# Patient Record
Sex: Male | Born: 1965 | Race: White | Hispanic: No | Smoking: Never smoker
Health system: Southern US, Community
[De-identification: ages and names within clinical notes are randomized; demographics above are authoritative.]

## PROBLEM LIST (undated history)

## (undated) DIAGNOSIS — M1990 Unspecified osteoarthritis, unspecified site: Secondary | ICD-10-CM

## (undated) DIAGNOSIS — K219 Gastro-esophageal reflux disease without esophagitis: Secondary | ICD-10-CM

## (undated) DIAGNOSIS — T4145XA Adverse effect of unspecified anesthetic, initial encounter: Secondary | ICD-10-CM

## (undated) DIAGNOSIS — G4733 Obstructive sleep apnea (adult) (pediatric): Principal | ICD-10-CM

## (undated) DIAGNOSIS — T7840XA Allergy, unspecified, initial encounter: Secondary | ICD-10-CM

## (undated) DIAGNOSIS — Z9989 Dependence on other enabling machines and devices: Principal | ICD-10-CM

## (undated) DIAGNOSIS — G5600 Carpal tunnel syndrome, unspecified upper limb: Secondary | ICD-10-CM

## (undated) DIAGNOSIS — R0683 Snoring: Secondary | ICD-10-CM

## (undated) DIAGNOSIS — Z1509 Genetic susceptibility to other malignant neoplasm: Secondary | ICD-10-CM

## (undated) HISTORY — PX: GALLBLADDER SURGERY: SHX652

## (undated) HISTORY — PX: ANAL FISSURE REPAIR: SHX2312

## (undated) HISTORY — DX: Dependence on other enabling machines and devices: Z99.89

## (undated) HISTORY — DX: Obstructive sleep apnea (adult) (pediatric): G47.33

## (undated) HISTORY — PX: FRACTURE SURGERY: SHX138

## (undated) HISTORY — DX: Carpal tunnel syndrome, unspecified upper limb: G56.00

## (undated) HISTORY — DX: Allergy, unspecified, initial encounter: T78.40XA

## (undated) HISTORY — DX: Genetic susceptibility to other malignant neoplasm: Z15.09

## (undated) HISTORY — DX: Snoring: R06.83

---

## 1997-08-05 HISTORY — PX: CHOLECYSTECTOMY: SHX55

## 2003-08-06 HISTORY — PX: EYE SURGERY: SHX253

## 2003-10-10 ENCOUNTER — Ambulatory Visit (HOSPITAL_COMMUNITY): Admission: RE | Admit: 2003-10-10 | Discharge: 2003-10-10 | Payer: Self-pay | Admitting: Internal Medicine

## 2005-01-19 ENCOUNTER — Ambulatory Visit (HOSPITAL_COMMUNITY): Admission: RE | Admit: 2005-01-19 | Discharge: 2005-01-19 | Payer: Self-pay | Admitting: Family Medicine

## 2005-03-28 ENCOUNTER — Emergency Department (HOSPITAL_COMMUNITY): Admission: EM | Admit: 2005-03-28 | Discharge: 2005-03-28 | Payer: Self-pay | Admitting: *Deleted

## 2006-07-14 ENCOUNTER — Ambulatory Visit (HOSPITAL_COMMUNITY): Admission: RE | Admit: 2006-07-14 | Discharge: 2006-07-14 | Payer: Self-pay | Admitting: General Surgery

## 2007-03-04 ENCOUNTER — Ambulatory Visit (HOSPITAL_COMMUNITY): Admission: RE | Admit: 2007-03-04 | Discharge: 2007-03-04 | Payer: Self-pay | Admitting: Family Medicine

## 2008-02-29 ENCOUNTER — Emergency Department (HOSPITAL_COMMUNITY): Admission: EM | Admit: 2008-02-29 | Discharge: 2008-02-29 | Payer: Self-pay | Admitting: Emergency Medicine

## 2008-04-29 ENCOUNTER — Emergency Department (HOSPITAL_COMMUNITY): Admission: EM | Admit: 2008-04-29 | Discharge: 2008-04-29 | Payer: Self-pay | Admitting: Emergency Medicine

## 2009-06-11 ENCOUNTER — Emergency Department (HOSPITAL_COMMUNITY): Admission: EM | Admit: 2009-06-11 | Discharge: 2009-06-11 | Payer: Self-pay | Admitting: Emergency Medicine

## 2010-06-04 ENCOUNTER — Ambulatory Visit (HOSPITAL_COMMUNITY): Admission: RE | Admit: 2010-06-04 | Discharge: 2010-06-04 | Payer: Self-pay | Admitting: Internal Medicine

## 2010-08-26 ENCOUNTER — Encounter: Payer: Self-pay | Admitting: Family Medicine

## 2010-12-21 NOTE — Op Note (Signed)
Jacob House, Jacob House                           ACCOUNT NO.:  [id number]   MEDICAL RECORD NO.:  [id number]                   PATIENT TYPE:  AMB   LOCATION:  DAY                                  FACILITY:  [facility]   PHYSICIAN:  Lionel December, M.D.                 DATE OF BIRTH:  November 06, 1965   DATE OF PROCEDURE:  10/10/2003  DATE OF DISCHARGE:                                 OPERATIVE REPORT   PROCEDURE:  Total colonoscopy.   INDICATIONS FOR PROCEDURE:  Jacob House is a 45 year old Caucasian male who had  a small tubular adenoma removed on his previous colonoscopy in November of  1997.  He does not have any GI symptoms other than occasional hematochezia  in the form of blood on the tissue felt to be secondary to hemorrhoids.  His  family history is strongly positive for colon carcinoma.  He had mother and  a paternal uncle with colon cancer in their 72s.  He is therefore at high  risk for CRC.   The procedure and risks were reviewed with the patient, and informed consent  was obtained.   PREOPERATIVE MEDICATIONS:  Demerol 50 mg IV, Versed 13 mg IV.   FINDINGS:  The procedure was performed in the endoscopy suite.  The  patient's vital signs and O2 saturations were monitored during the procedure  and remained stable.  The patient was placed in the left lateral recumbent  position and rectal examination performed.  No abnormality noted on external  or digital exam.  The Olympus videoscope was placed into the rectum and  advanced into the region of the sigmoid colon and beyond.  The preparation  was excellent, except he had some stool coating the cecal mucosa which had  to be washed away.  There was a small polyp to the right of the appendiceal  orifice which was ablated by cold biopsy.  The ileocecal valve was well-seen  and was normal.  As the scope was withdrawn, the colonic mucosa was once  again carefully examined and was normal throughout.  The rectal mucosa  similarly was normal.  The  scope was retroflexed to examine the anorectal  junction, and small hemorrhoids were noted below the dentate line and a  prominent anal papilla.  The endoscope was straightened and withdrawn.  The  patient tolerated the procedure well.   FINAL DIAGNOSES:  1. Small cecal polyp that was ablated by cold biopsy.  2. Small external hemorrhoids with prominent anal papilla.   RECOMMENDATIONS:  1. Standard instructions given.  2. I will be contacting Leonette Most with the biopsy results.  Given his personal     history and family history of colon carcinoma, I would recommend that he     should return for repeat colonoscopy in five years from now.      ___________________________________________  Lionel December, M.D.   NR/MEDQ  D:  10/10/2003  T:  10/10/2003  Job:  [id number]   cc:   Corrie Mckusick, M.D.  [address] [place]urell Josephs. [place]  [place]  [place] [address]  Fax: [phone]

## 2010-12-21 NOTE — Op Note (Signed)
NAMEMACDONALD, RIGOR NO.:  [id number]   MEDICAL RECORD NO.:  [id number]          PATIENT TYPE:  AMB   LOCATION:  SDS                          FACILITY:  [facility]   PHYSICIAN:  Angelia Mould. Derrell Lolling, M.D.DATE OF BIRTH:  04-07-1966   DATE OF PROCEDURE:  07/14/2006  DATE OF DISCHARGE:                               OPERATIVE REPORT   PREOPERATIVE DIAGNOSIS:  Chronic anal fissure.   POSTOPERATIVE DIAGNOSES:  1. Chronic anal fissure.  2. Pruritus ani.   OPERATION PERFORMED:  1. Rigid proctoscopy.  2. Lateral internal sphincterotomy.  3. Anal stretch.   SURGEON:  Angelia Mould. Derrell Lolling, M.D.   OPERATIVE INDICATIONS:  This is a 45 year old white man who has a long  history of intermittent rectal pain and low-volume bright red bleeding.  His pain is typically during and right after a bowel movement with a  little bit of blood.  He had a colonoscopy in 2005 and he thinks he was  told he had polyps and is supposed to get another colonoscopy in about a  year.  On exam, there are no hemorrhoids.  He had a very minor pruritus  ani.  I could visualize a small anal fissure posterior midline and  probably a smaller one anterior and midline, no sentinel tags.  The we  have attempted medical therapy with stool softeners, perianal hygiene,  diltiazem cream  q.i.d., and he continues to be symptomatic.  The  pruritus ani was improved, but the pain and the bleeding did not.  We  talked about surgical options and he wanted to try for repair.  He has  been counseled as an outpatient.   OPERATIVE TECHNIQUE:  Following the induction of general LMA anesthetic,  the patient was placed in the dorsal lithotomy position.  Digital rectal  exam revealed normal to slightly increased sphincter tone.  He still had  some residual pruritus ani.  There was no mass.  The perianal tissues  were a little bit friable and bled and tore easily.  Rigid proctoscopy  was carried out to about 20 cm.  I saw no  abnormalities of the rectal  mucosa.  Specifically no polyps, no tumors, no inflammatory change.  The  colon was acutely angulated at 20 cm and I did not go further.   The perianal area was then prepped and draped in a sterile fashion.  External exam again confirmed mild circumferential pruritus ani and  small anal fissure anterior midline posterior midline.  Anoscope was  inserted and there were no other anal canal lesions.  I positioned the  anoscope to expose the left lateral wall.  I was then able to palpate  the intersphincteric groove between the internal and external sphincter  muscles.  I then took an 11 knife blade and inserted it percutaneously  through the anoderm in between the internal and external sphincter and  then turned the knife and divided the internal sphincter.  I left the  mucosa intact.  I removed the knife.  I then slowly dilated the anal  canal until I could insert four fingers.  I held  pressure on the  sphincterotomy site for about 5-10 minutes and this provided good  hemostasis and no hematoma was apparent.  I used the cautery to  cauterize the anal  fissure in the posterior and anterior midline.  I then observed for  about another 5 minutes and saw no bleeding, placed an external bandage.  The patient was taken to the recovery room in stable condition.  Estimated blood loss was about 10 mL.  Complications:  None.  Sponge,  needle and instrument counts were correct.      Angelia Mould. Derrell Lolling, M.D.  Electronically Signed     HMI/MEDQ  D:  07/14/2006  T:  07/14/2006  Job:  [id number]   cc:   Corrie Mckusick, M.D.  Lionel December, M.D.

## 2011-05-03 LAB — POCT I-STAT, CHEM 8
HCT: 42
Hemoglobin: 14.3
Potassium: 4
Sodium: 136
TCO2: 26

## 2011-08-17 ENCOUNTER — Ambulatory Visit (INDEPENDENT_AMBULATORY_CARE_PROVIDER_SITE_OTHER): Payer: BC Managed Care – PPO

## 2011-08-17 DIAGNOSIS — M7710 Lateral epicondylitis, unspecified elbow: Secondary | ICD-10-CM

## 2012-03-28 ENCOUNTER — Ambulatory Visit (INDEPENDENT_AMBULATORY_CARE_PROVIDER_SITE_OTHER): Payer: BC Managed Care – PPO | Admitting: Family Medicine

## 2012-03-28 VITALS — BP 114/74 | HR 72 | Temp 98.0°F | Resp 18 | Ht 66.5 in | Wt 237.0 lb

## 2012-03-28 DIAGNOSIS — M79605 Pain in left leg: Secondary | ICD-10-CM

## 2012-03-28 DIAGNOSIS — M545 Low back pain, unspecified: Secondary | ICD-10-CM

## 2012-03-28 MED ORDER — OXAPROZIN 600 MG PO TABS: ORAL_TABLET | ORAL | Status: DC

## 2012-03-28 MED ORDER — PREDNISONE 20 MG PO TABS: ORAL_TABLET | ORAL | Status: AC

## 2012-03-28 MED ORDER — METHOCARBAMOL 750 MG PO TABS: ORAL_TABLET | ORAL | Status: DC

## 2012-03-28 NOTE — Progress Notes (Signed)
Subjective: Patient has a long history of episodic low back pain. He was last treated for it about 8 months ago. It calmed down fairly quickly at that time. The past couple days he's had pain in his left low back, at times radiating down his left leg. No specific injury, though he did do a lot of stairs on Thursday.  Objective: Overweight white male in no acute distress. Abdomen soft. Straight leg raise test negative. Fair range of motion of his back.  Assessment Low back pain with left radiculopathy  Plan: Prednisone taper Daypro twice a day Robaxin 1 twice a day and 2 at bedtime Return if worse

## 2012-03-28 NOTE — Patient Instructions (Signed)
Take medications as directed  Return if problems or not doing better or getting worse.

## 2012-06-12 ENCOUNTER — Ambulatory Visit (INDEPENDENT_AMBULATORY_CARE_PROVIDER_SITE_OTHER): Payer: BC Managed Care – PPO | Admitting: Family Medicine

## 2012-06-12 ENCOUNTER — Ambulatory Visit: Payer: BC Managed Care – PPO

## 2012-06-12 VITALS — BP 129/77 | HR 71 | Temp 97.9°F | Resp 16 | Ht 67.0 in | Wt 239.0 lb

## 2012-06-12 DIAGNOSIS — M79602 Pain in left arm: Secondary | ICD-10-CM

## 2012-06-12 DIAGNOSIS — G5600 Carpal tunnel syndrome, unspecified upper limb: Secondary | ICD-10-CM

## 2012-06-12 DIAGNOSIS — M79609 Pain in unspecified limb: Secondary | ICD-10-CM

## 2012-06-12 DIAGNOSIS — T148XXA Other injury of unspecified body region, initial encounter: Secondary | ICD-10-CM

## 2012-06-12 MED ORDER — METHOCARBAMOL 750 MG PO TABS: ORAL_TABLET | ORAL | Status: DC

## 2012-06-12 MED ORDER — OXAPROZIN 600 MG PO TABS: ORAL_TABLET | ORAL | Status: DC

## 2012-06-12 NOTE — Progress Notes (Signed)
[facility]:  Office Visit  Chief Complaint:  Chief Complaint  Patient presents with  . Arm Pain    left arm pain x 3-4 days  . Hand Problem    right hand numbness x a couple weeks    HPI: Jacob House is a 46 y.o. male who complains of  Left arm pain above and below elbow joint. It is described as mostly aching and throbbing, however can be sharp at times for 4 days. He denies any shoulder issues, he sleeps on both sides and has not had any problems before this. The discomfort makes it hard to sleep. He has NKI, has not lifted anything heavy, has not tried any new exercises. He has full ROM, denies any weakness. He does have a history of carpal tunnel bilaterally , obtained through nerve conduction studies,  with numbness mostly  in right index finger. Has tried robaxin with some relief. 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 Patient wants to know if he can get xrays.   Past Medical History  Diagnosis Date  . Carpal tunnel syndrome    Past Surgical History  Procedure Date  . Eye surgery   . Gallbladder surgery   . Anal fissure repair    History   Social History  . Marital Status: Single    Spouse Name: N/A    Number of Children: N/A  . Years of Education: N/A   Occupational History  . machine operator    Social History Main Topics  . Smoking status: Never Smoker   . Smokeless tobacco: None  . Alcohol Use: 0.5 - 1.0 oz/week    1-2 drink(s) per week  . Drug Use: No  . Sexually Active: Yes    Birth Control/ Protection: Condom     Comment: 1 sexual partner in last year   Other Topics Concern  . None   Social History Narrative  . None   Family History  Problem Relation Age of Onset  . Cancer Mother   . Cancer Father   . Cancer Maternal Grandmother   . Cancer Maternal Grandfather    No Known Allergies Prior to Admission medications   Medication Sig Start Date End  Date Taking? Authorizing Provider  methocarbamol (ROBAXIN-750) 750 MG tablet One twice daily morning and afternoon and 2 at bedtime for muscle relaxant 03/28/12   Peyton Najjar, MD  oxaprozin (DAYPRO) 600 MG tablet One twice daily for back 03/28/12 03/28/13  Peyton Najjar, MD     ROS: The patient denies fevers, chills, night sweats, unintentional weight loss, chest pain, palpitations, wheezing, dyspnea on exertion, nausea, vomiting, abdominal pain, dysuria, hematuria, melena, + numbness, or tingling.   All other systems have been reviewed and were otherwise negative with the exception of those mentioned in the HPI and as above.    PHYSICAL EXAM: Filed Vitals:   06/12/12 1552  BP: 129/77  Pulse: 71  Temp: 97.9 F (36.6 C)  Resp: 16   Filed Vitals:   06/12/12 1552  Height: 5\' 7"  (1.702 m)  Weight: 239 lb (108.41 kg)   Body mass index is 37.43 kg/(m^2).  General: Alert, no acute distress, very pleasant HEENT:  Normocephalic, atraumatic, oropharynx patent.  Cardiovascular:  Regular rate and rhythm, no rubs murmurs or gallops.  No Carotid bruits, radial pulse intact. No pedal edema.  Respiratory: Clear to auscultation bilaterally.  No wheezes, rales, or rhonchi.  No cyanosis, no use of accessory musculature GI: No organomegaly, abdomen is soft and non-tender, positive bowel sounds.  No masses. Skin: No rashes. Neurologic: Facial musculature symmetric. Psychiatric: Patient is appropriate throughout our interaction. Lymphatic: No cervical lymphadenopathy Musculoskeletal: Gait intact. Neck ROM -nl, negative Spurling Right hand-nl ROM, 5/5 strength,  + right index finger decrease sensation to light touch, pin prick Left shoulder-nl exam Left humerus and forearm-  Full ROM, 5/5 strength, + radial pulse, no deformities, stable to varus and valgus stress at elbow joint, + Tender with palpation at mid left humerus, proximal left forearm Left elbow-nl exam, + DTR, 2/2 , 5/5 stregnth, ROM  intact, neg Tinel's test   LABS: Results for orders placed during the hospital encounter of 02/29/08  POCT I-STAT, CHEM 8      Component Value Range   Sodium 136     Potassium 4.0     Chloride 102     BUN 9     Creatinine, Ser 1.0     Glucose, Bld 122 (*)    Calcium, Ion 1.10 (*)    TCO2 26     Hemoglobin 14.3     HCT 42.0       EKG/XRAY:   Primary read interpreted by Dr. Conley Rolls at [facility]. No fractures or subluxation of radius, ulna, humerus, elbow   ASSESSMENT/PLAN: Encounter Diagnoses  Name Primary?  . Arm pain, left Yes  . Carpal tunnel syndrome   . Sprain and strain    Muscle sprain/strain ? Associated with carpal tunnel syndrome.  Robaxin, Daypro  rx since they both seem to help Wrist splints prn bilaterally prn, he will get some from work or OTC, advise to try splints at night  F/u prn    Kayci Belleville PHUONG, DO 06/13/2012 8:08 AM

## 2012-06-13 DIAGNOSIS — G5600 Carpal tunnel syndrome, unspecified upper limb: Secondary | ICD-10-CM | POA: Insufficient documentation

## 2012-08-06 ENCOUNTER — Ambulatory Visit (INDEPENDENT_AMBULATORY_CARE_PROVIDER_SITE_OTHER): Payer: BC Managed Care – PPO | Admitting: Physician Assistant

## 2012-08-06 VITALS — BP 122/72 | HR 82 | Temp 97.9°F | Resp 16 | Ht 67.0 in | Wt 240.4 lb

## 2012-08-06 DIAGNOSIS — L02519 Cutaneous abscess of unspecified hand: Secondary | ICD-10-CM

## 2012-08-06 DIAGNOSIS — K635 Polyp of colon: Secondary | ICD-10-CM | POA: Insufficient documentation

## 2012-08-06 DIAGNOSIS — L03019 Cellulitis of unspecified finger: Secondary | ICD-10-CM

## 2012-08-06 DIAGNOSIS — M79609 Pain in unspecified limb: Secondary | ICD-10-CM

## 2012-08-06 MED ORDER — DOXYCYCLINE HYCLATE 100 MG PO CAPS: 100.0000 mg | ORAL_CAPSULE | Freq: Two times a day (BID) | ORAL | Status: DC

## 2012-08-06 NOTE — Patient Instructions (Signed)
Warm compresses for 15-20 minutes, 3-4 times daily.

## 2012-08-06 NOTE — Progress Notes (Signed)
  Subjective:    Patient ID: Jacob House, male    DOB: 14-Jan-1966, 47 y.o.   MRN: [id number]  HPI This 47 y.o. male presents for evaluation of RIGHT middle finger pain and swelling.  First began about 2 weeks ago, seemed to improve, then worsened again about 4 days ago. No drainage from the site.  No specific trauma or injury.  Review of Systems No fever, chills.    Objective:   Physical Exam  Vitals reviewed. Constitutional: He is oriented to person, place, and time. Vital signs are normal. He appears well-developed and well-nourished. He is active and cooperative. No distress.  HENT:  Head: Normocephalic and atraumatic.  Eyes: No scleral icterus.  Cardiovascular: Normal rate.   Pulmonary/Chest: Effort normal.  Musculoskeletal: He exhibits edema and tenderness.       Hands: Neurological: He is alert and oriented to person, place, and time.  Skin: Skin is warm and dry. No rash noted. He is not diaphoretic. There is erythema. No cyanosis. Nails show no clubbing.       RIGHT middle finger noted for distal erythema and edema along the nail fold (radial aspect).  Cuticles are very dry and cracked.  Possible fluctuance, no purulence can be expressed.  Psychiatric: He has a normal mood and affect. His behavior is normal.   PROCEDURE: VCO.  Area cleansed with alcohol pad. 23 gauge needle inserted into the center of the swollen area.  No purulence aspirated or expressed.  Antibiotic ointment and bandaid applied.     Assessment & Plan:   1. Cellulitis, finger  doxycycline (VIBRAMYCIN) 100 MG capsule   Warm compresses.  Anticipatory guidance provided.  RTC if worsens/persists.

## 2012-09-12 ENCOUNTER — Ambulatory Visit (INDEPENDENT_AMBULATORY_CARE_PROVIDER_SITE_OTHER): Payer: BC Managed Care – PPO | Admitting: Emergency Medicine

## 2012-09-12 ENCOUNTER — Encounter: Payer: Self-pay | Admitting: Emergency Medicine

## 2012-09-12 VITALS — BP 117/77 | HR 81 | Temp 98.0°F | Resp 16 | Ht 66.5 in | Wt 234.4 lb

## 2012-09-12 DIAGNOSIS — G5600 Carpal tunnel syndrome, unspecified upper limb: Secondary | ICD-10-CM

## 2012-09-12 DIAGNOSIS — R079 Chest pain, unspecified: Secondary | ICD-10-CM

## 2012-09-12 DIAGNOSIS — G5603 Carpal tunnel syndrome, bilateral upper limbs: Secondary | ICD-10-CM

## 2012-09-12 MED ORDER — NAPROXEN SODIUM 550 MG PO TABS: 550.0000 mg | ORAL_TABLET | Freq: Two times a day (BID) | ORAL | Status: DC

## 2012-09-12 NOTE — Progress Notes (Signed)
[facility] [address] [place] [address] [phone]  Date:  09/12/2012   Name:  Jacob House   DOB:  October 28, 1965   MRN:  [id number]  PCP:  No primary provider on file.    Chief Complaint: Elbow Pain   History of Present Illness:  Jacob House is a 47 y.o. very pleasant male patient who presents with the following:  History of carpal tunnel syndrome and not using any splint or meds for treatment.  Does not wish treatment for it.  Now has pain into forearm while sleeping associated with numbness in arm.  No weakness in arm.  Experiencing mid chest discomfort over past two weeks or so that is characterized as a tightness.  Lasts a minute or two. Never more.  Not associated with activity or emotional upset.  No palpitations or tachycardia, wheezing, shortness of breath, diaphoresis, nausea, radiation or other complaints.  Pain free now.  Non smoker, no hypertension, hypercholesterolemia, diabetes, or family history of CAD.   Patient Active Problem List  Diagnosis  . Carpal tunnel syndrome  . Colon polyps    Past Medical History  Diagnosis Date  . Carpal tunnel syndrome     Past Surgical History  Procedure Laterality Date  . Eye surgery    . Gallbladder surgery    . Anal fissure repair      History  Substance Use Topics  . Smoking status: Never Smoker   . Smokeless tobacco: Never Used  . Alcohol Use: 0 - .5 oz/week    0-1 drink(s) per week    Family History  Problem Relation Age of Onset  . Cancer Mother 57    colon, stomach  . Cancer Father     testicular  . Cancer Maternal Grandmother     ?colon  . Cancer Maternal Grandfather   . Cancer Maternal Uncle     colon  . Cancer Maternal Uncle     colon    No Known Allergies  Medication list has been reviewed and updated.  Current Outpatient Prescriptions on File Prior to Visit  Medication Sig Dispense Refill  . Diclofenac Sodium (PENNSAID) 1.5 % SOLN Place onto the skin.      Marland Kitchen  doxycycline (VIBRAMYCIN) 100 MG capsule Take 1 capsule (100 mg total) by mouth 2 (two) times daily.  20 capsule  0  . methocarbamol (ROBAXIN-750) 750 MG tablet One tab PO BID  30 tablet  1  . oxaprozin (DAYPRO) 600 MG tablet One twice daily for back  30 tablet  1   No current facility-administered medications on file prior to visit.    Review of Systems:  As per HPI, otherwise negative.   Physical Examination: Filed Vitals:   09/12/12 1812  BP: 117/77  Pulse: 81  Temp: 98 F (36.7 C)  Resp: 16   Filed Vitals:   09/12/12 1812  Height: 5' 6.5" (1.689 m)  Weight: 234 lb 6.4 oz (106.323 kg)   Body mass index is 37.27 kg/(m^2). Ideal Body Weight: Weight in (lb) to have BMI = 25: 156.9  GEN: WDWN, NAD, Non-toxic, A & O x 3 HEENT: Atraumatic, Normocephalic. Neck supple. No masses, No LAD. Ears and Nose: No external deformity. CV: RRR, No M/G/R. No JVD. No thrill. No extra heart sounds. PULM: CTA B, no wheezes, crackles, rhonchi. No retractions. No resp. distress. No accessory muscle use. ABD: S, NT, ND, +BS. No rebound. No HSM. EXTR: No c/c/e  Tinnell positive NEURO Normal  gait.  PSYCH: Normally interactive. Conversant. Not depressed or anxious appearing.  Calm demeanor.     Assessment and Plan:  Carpal tunnel syndrome Wear splint Consider surgery Anaprox DS Chest discomfort. Normal EKG Follow up with FMD  Carmelina Dane, MD

## 2012-09-12 NOTE — Patient Instructions (Addendum)

## 2012-09-29 ENCOUNTER — Emergency Department (HOSPITAL_COMMUNITY)
Admission: EM | Admit: 2012-09-29 | Discharge: 2012-09-30 | Disposition: A | Discharge status: Home / Self Care | Payer: BC Managed Care – PPO | Attending: Emergency Medicine | Admitting: Emergency Medicine

## 2012-09-29 ENCOUNTER — Emergency Department (HOSPITAL_COMMUNITY): Payer: BC Managed Care – PPO

## 2012-09-29 ENCOUNTER — Encounter (HOSPITAL_COMMUNITY): Payer: Self-pay | Admitting: *Deleted

## 2012-09-29 DIAGNOSIS — Z8669 Personal history of other diseases of the nervous system and sense organs: Secondary | ICD-10-CM | POA: Insufficient documentation

## 2012-09-29 DIAGNOSIS — Z79899 Other long term (current) drug therapy: Secondary | ICD-10-CM | POA: Insufficient documentation

## 2012-09-29 DIAGNOSIS — R079 Chest pain, unspecified: Secondary | ICD-10-CM | POA: Insufficient documentation

## 2012-09-29 LAB — CBC
HCT: 41.8 % (ref 39.0–52.0)
Hemoglobin: 14.3 g/dL (ref 13.0–17.0)
MCH: 28.4 pg (ref 26.0–34.0)
MCHC: 34.2 g/dL (ref 30.0–36.0)
MCV: 83.1 fL (ref 78.0–100.0)
Platelets: 281 10*3/uL (ref 150–400)
RBC: 5.03 MIL/uL (ref 4.22–5.81)
RDW: 13 % (ref 11.5–15.5)
WBC: 6.9 10*3/uL (ref 4.0–10.5)

## 2012-09-29 LAB — BASIC METABOLIC PANEL
BUN: 11 mg/dL (ref 6–23)
CO2: 28 mEq/L (ref 19–32)
Calcium: 9.6 mg/dL (ref 8.4–10.5)
Chloride: 100 mEq/L (ref 96–112)
Creatinine, Ser: 0.95 mg/dL (ref 0.50–1.35)
GFR calc Af Amer: >90 mL/min (ref >90)
GFR calc non Af Amer: >90 mL/min (ref >90)
Glucose, Bld: 112 mg/dL — ABNORMAL HIGH (ref 70–99)
Potassium: 3.9 mEq/L (ref 3.5–5.1)
Sodium: 138 mEq/L (ref 135–145)

## 2012-09-29 LAB — TROPONIN I: Troponin I: <0.3 ng/mL (ref <0.30)

## 2012-09-29 MED ORDER — NITROGLYCERIN 0.4 MG SL SUBL
0.4000 mg | SUBLINGUAL_TABLET | SUBLINGUAL | Status: DC | PRN
  Administered 2012-09-29: 0.4 mg via SUBLINGUAL
  Filled 2012-09-29: qty 25

## 2012-09-29 MED ORDER — GI COCKTAIL ~~LOC~~
30.0000 mL | Freq: Once | ORAL | Status: AC
  Administered 2012-09-29: 30 mL via ORAL
  Filled 2012-09-29: qty 30

## 2012-09-29 NOTE — ED Notes (Addendum)
Pt reports chest pain that stated around 1830 tonight while at work. Pain also to the lower part of left arm. Was seen in [place] for the same a few weeks ago.

## 2012-09-29 NOTE — ED Provider Notes (Signed)
History     CSN: [id number]  Arrival date & time 09/29/12  2055   First MD Initiated Contact with Patient 09/29/12 2100      Chief Complaint  Patient presents with  . Chest Pain    (Consider location/radiation/quality/duration/timing/severity/associated sxs/prior treatment) Patient is a 47 y.o. male presenting with chest pain. The history is provided by the patient.  Chest Pain Pain location:  Substernal area Pain quality: dull   Pain radiates to:  Does not radiate Pain radiates to the back: no   Pain severity:  Mild Onset quality:  Sudden Duration:  3 hours Timing:  Intermittent Progression:  Improving Chronicity:  Recurrent (had once previously, informed to get an outpatient stress, which he has not gotten yet) Context: not breathing, no drug use, no stress and no trauma   Relieved by:  Nothing Worsened by:  Nothing tried Associated symptoms: no abdominal pain, no back pain, no cough, no fever, no heartburn, no nausea and no weakness   Risk factors: male sex   Risk factors: no diabetes mellitus, no high cholesterol, no hypertension, no immobilization, no Marfan's syndrome, no prior DVT/PE and no smoking     Past Medical History  Diagnosis Date  . Carpal tunnel syndrome     Past Surgical History  Procedure Laterality Date  . Eye surgery    . Gallbladder surgery    . Anal fissure repair      Family History  Problem Relation Age of Onset  . Cancer Mother 67    colon, stomach  . Cancer Father     testicular  . Cancer Maternal Grandmother     ?colon  . Cancer Maternal Grandfather   . Cancer Maternal Uncle     colon  . Cancer Maternal Uncle     colon    History  Substance Use Topics  . Smoking status: Never Smoker   . Smokeless tobacco: Never Used  . Alcohol Use: 0 - .5 oz/week    0-1 drink(s) per week      Review of Systems  Constitutional: Negative for fever.  Respiratory: Negative for cough.   Cardiovascular: Positive for chest pain.   Gastrointestinal: Negative for heartburn, nausea and abdominal pain.  Musculoskeletal: Negative for back pain.  Neurological: Negative for weakness.  All other systems reviewed and are negative.    Allergies  Review of patient's allergies indicates no known allergies.  Home Medications   Current Outpatient Rx  Name  Route  Sig  Dispense  Refill  . Diclofenac Sodium (PENNSAID) 1.5 % SOLN   Transdermal   Place onto the skin.         Marland Kitchen doxycycline (VIBRAMYCIN) 100 MG capsule   Oral   Take 1 capsule (100 mg total) by mouth 2 (two) times daily.   20 capsule   0   . methocarbamol (ROBAXIN-750) 750 MG tablet      One tab PO BID   30 tablet   1   . naproxen sodium (ANAPROX DS) 550 MG tablet   Oral   Take 1 tablet (550 mg total) by mouth 2 (two) times daily with a meal.   60 tablet   2   . oxaprozin (DAYPRO) 600 MG tablet      One twice daily for back   30 tablet   1     BP 136/69  Pulse 84  Temp(Src) 98.1 F (36.7 C) (Oral)  Resp 18  Ht 5\' 7"  (1.702 m)  Wt 235 lb (  106.595 kg)  BMI 36.8 kg/m2  SpO2 98%  Physical Exam  Nursing note and vitals reviewed. Constitutional: He is oriented to person, place, and time. He appears well-developed and well-nourished. No distress.  HENT:  Head: Normocephalic and atraumatic.  Mouth/Throat: No oropharyngeal exudate.  Eyes: EOM are normal. Pupils are equal, round, and reactive to light.  Neck: Normal range of motion. Neck supple.  Cardiovascular: Normal rate and regular rhythm.  Exam reveals no friction rub.   No murmur heard. Pulmonary/Chest: Effort normal and breath sounds normal. No respiratory distress. He has no wheezes. He has no rales.  Abdominal: He exhibits no distension. There is no tenderness. There is no rebound.  Musculoskeletal: Normal range of motion. He exhibits no edema.  Neurological: He is alert and oriented to person, place, and time.  Skin: He is not diaphoretic.    ED Course  Procedures  (including critical care time)  Labs Reviewed  BASIC METABOLIC PANEL - Abnormal; Notable for the following:    Glucose, Bld 112 (*)    All other components within normal limits  CBC  TROPONIN I  TROPONIN I   Dg Chest 2 View  09/29/2012  *RADIOLOGY REPORT*  Clinical Data: Chest pain  CHEST - 2 VIEW  Comparison: 03/04/2007  Findings: The cardiomediastinal silhouette is unremarkable. The lungs are clear. There is no evidence of focal airspace disease, pulmonary edema, suspicious pulmonary nodule/mass, pleural effusion, or pneumothorax. No acute bony abnormalities are identified.  IMPRESSION: No evidence of active cardiopulmonary disease.   Original Report Authenticated By: Harmon Pier, M.D.      1. Chest pain      Date: 09/29/2012  Rate: 84  Rhythm: normal sinus rhythm  QRS Axis: normal  Intervals: normal  ST/T Wave abnormalities: normal  Conduction Disutrbances:none  Narrative Interpretation:   Old EKG Reviewed: unchanged    MDM   Patient is a 47 year old male who presents with chest pain. Began while he was at work, but he is not doing anything stressful or exertional. It's a dull, nonradiating  Ache in the center of his chest. States it's 3/10. No alleviating or exacerbating factors. He has been intermittent. It does not radiate to his back. No associated nausea, shortness of breath, vomiting. He had this previously, and was told he needed a stress test.  Vitals are stable here. Exam is benign. EKG is normal sinus rhythm without any ischemic changes. He took an aspirin at work about 30 minutes ago. I will check a delta troponin and other basic labs. I will give him nitroglycerin and a GI cocktail.  He is PERC negative.  I feel he is low risk based on no risk factors for coronary disease and young age.  Delta troponin and CXR negative. Stable for discharge. Instructed to f/u with his PCP, Dr. Phillips Odor, for a stress test. Patient understands.         Elwin Mocha, MD 09/30/12  [phone]

## 2012-09-30 LAB — TROPONIN I: Troponin I: <0.3 ng/mL (ref <0.30)

## 2012-09-30 NOTE — ED Notes (Signed)
Pt alert & oriented x4, stable gait. Patient given discharge instructions, paperwork & prescription(s). Patient  instructed to stop at the registration desk to finish any additional paperwork. Patient verbalized understanding. Pt left department w/ no further questions. 

## 2012-10-03 NOTE — ED Provider Notes (Signed)
I saw and evaluated the patient, reviewed the resident's note and I agree with the findings and plan except if noted. I personally reviewed the pt's EKG.  46yM with CP. No known CAD. On exam. NAD. CP not reproducible. Heart reg and w/o murmur. Lungs clear and no increased WOB. Skin warm and dry. Lower extremities symmetric as compared to each other. No calf tenderness. Negative Homan's. No palpable cords. EKG w/o ischemic changes trop neg x2. Doubt PE, infectious or dissection. Discussed with pt importance of FU with PCP to discuss stress testing. Emergent return precautions discussed otherwise.     Raeford Razor, MD 10/03/12 1517

## 2012-10-16 ENCOUNTER — Encounter (HOSPITAL_COMMUNITY): Payer: BC Managed Care – PPO

## 2012-10-20 ENCOUNTER — Other Ambulatory Visit (HOSPITAL_COMMUNITY): Payer: Self-pay | Admitting: Internal Medicine

## 2012-10-20 DIAGNOSIS — R0789 Other chest pain: Secondary | ICD-10-CM

## 2012-10-22 ENCOUNTER — Ambulatory Visit (HOSPITAL_COMMUNITY)
Admission: RE | Admit: 2012-10-22 | Discharge: 2012-10-22 | Disposition: A | Discharge status: Home / Self Care | Payer: BC Managed Care – PPO | Source: Ambulatory Visit | Attending: Internal Medicine | Admitting: Internal Medicine

## 2012-10-22 DIAGNOSIS — R0789 Other chest pain: Secondary | ICD-10-CM

## 2012-10-22 DIAGNOSIS — R079 Chest pain, unspecified: Secondary | ICD-10-CM | POA: Insufficient documentation

## 2012-12-31 ENCOUNTER — Ambulatory Visit (INDEPENDENT_AMBULATORY_CARE_PROVIDER_SITE_OTHER): Payer: BC Managed Care – PPO | Admitting: Family Medicine

## 2012-12-31 VITALS — BP 135/77 | HR 67 | Temp 98.1°F | Resp 16 | Ht 66.0 in | Wt 236.0 lb

## 2012-12-31 DIAGNOSIS — D179 Benign lipomatous neoplasm, unspecified: Secondary | ICD-10-CM

## 2012-12-31 DIAGNOSIS — S46819A Strain of other muscles, fascia and tendons at shoulder and upper arm level, unspecified arm, initial encounter: Secondary | ICD-10-CM

## 2012-12-31 DIAGNOSIS — S46812A Strain of other muscles, fascia and tendons at shoulder and upper arm level, left arm, initial encounter: Secondary | ICD-10-CM

## 2012-12-31 DIAGNOSIS — M7712 Lateral epicondylitis, left elbow: Secondary | ICD-10-CM

## 2012-12-31 DIAGNOSIS — S43499A Other sprain of unspecified shoulder joint, initial encounter: Secondary | ICD-10-CM

## 2012-12-31 MED ORDER — IBUPROFEN 800 MG PO TABS: 800.0000 mg | ORAL_TABLET | Freq: Three times a day (TID) | ORAL | Status: DC | PRN

## 2012-12-31 NOTE — Patient Instructions (Signed)
I think you have strained your deltoid muscle.    Use the Ibuprofen for relief.    Use the exercises below.    Rotator Surgery for Rotator Cuff Tear with Rehab Rotator cuff surgery is only recommended for individuals who have experienced persistent disability for greater than 3 months of non-surgical (conservative) treatment. Surgery is not necessary but is recommended for individuals who experience difficulty completing daily activities or athletes who are unable to compete. Rotator cuff tears do not usually heal without surgical intervention. If left alone small rotator cuff tears usually become larger. Younger athletes who have a rotator cuff tear may be recommended for surgery without attempting conservative rehabilitation. The purpose of surgery is to regain function of the shoulder joint and eliminate pain associated with the injury. In addition to repairing the tendon tear, the surgery often removes a portion of the bony roof of the shoulder (acromion) as well as the chronically thickened and inflamed membrane below the acromion (subacromial bursa). REASONS NOT TO OPERATE   Infection of the shoulder.  Inability to complete a rehabilitation program.  Patients who have other conditions (emotional or psychological) conditions that contribute to their shoulder condition. RISKS AND COMPLICATIONS  Infection.  Re-tear of the rotator cuff tendons or muscles.  Shoulder stiffness and/or weakness.  Inability to compete in athletics.  Acromioclavicular (AC) joint paint.  Risks of surgery: infection, bleeding, nerve damage, or damage to surrounding tissues. TECHNIQUE There are different surgical procedures used to treat rotator cuff tears. The type of procedure depends on the extent of injury as well as the surgeon's preference. All of the surgical techniques for rotator cuff tears have the same goal of repairing the torn tendon, removing part of the acromion, and removing the subacromial  bursa. There are two main types of procedures: arthroscopic and open incision. Arthroscopic procedures are usually completed and you go home the same day as surgery (outpatient). These procedures use multiple small incisions in which tools and a video camera are placed to work on the shoulder. An electric shaver removes the bursa, then a power burr shaves down the portion of the acromion that places pressure on the rotator cuff. Finally the rotator cuff is sewed (sutured) back to the humeral head. Open incision procedures require a larger incision. The deltoid muscle is detached from the acromion and a ligament in the shoulder (coracoacromial) is cut in order for the surgeon to access the rotator cuff. The subacromial bursa is removed as well as part of the acromion to give the rotator cuff room to move freely. The torn tendon is then sutured to the humeral head. After the rotator cuff is repaired, then the deltoid is reattached and the incision is closed up.  RECOVERY   Post-operative care depends on the surgical technique and the preferences of your therapist.  Keep the wound clean and dry for the first 10 to 14 days after surgery.  Keep your shoulder and arm in the sling provided to you for as long as you have been instructed to.  You will be given pain medications by your caregiver.  Passive (without using muscles) shoulder movements may be begun immediately after surgery.  It is important to follow through with you rehabilitation program in order to have the best possible recovery. RETURN TO SPORTS   The rehabilitation period will depend on the sport and position you play as well as the success of the operation.  The minimum recovery period is 6 months.  You must have regained  complete shoulder motion and strength before returning to sports. SEEK IMMEDIATE MEDICAL CARE IF:   Any medications produce adverse side effects.  Any complications from surgery occur:  Pain, numbness, or  coldness in the extremity operated upon.  Discoloration of the nail beds (they become blue or gray) of the extremity operated upon.  Signs of infections (fever, pain, inflammation, redness, or persistent bleeding). EXERCISES  RANGE OF MOTION (ROM) AND STRETCHING EXERCISES - Rotator Cuff Tear, Surgery For These exercises may help you restore your elbow mobility once your physician has discontinued your immobilization period. Beginning these before your provider's approval may result in delayed healing. Your symptoms may resolve with or without further involvement from your physician, physical therapist or athletic trainer. While completing these exercises, remember:   Restoring tissue flexibility helps normal motion to return to the joints. This allows healthier, less painful movement and activity.  An effective stretch should be held for at least 30 seconds. A stretch should never be painful. You should only feel a gentle lengthening or release in the stretch. ROM - Pendulum   Bend at the waist so that your right / left arm falls away from your body. Support yourself with your opposite hand on a solid surface, such as a table or a countertop.  Your right / left arm should be perpendicular to the ground. If it is not perpendicular, you need to lean over farther. Relax the muscles in your right / left arm and shoulder as much as possible.  Gently sway your hips and trunk so they move your right / left arm without any use of your right / left shoulder muscles.  Progress your movements so that your right / left arm moves side to side, then forward and backward, and finally, both clockwise and counterclockwise.  Complete __________ repetitions in each direction. Many people use this exercise to relieve discomfort in their shoulder as well as to gain range of motion. Repeat __________ times. Complete this exercise __________ times per day. STRETCH  Flexion, Seated   Sit in a firm chair so that  your right / left forearm can rest on a table or on a table or countertop. Your right / left elbow should rest below the height of your shoulder so that your shoulder feels supported and not tense or uncomfortable.  Keeping your right / left shoulder relaxed, lean forward at your waist, allowing your right / left hand to slide forward. Bend forward until you feel a moderate stretch in your shoulder, but before you feel an increase in your pain.  Hold __________ seconds. Slowly return to your starting position. Repeat __________ times. Complete this exercise __________ times per day.  STRETCH  Flexion, Standing   Stand with good posture. With an underhand grip on your right / left and an overhand grip on the opposite hand, grasp a broomstick or cane so that your hands are a little more than shoulder-width apart.  Keeping your right / left elbow straight and shoulder muscles relaxed, push the stick with your opposite hand to raise your right / left arm in front of your body and then overhead. Raise your arm until you feel a stretch in your right / left shoulder, but before you have increased shoulder pain.  Try to avoid shrugging your right / left shoulder as your arm rises by keeping your shoulder blade tucked down and toward your mid-back spine. Hold __________ seconds.  Slowly return to the starting position. Repeat __________ times. Complete this  exercise __________ times per day.  STRETCH  Abduction, Supine   Stand with good posture. With an underhand grip on your right / left and an overhand grip on the opposite hand, grasp a broomstick or cane so that your hands are a little more than shoulder-width apart.  Keeping your right / left elbow straight and shoulder muscles relaxed, push the stick with your opposite hand to raise your right / left arm out to the side of your body and then overhead. Raise your arm until you feel a stretch in your right / left shoulder, but before you have increased  shoulder pain.  Try to avoid shrugging your right / left shoulder as your arm rises by keeping your shoulder blade tucked down and toward your mid-back spine. Hold __________ seconds.  Slowly return to the starting position. Repeat __________ times. Complete this exercise __________ times per day.  ROM  Flexion, Active-Assisted  Lie on your back. You may bend your knees for comfort.  Grasp a broomstick or cane so your hands are about shoulder-width apart. Your right / left hand should grip the end of the stick/cane so that your hand is positioned "thumbs-up," as if you were about to shake hands.  Using your healthy arm to lead, raise your right / left arm overhead until you feel a gentle stretch in your shoulder. Hold __________ seconds.  Use the stick/cane to assist in returning your right / left arm to its starting position. Repeat __________ times. Complete this exercise __________ times per day.  STRETCH  External Rotation   Tuck a folded towel or small ball under your right / left upper arm. Grasp a broomstick or cane with an underhand grasp a little more than shoulder width apart. Bend your elbows to 90 degrees.  Stand with good posture or sit in a chair without arms.  Use your strong arm to push the stick across your body. Do not allow the towel or ball to fall. This will rotate your right / left arm away from your abdomen. Using the stick turn/rotate your hand and forearm away from your body. Hold __________ seconds. Repeat __________ times. Complete this exercise __________ times per day.  STRENGTHENING EXERCISES - Rotator Cuff Tear, Surgery For These exercises may help you begin to restore your elbow strength in the initial stage of your rehabilitation. Your physician will determine when you begin these exercises depending on the severity of your injury and the integrity of your repaired tissues. Beginning these before your provider's approval may result in delayed healing. While  completing these exercises, remember:   Muscles can gain both the endurance and the strength needed for everyday activities through controlled exercises.  Complete these exercises as instructed by your physician, physical therapist or athletic trainer. Progress the resistance and repetitions only as guided.  You may experience muscle soreness or fatigue, but the pain or discomfort you are trying to eliminate should never worsen during these exercises. If this pain does worsen, stop and make certain you are following the directions exactly. If the pain is still present after adjustments, discontinue the exercise until you can discuss the trouble with your clinician. STRENGTH - Shoulder Flexion, Isometric   With good posture and facing a wall, stand or sit about 4-6 inches away.  Keeping your right / left elbow straight, gently press the top of your fist into the wall. Increase the pressure gradually until you are pressing as hard as you can without shrugging your shoulder or increasing  any shoulder discomfort.  Hold __________ seconds. Release the tension slowly. Relax your shoulder muscles completely before you do the next repetition. Repeat __________ times. Complete this exercise __________ times per day.  STRENGTH - Shoulder Abductors, Isometric   With good posture, stand or sit about 4-6 inches from a wall with your right / left side facing the wall.  Bend your right / left elbow. Gently press your right / left elbow into the wall. Increase the pressure gradually until you are pressing as hard as you can without shrugging your shoulder or increasing any shoulder discomfort.  Hold __________ seconds.  Release the tension slowly. Relax your shoulder muscles completely before you do the next repetition. Repeat __________ times. Complete this exercise __________ times per day.  STRENGTH - Internal Rotators, Isometric   Keep your right / left elbow at your side and bend it 90  degrees.  Step into a door frame so that the inside of your right / left wrist can press against the door frame without your upper arm leaving your side.  Gently press your right / left wrist into the door frame as if you were trying to draw the palm of your hand to your abdomen. Gradually increase the tension until you are pressing as hard as you can without shrugging your shoulder or increasing any shoulder discomfort.  Hold __________ seconds.  Release the tension slowly. Relax your shoulder muscles completely before you do the next repetition. Repeat __________ times. Complete this exercise __________ times per day.  STRENGTH - External Rotators, Isometric   Keep your right / left elbow at your side and bend it 90 degrees.  Step into a door frame so that the outside of your right / left wrist can press against the door frame without your upper arm leaving your side.  Gently press your right / left wrist into the door frame as if you were trying to swing the back of your hand away from your abdomen. Gradually increase the tension until you are pressing as hard as you can without shrugging your shoulder or increasing any shoulder discomfort.  Hold __________ seconds.  Release the tension slowly. Relax your shoulder muscles completely before you do the next repetition. Repeat __________ times. Complete this exercise __________ times per day.  Document Released: 07/22/2005 Document Revised: 10/14/2011 Document Reviewed: 11/03/2008 [facility] Patient Information 2014 [place], [place].

## 2013-01-01 NOTE — Progress Notes (Signed)
Jacob House is a 47 y.o. male who presents to Urgent Care today with complaints of Left shoulder pain  1.  Left shoulder pain:  Present for about 1 month, maybe a little longer.  He is a right handed pleasant gentleman.  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  However he works predominantly with his Right hand.  Noted increasing aching pain alternating with sharp stabbing pain in his Left shoulder, worse at night and when sleeping but present thoguhout the day.  Progressing in intensity.  Worse with lateral or forward arm movement.  Points directly at his deltoid when asked where his pain is located.  Does little to no exercise.  No inciting triggers or trauma to his shoulder.  No redness or swelling.    PMH reviewed.  Past Medical History  Diagnosis Date  . Carpal tunnel syndrome    Past Surgical History  Procedure Laterality Date  . Eye surgery    . Gallbladder surgery    . Anal fissure repair    . Cholecystectomy      Medications reviewed. Current Outpatient Prescriptions  Medication Sig Dispense Refill  . ibuprofen (ADVIL,MOTRIN) 800 MG tablet Take 1 tablet (800 mg total) by mouth every 8 (eight) hours as needed for pain.  30 tablet  0   No current facility-administered medications for this visit.    ROS as above otherwise neg.  No chest pain, palpitations, SOB, Fever, Chills, Abd pain, N/V/D.   Physical Exam:  BP 135/77  Pulse 67  Temp(Src) 98.1 F (36.7 C)  Resp 16  Ht 5\' 6"  (1.676 m)  Wt 236 lb (107.049 kg)  BMI 38.11 kg/m2 Gen:  Alert, cooperative patient who appears stated age in no acute distress.  Vital signs reviewed. HEENT: EOMI,  MMM Back:  Nontender subcutaneous nodule consistent with lipoma on his back, circled with marker.  (His girlfriend had circled this for the doctor to see).  Also TTP along trapezius on Left side.   Ext:   RIght shoulder WNL Left shoulder:  Marked atrophy of deltoid.  No redness, edema, bruising noted.  Pain worse with lateral  movement of arm, cannot raise past about 45 degrees laterally before experiencing pain.  When coaxed, can exhibit basically full AROM/PROM, but pain is definitely reproducible.  Tender directly over deltoid.  Empty can positive.  Neer negative.   Left elbow:  Point tenderness directly over lateral epicondyle.  None over medial Chest:  TTP throughout Left pec major.  No bruisng noted.     Assessment and Plan:  1.  Deltoid strain:   - Unclear direct etiology - Ibuprofen 800 mg as pain reliever, anti-inflammatory - Provided with rehab exercises.   - I have written him out for work for next 3 days.  He will return for 2 days, then has another 5 days off due to his schedule. - FU in 2 weeks with [employer], where he is already an established patient - Believe that he has been using pec/trapezius as accessory muscles , causing soreness here since he has been obviously not using deltoid (marked atrophy)  2.  Lateral epicondylitis: - Found on exam - Ibuprofen for relief.  Otherwise he declines injection today for this.    3.  Lipoma: - No treatment necessary, he opts to have PCP remove if it becomes an issue

## 2013-04-05 HISTORY — PX: SHOULDER SURGERY: SHX246

## 2013-04-23 DIAGNOSIS — T8859XA Other complications of anesthesia, initial encounter: Secondary | ICD-10-CM

## 2013-04-23 HISTORY — DX: Other complications of anesthesia, initial encounter: T88.59XA

## 2013-07-06 ENCOUNTER — Encounter (HOSPITAL_COMMUNITY): Payer: Self-pay | Admitting: *Deleted

## 2013-07-07 ENCOUNTER — Encounter (HOSPITAL_COMMUNITY): Payer: Self-pay | Admitting: Pharmacy Technician

## 2013-07-22 ENCOUNTER — Encounter (HOSPITAL_COMMUNITY): Payer: BC Managed Care – PPO | Admitting: Anesthesiology

## 2013-07-22 ENCOUNTER — Encounter: Payer: Self-pay | Admitting: Gastroenterology

## 2013-07-22 ENCOUNTER — Encounter (HOSPITAL_COMMUNITY): Payer: Self-pay

## 2013-07-22 ENCOUNTER — Ambulatory Visit (HOSPITAL_COMMUNITY): Payer: BC Managed Care – PPO | Admitting: Anesthesiology

## 2013-07-22 ENCOUNTER — Other Ambulatory Visit: Payer: Self-pay | Admitting: Gastroenterology

## 2013-07-22 ENCOUNTER — Encounter (HOSPITAL_COMMUNITY)
Admission: RE | Disposition: A | Discharge status: Home / Self Care | Payer: Self-pay | Source: Ambulatory Visit | Attending: Gastroenterology

## 2013-07-22 ENCOUNTER — Ambulatory Visit (HOSPITAL_COMMUNITY)
Admission: RE | Admit: 2013-07-22 | Discharge: 2013-07-22 | Disposition: A | Discharge status: Home / Self Care | Payer: BC Managed Care – PPO | Source: Ambulatory Visit | Attending: Gastroenterology | Admitting: Gastroenterology

## 2013-07-22 DIAGNOSIS — K219 Gastro-esophageal reflux disease without esophagitis: Secondary | ICD-10-CM | POA: Insufficient documentation

## 2013-07-22 DIAGNOSIS — G4730 Sleep apnea, unspecified: Secondary | ICD-10-CM | POA: Insufficient documentation

## 2013-07-22 DIAGNOSIS — Z800 Family history of malignant neoplasm of digestive organs: Secondary | ICD-10-CM | POA: Insufficient documentation

## 2013-07-22 DIAGNOSIS — Z1509 Genetic susceptibility to other malignant neoplasm: Secondary | ICD-10-CM | POA: Insufficient documentation

## 2013-07-22 HISTORY — PX: COLONOSCOPY WITH PROPOFOL: SHX5780

## 2013-07-22 HISTORY — DX: Gastro-esophageal reflux disease without esophagitis: K21.9

## 2013-07-22 HISTORY — PX: ESOPHAGOGASTRODUODENOSCOPY (EGD) WITH PROPOFOL: SHX5813

## 2013-07-22 HISTORY — DX: Adverse effect of unspecified anesthetic, initial encounter: T41.45XA

## 2013-07-22 SURGERY — ESOPHAGOGASTRODUODENOSCOPY (EGD) WITH PROPOFOL
Anesthesia: Monitor Anesthesia Care

## 2013-07-22 MED ORDER — SODIUM CHLORIDE 0.9 % IV SOLN: INTRAVENOUS | Status: DC

## 2013-07-22 MED ORDER — FENTANYL CITRATE 0.05 MG/ML IJ SOLN
INTRAMUSCULAR | Status: DC | PRN
  Administered 2013-07-22: 100 ug via INTRAVENOUS

## 2013-07-22 MED ORDER — PROPOFOL 10 MG/ML IV BOLUS
INTRAVENOUS | Status: AC
  Filled 2013-07-22: qty 20

## 2013-07-22 MED ORDER — LIDOCAINE HCL (CARDIAC) 20 MG/ML IV SOLN
INTRAVENOUS | Status: AC
  Filled 2013-07-22: qty 5

## 2013-07-22 MED ORDER — FENTANYL CITRATE 0.05 MG/ML IJ SOLN: 25.0000 ug | INTRAMUSCULAR | Status: DC | PRN

## 2013-07-22 MED ORDER — MIDAZOLAM HCL 2 MG/2ML IJ SOLN
INTRAMUSCULAR | Status: AC
  Filled 2013-07-22: qty 2

## 2013-07-22 MED ORDER — PROPOFOL INFUSION 10 MG/ML OPTIME
INTRAVENOUS | Status: DC | PRN
  Administered 2013-07-22: 120 ug/kg/min via INTRAVENOUS

## 2013-07-22 MED ORDER — LACTATED RINGERS IV SOLN
INTRAVENOUS | Status: DC
  Administered 2013-07-22: 13:00:00 via INTRAVENOUS

## 2013-07-22 MED ORDER — LIDOCAINE HCL 1 % IJ SOLN
INTRAMUSCULAR | Status: DC | PRN
  Administered 2013-07-22: 50 mg via INTRADERMAL

## 2013-07-22 MED ORDER — FENTANYL CITRATE 0.05 MG/ML IJ SOLN
INTRAMUSCULAR | Status: AC
  Filled 2013-07-22: qty 2

## 2013-07-22 MED ORDER — KETAMINE HCL 10 MG/ML IJ SOLN
INTRAMUSCULAR | Status: DC | PRN
  Administered 2013-07-22 (×4): 10 mg via INTRAVENOUS

## 2013-07-22 MED ORDER — MIDAZOLAM HCL 5 MG/5ML IJ SOLN
INTRAMUSCULAR | Status: DC | PRN
  Administered 2013-07-22 (×4): 1 mg via INTRAVENOUS

## 2013-07-22 SURGICAL SUPPLY — 24 items

## 2013-07-22 NOTE — Anesthesia Postprocedure Evaluation (Signed)
  Anesthesia Post-op Note  Patient: Stefan Markarian  Procedure(s) Performed: Procedure(s) (LRB): ESOPHAGOGASTRODUODENOSCOPY (EGD) WITH PROPOFOL (N/A) COLONOSCOPY WITH PROPOFOL (N/A)  Patient Location: PACU  Anesthesia Type: MAC  Level of Consciousness: awake and alert   Airway and Oxygen Therapy: Patient Spontanous Breathing  Post-op Pain: mild  Post-op Assessment: Post-op Vital signs reviewed, Patient's Cardiovascular Status Stable, Respiratory Function Stable, Patent Airway and No signs of Nausea or vomiting  Last Vitals:  Filed Vitals:   07/22/13 1505  BP: 106/73  Temp: 36.9 C  Resp: 17    Post-op Vital Signs: stable   Complications: No apparent anesthesia complications

## 2013-07-22 NOTE — Transfer of Care (Signed)
Immediate Anesthesia Transfer of Care Note  Patient: Jacob House  Procedure(s) Performed: Procedure(s): ESOPHAGOGASTRODUODENOSCOPY (EGD) WITH PROPOFOL (N/A) COLONOSCOPY WITH PROPOFOL (N/A)  Patient Location: PACU and Endoscopy Unit  Anesthesia Type:MAC  Level of Consciousness: awake, alert , oriented and patient cooperative  Airway & Oxygen Therapy: Patient Spontanous Breathing and Patient connected to face mask oxygen  Post-op Assessment: Report given to PACU RN, Post -op Vital signs reviewed and stable and Patient moving all extremities  Post vital signs: Reviewed and stable  Complications: No apparent anesthesia complications

## 2013-07-22 NOTE — Op Note (Signed)
[facility] [address] [place], [address]   ENDOSCOPY PROCEDURE REPORT  PATIENT: Jacob House, Jacob House  MR#: [id number] BIRTHDATE: [birth year] , 46  yrs. old GENDER: Male ENDOSCOPIST:Quincey Nored, MD REFERRED BY:  Dr. Assunta Found PROCEDURE DATE:  07/22/2013 PROCEDURE:      upper endoscopy with biopsies ASA CLASS: INDICATIONS:   family history of Lynch syndrome, rule out small bowel or ampullary cancers MEDICATION:    MAC per anesthesia TOPICAL ANESTHETIC:    none  DESCRIPTION OF PROCEDURE:   the patient came as an outpatient to the [facility] long endoscopy unit. He had had recent left shoulder surgery so care was taken to have the patient reasonably comfortably positioned on his left side, slightly oblique, with extra pillows, prior to sedation. The Pentax adult video endoscope was passed under direct vision. The vocal cords were not well seen. The esophagus was entered without significant difficulty.  The esophagus had a tongue of salmon-colored mucosa possibly consistent with short segment Barrett's esophagus, so biopsies were obtained from it. Widely patent, intermittently visible esophageal mucosal ring at the squamocolumnar junction was present. The esophagus was otherwise normal.  The stomach contained no significant residual and had normal mucosa without evidence of gastritis, erosions, ulcers, polyps, or masses. A retroflexed view of the cardia was normal.  The pylorus, duodenal bulb and second duodenum looked normal. The ampullary region was observed and appeared normal.  The patient did have moderately severe desaturation with obstructive upper airway sounds during this procedure, consistent with a previous suspicion of sleep apnea.       COMPLICATIONS: None  ENDOSCOPIC IMPRESSION:  1. 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 No evidence of neoplastic lesions in the upper tract 3. Probable obstructive sleep  apnea  RECOMMENDATIONS:  1. Await pathology results 2. Proceed to colonoscopic evaluation    _______________________________ eSigned:  Bernette Redbird, MD 07/22/2013 2:57 PM    PATIENT NAME:  [name], [name] MR#: [id number]

## 2013-07-22 NOTE — Op Note (Signed)
[facility] [address] [place], [address]   COLONOSCOPY PROCEDURE REPORT  PATIENT: Daymein, Nunnery  MR#: [id number] BIRTHDATE: [birth year] , 46  yrs. old GENDER: Male ENDOSCOPIST: Bernette Redbird, MD REFERRED BY:   Dr. Assunta Found PROCEDURE DATE:  07/22/2013 PROCEDURE:     colonoscopy ASA CLASS: INDICATIONS:  strong family history of colon cancer, consistent with Lynch syndrome.colon cancer in multiple first-degree relatives over 2 generations. The patient's colonoscopy 2 years ago was normal MEDICATIONS:    MAC per anesthesia  DESCRIPTION OF PROCEDURE: the patient came as an outpatient to the [facility] long endoscopy unit and received sedation per anesthesia without overt instability although there were some challenges imposed by sleep apnea and a low pain threshold as well as medication tolerance.  Perianal exam was negative. Digital exam of the prostate area showed no suspicious findings or abnormalities. The Pentax pediatric video colonoscope was advanced easily around the colon easily to the area above the cecum, after which I had to take bowel loops and we needed some external abdominal compression to enter the base of the cecum as identified by visualization of the appendiceal orifice. Pullback was then performed. The quality of the prep was very good in its felt that all areas were adequately seen during this examination.  The terminal ileum was entered for a short distance and appeared normal.  The colon was normal. No polyps, cancer, masses, colitis, vascular ectasia, or diverticular disease were noted.  I attempted retroflexion in the rectum but it could not be accomplished because of a small rectal ampulla, but antegrade inspection disclosed no significant distal rectal abnormalities.  The scope was then removed from the patient. No biopsies were obtained.     COMPLICATIONS: None  ENDOSCOPIC IMPRESSION:  Normal  colonoscopy.  RECOMMENDATIONS:  Repeat colonoscopy in 2 years.    _______________________________ Rosalie DoctorBernette Redbird, MD 07/22/2013 3:05 PM

## 2013-07-22 NOTE — H&P (Signed)
Jacob House is an 47 y.o. male.   Chief Complaint: Lynch syndrome HPI: This patient presents for endoscopic and colonoscopic screening in view of the family history of colon cancer that is so strongly that needs to requirements for Lynch syndrome. Endoscopy and colonoscopy 2 years ago for unremarkable. He has no GI tract symptoms other than a symptomatic anal fissure, recently treated medically at [facility]  Past Medical History  Diagnosis Date  . Carpal tunnel syndrome   . GERD (gastroesophageal reflux disease)   . Complication of anesthesia 04-23-2013    pt told by anesthesia after probably has sleep apnea  . Complication of anesthesia     cannot lie on left side due to recent shoulder surgery per pt    Past Surgical History  Procedure Laterality Date  . Gallbladder surgery    . Anal fissure repair  july 2009 and yrs ago    x 2   . Cholecystectomy  1999  . Eye surgery Bilateral 2005    lasix  . Shoulder surgery Left sept 2014    cartlidge repair  . Fracture surgery Right     foot    Family History  Problem Relation Age of Onset  . Cancer Mother 94    colon, stomach  . Cancer Father     testicular  . Cancer Maternal Grandmother     ?colon  . Cancer Maternal Grandfather   . Cancer Maternal Uncle     colon  . Cancer Maternal Uncle     colon   Social History:  reports that he has never smoked. He has never used smokeless tobacco. He reports that he drinks alcohol. He reports that he does not use illicit drugs.  Allergies: No Known Allergies  Medications Prior to Admission  Medication Sig Dispense Refill  . Aspirin-Salicylamide-Caffeine (BC FAST PAIN RELIEF) 650-195-33.3 MG PACK Take 1 packet by mouth every 6 (six) hours as needed (pain).      Marland Kitchen HYDROcodone-acetaminophen (NORCO/VICODIN) 5-325 MG per tablet Take 1 tablet by mouth at bedtime as needed for moderate pain.      Marland Kitchen ibuprofen (ADVIL,MOTRIN) 200 MG tablet Take 400 mg by mouth every 6 (six) hours as needed for  mild pain.      Marland Kitchen oxyCODONE-acetaminophen (PERCOCET/ROXICET) 5-325 MG per tablet Take 1-2 tablets by mouth every 4 (four) hours as needed for moderate pain or severe pain.      . traMADol (ULTRAM) 50 MG tablet Take 50 mg by mouth at bedtime as needed for moderate pain.        No results found for this or any previous visit (from the past 48 hour(s)). No results found.  ROS: See history of present illness  Blood pressure 130/82, temperature 98.3 F (36.8 C), temperature source Oral, resp. rate 17, SpO2 99.00%. Physical Exam : A healthy-appearing gentleman, no evident distress. Anicteric and without pallor. Chest clear, heart without murmurs or arrhythmias. Abdomen somewhat adipose, no guarding, mass effect, or tenderness  Assessment/Plan Medically stable for endoscopy and colonoscopy to screen for, respectively, small bowel and colonic malignancies in a patient with Lynch syndrome. Proceed to endoscopy and colonoscopy under propofol sedation in view of prior history of significant agitation with high-dose standard sedation.  Keven Soucy V 07/22/2013, 12:56 PM

## 2013-07-22 NOTE — Anesthesia Preprocedure Evaluation (Addendum)
Anesthesia Evaluation  Patient identified by MRN, date of birth, ID band Patient awake    Reviewed: Allergy & Precautions, H&P , NPO status , Patient's Chart, lab work & pertinent test results  Airway Mallampati: III TM Distance: >3 FB Neck ROM: full    Dental no notable dental hx. (+) Teeth Intact and Dental Advisory Given   Pulmonary sleep apnea ,  Told by anesthesia in past he probably has OSA breath sounds clear to auscultation  Pulmonary exam normal       Cardiovascular Exercise Tolerance: Good negative cardio ROS  Rhythm:regular Rate:Normal     Neuro/Psych negative neurological ROS  negative psych ROS   GI/Hepatic negative GI ROS, Neg liver ROS, GERD-  Medicated and Controlled,  Endo/Other  negative endocrine ROSMorbid obesity  Renal/GU negative Renal ROS  negative genitourinary   Musculoskeletal   Abdominal (+) + obese,   Peds  Hematology negative hematology ROS (+)   Anesthesia Other Findings   Reproductive/Obstetrics negative OB ROS                          Anesthesia Physical Anesthesia Plan  ASA: III  Anesthesia Plan: MAC   Post-op Pain Management:    Induction:   Airway Management Planned: Simple Face Mask  Additional Equipment:   Intra-op Plan:   Post-operative Plan:   Informed Consent: I have reviewed the patients History and Physical, chart, labs and discussed the procedure including the risks, benefits and alternatives for the proposed anesthesia with the patient or authorized representative who has indicated his/her understanding and acceptance.   Dental Advisory Given  Plan Discussed with: CRNA and Surgeon  Anesthesia Plan Comments:        Anesthesia Quick Evaluation

## 2013-07-23 ENCOUNTER — Encounter (HOSPITAL_COMMUNITY): Payer: Self-pay | Admitting: Gastroenterology

## 2013-08-09 ENCOUNTER — Ambulatory Visit (INDEPENDENT_AMBULATORY_CARE_PROVIDER_SITE_OTHER): Payer: BC Managed Care – PPO | Admitting: Family Medicine

## 2013-08-09 VITALS — BP 120/70 | HR 111 | Temp 102.0°F | Resp 18 | Ht 66.0 in | Wt 231.0 lb

## 2013-08-09 DIAGNOSIS — R05 Cough: Secondary | ICD-10-CM

## 2013-08-09 DIAGNOSIS — R059 Cough, unspecified: Secondary | ICD-10-CM

## 2013-08-09 DIAGNOSIS — R509 Fever, unspecified: Secondary | ICD-10-CM

## 2013-08-09 LAB — POCT CBC
GRANULOCYTE PERCENT: 81.4 % — AB (ref 37–80)
HEMATOCRIT: 47.5 % (ref 43.5–53.7)
Hemoglobin: 14.8 g/dL (ref 14.1–18.1)
Lymph, poc: 1.1 (ref 0.6–3.4)
MCH: 28 pg (ref 27–31.2)
MCHC: 31.2 g/dL — AB (ref 31.8–35.4)
MCV: 89.9 fL (ref 80–97)
MID (CBC): 0.9 (ref 0–0.9)
MPV: 9.6 fL (ref 0–99.8)
PLATELET COUNT, POC: 259 10*3/uL (ref 142–424)
POC Granulocyte: 8.9 — AB (ref 2–6.9)
POC LYMPH %: 10.2 % (ref 10–50)
POC MID %: 8.4 %M (ref 0–12)
RBC: 5.28 M/uL (ref 4.69–6.13)
RDW, POC: 13.4 %
WBC: 10.9 10*3/uL — AB (ref 4.6–10.2)

## 2013-08-09 LAB — POCT INFLUENZA A/B
INFLUENZA A, POC: NEGATIVE
INFLUENZA B, POC: NEGATIVE

## 2013-08-09 MED ORDER — BENZONATATE 100 MG PO CAPS: 100.0000 mg | ORAL_CAPSULE | Freq: Three times a day (TID) | ORAL | Status: DC | PRN

## 2013-08-09 MED ORDER — OSELTAMIVIR PHOSPHATE 75 MG PO CAPS
75.0000 mg | ORAL_CAPSULE | Freq: Two times a day (BID) | ORAL | Status: DC
Start: 2013-08-09 — End: 2013-08-26

## 2013-08-09 MED ORDER — HYDROCODONE-HOMATROPINE 5-1.5 MG/5ML PO SYRP: 5.0000 mL | ORAL_SOLUTION | Freq: Three times a day (TID) | ORAL | Status: DC | PRN

## 2013-08-09 MED ORDER — IBUPROFEN 200 MG PO TABS: 400.0000 mg | ORAL_TABLET | Freq: Once | ORAL | Status: DC

## 2013-08-09 MED ORDER — CEFDINIR 300 MG PO CAPS: 300.0000 mg | ORAL_CAPSULE | Freq: Two times a day (BID) | ORAL | Status: DC

## 2013-08-09 NOTE — Progress Notes (Signed)
[facility] [address], [place] [address] [place]  Date:  08/09/2013   Name:  Jacob House   DOB:  1966-04-29   MRN:  [id number]  PCP:  Purvis Kilts, MD    Chief Complaint: Fever, Nasal Congestion and Cough   History of Present Illness:  Jacob House is a 48 y.o. very pleasant male patient who presents with the following:  He is here today with ilness- he had a temp up to 101.7 earlier this am.   He has cough, chills, fatigue, aches.   He has noted a mild ST.   No GI symptoms.     His GF has been ill as well- however she doe snot have a fever  He is generally healthy except for history of colon polyps.    He took some advil last night and hall's cough drops.      Patient Active Problem List   Diagnosis Date Noted  . Colon polyps 08/06/2012  . Carpal tunnel syndrome 06/13/2012    Past Medical History  Diagnosis Date  . Carpal tunnel syndrome   . GERD (gastroesophageal reflux disease)   . Complication of anesthesia 04-23-2013    pt told by anesthesia after probably has sleep apnea  . Complication of anesthesia     cannot lie on left side due to recent shoulder surgery per pt    Past Surgical History  Procedure Laterality Date  . Gallbladder surgery    . Anal fissure repair  july 2009 and yrs ago    x 2   . Cholecystectomy  1999  . Eye surgery Bilateral 2005    lasix  . Shoulder surgery Left sept 2014    cartlidge repair  . Fracture surgery Right     foot  . Esophagogastroduodenoscopy (egd) with propofol N/A 07/22/2013    Procedure: ESOPHAGOGASTRODUODENOSCOPY (EGD) WITH PROPOFOL;  Surgeon: Cleotis Nipper, MD;  Location: [facility];  Service: Endoscopy;  Laterality: N/A;  . Colonoscopy with propofol N/A 07/22/2013    Procedure: COLONOSCOPY WITH PROPOFOL;  Surgeon: Cleotis Nipper, MD;  Location: [facility];  Service: Endoscopy;  Laterality: N/A;    History  Substance Use Topics  . Smoking status: Never Smoker    . Smokeless tobacco: Never Used  . Alcohol Use: 0 - .5 oz/week    0-1 drink(s) per week     Comment: rare    Family History  Problem Relation Age of Onset  . Cancer Mother 37    colon, stomach  . Cancer Father     testicular  . Cancer Maternal Grandmother     ?colon  . Cancer Maternal Grandfather   . Cancer Maternal Uncle     colon  . Cancer Maternal Uncle     colon    No Known Allergies  Medication list has been reviewed and updated.  Current Outpatient Prescriptions on File Prior to Visit  Medication Sig Dispense Refill  . HYDROcodone-acetaminophen (NORCO/VICODIN) 5-325 MG per tablet Take 1 tablet by mouth at bedtime as needed for moderate pain.      Marland Kitchen ibuprofen (ADVIL,MOTRIN) 200 MG tablet Take 400 mg by mouth every 6 (six) hours as needed for mild pain.      Marland Kitchen oxyCODONE-acetaminophen (PERCOCET/ROXICET) 5-325 MG per tablet Take 1-2 tablets by mouth every 4 (four) hours as needed for moderate pain or severe pain.      . traMADol (ULTRAM) 50 MG tablet Take 50 mg by mouth at bedtime  as needed for moderate pain.      . Aspirin-Salicylamide-Caffeine (BC FAST PAIN RELIEF) 650-195-33.3 MG PACK Take 1 packet by mouth every 6 (six) hours as needed (pain).       No current facility-administered medications on file prior to visit.    Review of Systems:  As per HPI- otherwise negative.   Physical Examination: Filed Vitals:   08/09/13 1339  BP: 120/70  Pulse: 111  Temp: 100.3 F (37.9 C)  Resp: 18   Filed Vitals:   08/09/13 1339  Height: 5\' 6"  (1.676 m)  Weight: 231 lb (104.781 kg)   Body mass index is 37.3 kg/(m^2). Ideal Body Weight: Weight in (lb) to have BMI = 25: 154.6  GEN: WDWN, NAD, Non-toxic, A & O x 3, obese, appears well HEENT: Atraumatic, Normocephalic. Neck supple. No masses, No LAD.  Bilateral TM wnl, oropharynx normal.  PEERL,EOMI.   Ears and Nose: No external deformity. CV: RRR, No M/G/R. No JVD. No thrill. No extra heart sounds. PULM: CTA B, no  wheezes, crackles, rhonchi. No retractions. No resp. distress. No accessory muscle use. ABD: S, NT, ND, EXTR: No c/c/e NEURO Normal gait.  PSYCH: Normally interactive. Conversant. Not depressed or anxious appearing.  Calm demeanor.   Results for orders placed in visit on 08/09/13  POCT INFLUENZA A/B      Result Value Range   Influenza A, POC Negative     Influenza B, POC Negative    POCT CBC      Result Value Range   WBC 10.9 (*) 4.6 - 10.2 K/uL   Lymph, poc 1.1  0.6 - 3.4   POC LYMPH PERCENT 10.2  10 - 50 %L   MID (cbc) 0.9  0 - 0.9   POC MID % 8.4  0 - 12 %M   POC Granulocyte 8.9 (*) 2 - 6.9   Granulocyte percent 81.4 (*) 37 - 80 %G   RBC 5.28  4.69 - 6.13 M/uL   Hemoglobin 14.8  14.1 - 18.1 g/dL   HCT, POC 47.5  43.5 - 53.7 %   MCV 89.9  80 - 97 fL   MCH, POC 28.0  27 - 31.2 pg   MCHC 31.2 (*) 31.8 - 35.4 g/dL   RDW, POC 13.4     Platelet Count, POC 259  142 - 424 K/uL   MPV 9.6  0 - 99.8 fL    Treated with 400 mg of ibuprofen while in clinic  Assessment and Plan: Fever, unspecified - Plan: POCT Influenza A/B, ibuprofen (ADVIL,MOTRIN) tablet 400 mg, POCT CBC, oseltamivir (TAMIFLU) 75 MG capsule  Cough - Plan: cefdinir (OMNICEF) 300 MG capsule, benzonatate (TESSALON) 100 MG capsule, HYDROcodone-homatropine (HYCODAN) 5-1.5 MG/5ML syrup  Suspect that Lelon does have the flu.  However his test is negative and his WBC count is up with a mild left shift.   He declines a CXR as his deductible is high.   He is only occasionally using his pain medications now from a shoulder injury- cautioned him to NOT use this along with his cough syrup.    Signed Lamar Blinks, MD

## 2013-08-09 NOTE — Patient Instructions (Signed)
I suspect that you may have the flu, although your test is negative.  Because your white blood cell count is up a bit we are also going to treat you with an antibiotic.    Let me know if you are not better in the next few days . Keep your fever down with ibuprofen as needed.  If you are getting worse please call or come back in

## 2013-08-12 ENCOUNTER — Institutional Professional Consult (permissible substitution): Payer: BC Managed Care – PPO | Admitting: Neurology

## 2013-08-13 ENCOUNTER — Telehealth: Payer: Self-pay

## 2013-08-13 DIAGNOSIS — J3489 Other specified disorders of nose and nasal sinuses: Secondary | ICD-10-CM

## 2013-08-13 MED ORDER — FLUTICASONE PROPIONATE 50 MCG/ACT NA SUSP
2.0000 | Freq: Every day | NASAL | Status: DC
Start: 2013-08-13 — End: 2015-11-28

## 2013-08-13 NOTE — Telephone Encounter (Signed)
Called him back.  He is still coughing up some material.  His fever is gone.  Overall he does feel better, no longer has aches and chills.  Explained that if he does have a bacterial illness omnicef is a great agent to use. At this point would not recommend changing from one broad spectrum abx to another (levaquin) due to risk of GI problems.  He understands. Explained that he may cough for several weeks and this is normal.   He would like some flonase- called it in.  He will let me know if getting worse or if fevers return

## 2013-08-13 NOTE — Telephone Encounter (Signed)
Dr.Copland, Pt states that he would like levaquin for his cough. Best# [phone]

## 2013-08-16 ENCOUNTER — Telehealth: Payer: Self-pay

## 2013-08-16 NOTE — Telephone Encounter (Signed)
Pt requesting meds for persistent cough,keeping him up at night.   Best phone for pt is [phone]   [facility] [address]

## 2013-08-16 NOTE — Telephone Encounter (Signed)
Pt seen on the 5th. He is continuing to cough. Can we send in a cough syrup for him?

## 2013-08-16 NOTE — Telephone Encounter (Signed)
Called him back- he continues to cough but otherwise is doing ok. The cough especially keeps him up at night.  Explained that hycodan is now schedule 2 so he would have to come by to pick up an rx.  However he still has some percocet at home from December.  Suggested that he take 1/2 a pill before bed as a cough suppressant.  If he is feeling worse or is worried we are glad to see him for a recheck at his convenience.

## 2013-08-26 ENCOUNTER — Ambulatory Visit: Payer: BC Managed Care – PPO

## 2013-08-26 ENCOUNTER — Ambulatory Visit (INDEPENDENT_AMBULATORY_CARE_PROVIDER_SITE_OTHER): Payer: BC Managed Care – PPO | Admitting: Family Medicine

## 2013-08-26 VITALS — BP 130/70 | HR 98 | Temp 98.3°F | Resp 18 | Ht 66.4 in | Wt 239.6 lb

## 2013-08-26 DIAGNOSIS — R05 Cough: Secondary | ICD-10-CM

## 2013-08-26 DIAGNOSIS — R6883 Chills (without fever): Secondary | ICD-10-CM

## 2013-08-26 DIAGNOSIS — R059 Cough, unspecified: Secondary | ICD-10-CM

## 2013-08-26 LAB — POCT CBC
GRANULOCYTE PERCENT: 57.4 % (ref 37–80)
HCT, POC: 44.5 % (ref 43.5–53.7)
HEMOGLOBIN: 13.7 g/dL — AB (ref 14.1–18.1)
LYMPH, POC: 1.9 (ref 0.6–3.4)
MCH, POC: 27.2 pg (ref 27–31.2)
MCHC: 30.8 g/dL — AB (ref 31.8–35.4)
MCV: 88.5 fL (ref 80–97)
MID (cbc): 0.6 (ref 0–0.9)
MPV: 9.3 fL (ref 0–99.8)
POC GRANULOCYTE: 3.4 (ref 2–6.9)
POC LYMPH PERCENT: 31.8 %L (ref 10–50)
POC MID %: 10.8 % (ref 0–12)
Platelet Count, POC: 298 10*3/uL (ref 142–424)
RBC: 5.03 M/uL (ref 4.69–6.13)
RDW, POC: 13.6 %
WBC: 6 10*3/uL (ref 4.6–10.2)

## 2013-08-26 MED ORDER — AZITHROMYCIN 250 MG PO TABS: ORAL_TABLET | ORAL | Status: DC

## 2013-08-26 NOTE — Progress Notes (Addendum)
Subjective:   This chart was scribed for Merri Ray, MD, by Neta Ehlers. The patient's care was started at 4:35 PM.   Patient ID: Jacob House, male    DOB: 29-Aug-1965, 48 y.o.   MRN: [id number]  Cough Associated symptoms include postnasal drip and shortness of breath (One episode yesterday). Pertinent negatives include no chest pain or fever.    HPI Comments:  Jacob House is a 48 y.o. male who presents to [facility] complaining of a persistent cough. He reports the cough has been productive of clear phlegm with occasional episodes of brown phlegm. Jacob House reports the cough is worse during the day, though the cough occasionally interrupts his sleep at night. The pt reports he had a brief episode of SOB yesterday; he denies any previous episodes of SOB.  He also reports diaphoresis in the evening affecting his scalp and neck; he has not assessed his temperature during the diaphoretic episodes.  In the office, the pt's temperature is 98.3 F.    The pt has addressed his cough with multiple medications. He has used Flonase twice a day for 10 days. He uses benzonatate during the day and hycodan at night; his last dose of hycodan was yesterday evening. Jacob House has also used Delsym; his last dose was several days ago. He uses percocets occasionally, though he has not taken any in several days. The pt has finished the course of prescribed Omnicef.  Jacob House had an esophagogastroduodenoscopy in December 2014 and surgery was not recommended. The pt had polyps at the base of his esophagus approximately twenty years ago. He denies current sensations of heart-burn; he denies taking medication for heart burn. He denies a h/o asthma.  He also denies CHF, other cardiac issues, or lower extremity swelling.   Jacob House was seen on August 09, 2013 with a fever and cough. Suspected influenza, but borderline WBC of 10.9. The pt was treated with omnicef, tessalon, and hycodan cough syrup. Chest x-ray  declined at that visit. Phone call on 1-9 was noted; Flonase was prescribed. The pt was advised to take percocet at bedtime as cough suppressant on a August 16, 2013 phone call.   Dr. Sharilyn Sites is the pt's PCP.   Past Medical History  Diagnosis Date  . Carpal tunnel syndrome   . GERD (gastroesophageal reflux disease)   . Complication of anesthesia 04-23-2013    pt told by anesthesia after probably has sleep apnea  . Complication of anesthesia     cannot lie on left side due to recent shoulder surgery per pt    Past Surgical History  Procedure Laterality Date  . Gallbladder surgery    . Anal fissure repair  july 2009 and yrs ago    x 2   . Cholecystectomy  1999  . Eye surgery Bilateral 2005    lasix  . Shoulder surgery Left sept 2014    cartlidge repair  . Fracture surgery Right     foot  . Esophagogastroduodenoscopy (egd) with propofol N/A 07/22/2013    Procedure: ESOPHAGOGASTRODUODENOSCOPY (EGD) WITH PROPOFOL;  Surgeon: Cleotis Nipper, MD;  Location: [facility];  Service: Endoscopy;  Laterality: N/A;  . Colonoscopy with propofol N/A 07/22/2013    Procedure: COLONOSCOPY WITH PROPOFOL;  Surgeon: Cleotis Nipper, MD;  Location: [facility];  Service: Endoscopy;  Laterality: N/A;    Current Outpatient Prescriptions on File Prior to Visit  Medication Sig Dispense Refill  . Aspirin-Salicylamide-Caffeine (BC FAST PAIN RELIEF) 650-195-33.3 MG PACK Take  1 packet by mouth every 6 (six) hours as needed (pain).      . benzonatate (TESSALON) 100 MG capsule Take 1 capsule (100 mg total) by mouth 3 (three) times daily as needed for cough.  40 capsule  0  . HYDROcodone-homatropine (HYCODAN) 5-1.5 MG/5ML syrup Take 5 mLs by mouth every 8 (eight) hours as needed for cough.  90 mL  0  . oxyCODONE-acetaminophen (PERCOCET/ROXICET) 5-325 MG per tablet Take 1-2 tablets by mouth every 4 (four) hours as needed for moderate pain or severe pain.      . traMADol (ULTRAM) 50 MG tablet Take 50 mg  by mouth at bedtime as needed for moderate pain.      . fluticasone (FLONASE) 50 MCG/ACT nasal spray Place 2 sprays into both nostrils daily.  16 g  6  . HYDROcodone-acetaminophen (NORCO/VICODIN) 5-325 MG per tablet Take 1 tablet by mouth at bedtime as needed for moderate pain.       Current Facility-Administered Medications on File Prior to Visit  Medication Dose Route Frequency Provider Last Rate Last Dose  . ibuprofen (ADVIL,MOTRIN) tablet 400 mg  400 mg Oral Once Pearline Cables, MD        No Known Allergies  History   Social History  . Marital Status: Significant Other    Spouse Name: Olegario Messier    Number of Children: 0  . Years of Education: 16   Occupational History  . machine operator    Social History Main Topics  . Smoking status: Never Smoker   . Smokeless tobacco: Never Used  . Alcohol Use: 0 - .5 oz/week    0-1 drink(s) per week     Comment: rare  . Drug Use: No  . Sexual Activity: Yes    Partners: Female    Birth Control/ Protection: Condom     Comment: 1 sexual partner in last year   Other Topics Concern  . Not on file   Social History Narrative   Lives with his significant other, Olegario Messier, and their cats.    Review of Systems  Constitutional: Positive for diaphoresis. Negative for fever.  HENT: Positive for postnasal drip.   Respiratory: Positive for cough and shortness of breath (One episode yesterday).   Cardiovascular: Negative for chest pain and leg swelling.    Vitals: 130/70  Pulse 98  Temp(Src) 98.3 F (36.8 C) (Oral)  Resp 18  Ht 5' 6.4" (1.687 m)  Wt 239 lb 9.6 oz (108.682 kg)  BMI 38.19 kg/m2  SpO2 97%     Objective:   Physical Exam  Nursing note and vitals reviewed. Constitutional: He is oriented to person, place, and time. He appears well-developed and well-nourished. No distress.  HENT:  Head: Normocephalic and atraumatic.  Eyes: EOM are normal. Pupils are equal, round, and reactive to light.  Neck: Neck supple. No JVD present.  Carotid bruit is not present. No tracheal deviation present.  No stridor.  No lymphadenopathy.   Cardiovascular: Normal rate, regular rhythm and normal heart sounds.  Exam reveals no gallop.   No murmur heard. Heart rate about 80.   Pulmonary/Chest: Effort normal and breath sounds normal. No respiratory distress. He has no wheezes. He has no rales.  Air moving well.   Musculoskeletal: Normal range of motion. He exhibits no edema.  Neurological: He is alert and oriented to person, place, and time.  Skin: Skin is warm and dry.  Psychiatric: He has a normal mood and affect. His behavior is normal.  Filed Vitals:   08/26/13 1622  BP: 130/70  Pulse: 98  Temp: 98.3 F (36.8 C)  TempSrc: Oral  Resp: 18  Height: 5' 6.4" (1.687 m)  Weight: 239 lb 9.6 oz (108.682 kg)  SpO2: 97%   [facility] reading (PRIMARY) by  Dr. Carlota Raspberry: CXR: incomplete inspiration, but no acute infiltrate.  Results for orders placed in visit on 08/26/13  POCT CBC      Result Value Range   WBC 6.0  4.6 - 10.2 K/uL   Lymph, poc 1.9  0.6 - 3.4   POC LYMPH PERCENT 31.8  10 - 50 %L   MID (cbc) 0.6  0 - 0.9   POC MID % 10.8  0 - 12 %M   POC Granulocyte 3.4  2 - 6.9   Granulocyte percent 57.4  37 - 80 %G   RBC 5.03  4.69 - 6.13 M/uL   Hemoglobin 13.7 (*) 14.1 - 18.1 g/dL   HCT, POC 44.5  43.5 - 53.7 %   MCV 88.5  80 - 97 fL   MCH, POC 27.2  27 - 31.2 pg   MCHC 30.8 (*) 31.8 - 35.4 g/dL   RDW, POC 13.6     Platelet Count, POC 298  142 - 424 K/uL   MPV 9.3  0 - 99.8 fL        Assessment & Plan:  Jacob House is a 48 y.o. male Cough - Plan: POCT CBC, DG Chest 2 View  Chills  Persistent cough, with coughing fits at times. Decreased inspiration on CXR, but reassuring CBC.  Postinfectious cough vs LPR vs atypicals with bronchitis. Start zantac 75mg  BID, cont tessalon perles, flonase, and if not improving into next week - can fill Zpak. If still not improving in next 2 weeks, consider repeat imaging or other possible  cough triggers.   Meds ordered this encounter  Medications  . azithromycin (ZITHROMAX) 250 MG tablet    Sig: Take 2 pills by mouth on day 1, then 1 pill by mouth per day on days 2 through 5.    Dispense:  6 each    Refill:  0   Patient Instructions  Try to take zantac over the counter - 75mg  - twice per day for possible heartburn contributing to your cough. Ok to continue tessalon perles for cough 3 times per day as needed, continue flonase, and if not improved into next week - can start Zpak.. If still not improving in 2 weeks, or worse sooner - return for recheck.   Return to the clinic or go to the nearest emergency room if any of your symptoms worsen or new symptoms occur.  Cough, Adult  A cough is a reflex that helps clear your throat and airways. It can help heal the body or may be a reaction to an irritated airway. A cough may only last 2 or 3 weeks (acute) or may last more than 8 weeks (chronic).  CAUSES Acute cough:  Viral or bacterial infections. Chronic cough:  Infections.  Allergies.  Asthma.  Post-nasal drip.  Smoking.  Heartburn or acid reflux.  Some medicines.  Chronic lung problems (COPD).  Cancer. SYMPTOMS   Cough.  Fever.  Chest pain.  Increased breathing rate.  High-pitched whistling sound when breathing (wheezing).  Colored mucus that you cough up (sputum). TREATMENT   A bacterial cough may be treated with antibiotic medicine.  A viral cough must run its course and will not respond to antibiotics.  Your caregiver  may recommend other treatments if you have a chronic cough. HOME CARE INSTRUCTIONS   Only take over-the-counter or prescription medicines for pain, discomfort, or fever as directed by your caregiver. Use cough suppressants only as directed by your caregiver.  Use a cold steam vaporizer or humidifier in your bedroom or home to help loosen secretions.  Sleep in a semi-upright position if your cough is worse at night.  Rest  as needed.  Stop smoking if you smoke. SEEK IMMEDIATE MEDICAL CARE IF:   You have pus in your sputum.  Your cough starts to worsen.  You cannot control your cough with suppressants and are losing sleep.  You begin coughing up blood.  You have difficulty breathing.  You develop pain which is getting worse or is uncontrolled with medicine.  You have a fever. MAKE SURE YOU:   Understand these instructions.  Will watch your condition.  Will get help right away if you are not doing well or get worse. Document Released: 01/18/2011 Document Revised: 10/14/2011 Document Reviewed: 01/18/2011 [facility] Patient Information 2014 [place].       I personally performed the services described in this documentation, which was scribed in my presence. The recorded information has been reviewed and considered, and addended by me as needed.

## 2013-08-26 NOTE — Patient Instructions (Signed)
Try to take zantac over the counter - 75mg  - twice per day for possible heartburn contributing to your cough. Ok to continue tessalon perles for cough 3 times per day as needed, continue flonase, and if not improved into next week - can start Zpak.. If still not improving in 2 weeks, or worse sooner - return for recheck.   Return to the clinic or go to the nearest emergency room if any of your symptoms worsen or new symptoms occur.  Cough, Adult  A cough is a reflex that helps clear your throat and airways. It can help heal the body or may be a reaction to an irritated airway. A cough may only last 2 or 3 weeks (acute) or may last more than 8 weeks (chronic).  CAUSES Acute cough:  Viral or bacterial infections. Chronic cough:  Infections.  Allergies.  Asthma.  Post-nasal drip.  Smoking.  Heartburn or acid reflux.  Some medicines.  Chronic lung problems (COPD).  Cancer. SYMPTOMS   Cough.  Fever.  Chest pain.  Increased breathing rate.  High-pitched whistling sound when breathing (wheezing).  Colored mucus that you cough up (sputum). TREATMENT   A bacterial cough may be treated with antibiotic medicine.  A viral cough must run its course and will not respond to antibiotics.  Your caregiver may recommend other treatments if you have a chronic cough. HOME CARE INSTRUCTIONS   Only take over-the-counter or prescription medicines for pain, discomfort, or fever as directed by your caregiver. Use cough suppressants only as directed by your caregiver.  Use a cold steam vaporizer or humidifier in your bedroom or home to help loosen secretions.  Sleep in a semi-upright position if your cough is worse at night.  Rest as needed.  Stop smoking if you smoke. SEEK IMMEDIATE MEDICAL CARE IF:   You have pus in your sputum.  Your cough starts to worsen.  You cannot control your cough with suppressants and are losing sleep.  You begin coughing up blood.  You have  difficulty breathing.  You develop pain which is getting worse or is uncontrolled with medicine.  You have a fever. MAKE SURE YOU:   Understand these instructions.  Will watch your condition.  Will get help right away if you are not doing well or get worse. Document Released: 01/18/2011 Document Revised: 10/14/2011 Document Reviewed: 01/18/2011 [facility] Patient Information 2014 [place].

## 2013-08-27 ENCOUNTER — Telehealth: Payer: Self-pay

## 2013-08-27 ENCOUNTER — Other Ambulatory Visit: Payer: Self-pay | Admitting: Family Medicine

## 2013-08-27 NOTE — Telephone Encounter (Signed)
Patient states he needs his medication changed. Says z-pack doesn't work for him and requests different antibiotic. Can he get Levoquin? Cb# [phone] and uses [facility] on [address].

## 2013-08-28 MED ORDER — HYDROCOD POLST-CHLORPHEN POLST 10-8 MG/5ML PO LQCR: 5.0000 mL | Freq: Two times a day (BID) | ORAL | Status: DC | PRN

## 2013-08-28 NOTE — Telephone Encounter (Signed)
Spoke with patient - he continues to cough but admits it does not seem to be productive.  Denies fever or chills.  Cough feels like it is in his throat - describes as "tickle cough." States Hycodan does help some.  Is requesting Levaquin because "Zpack doesn't work for him."  Discussed that cough is likely post-viral inflammatory and antibiotics will not help. He is willing to try Tussionex q12hours over the weekend and discuss possible treatment with antibiotics if he doesn't seem to be improving.

## 2013-08-31 ENCOUNTER — Encounter: Payer: Self-pay | Admitting: Neurology

## 2013-08-31 ENCOUNTER — Institutional Professional Consult (permissible substitution): Payer: BC Managed Care – PPO | Admitting: Neurology

## 2013-08-31 ENCOUNTER — Encounter (INDEPENDENT_AMBULATORY_CARE_PROVIDER_SITE_OTHER): Payer: Self-pay

## 2013-08-31 ENCOUNTER — Ambulatory Visit (INDEPENDENT_AMBULATORY_CARE_PROVIDER_SITE_OTHER): Payer: BC Managed Care – PPO | Admitting: Neurology

## 2013-08-31 VITALS — BP 127/75 | HR 85 | Temp 98.5°F | Ht 67.0 in | Wt 237.0 lb

## 2013-08-31 DIAGNOSIS — G4733 Obstructive sleep apnea (adult) (pediatric): Secondary | ICD-10-CM

## 2013-08-31 NOTE — Patient Instructions (Signed)

## 2013-08-31 NOTE — Progress Notes (Signed)
Subjective:    Patient ID: Jacob House is a 48 y.o. male.  HPI    Star Age, MD, PhD [facility] [address], [address] [place], [place] [address]  Dear Dr. Hilma Favors,  I saw your patient, Jacob House, upon your kind request in my neurologic clinic today for initial consultation of his sleep disorder, in particular, concern for obstructive sleep apnea. The patient is unaccompanied today. As you know, Jacob House is a 48 year old right-handed gentleman with an underlying medical history of carpal tunnel syndrome, shoulder pain, and recent flu, who reports snoring and excessive daytime somnolence. He had shoulder surgery on the L in 9/14 and was told by anesthesia, that he had apneas. He has not worked since 8/14. He has PT 3 days/week.   His typical bedtime is reported to be around 12:30 to 1 AM and usual wake time is around 9:30 AM. Sleep onset typically occurs within 15-20 minutes. He reports feeling tired rested upon awakening. He wakes up on an average 2 times in the middle of the night and has to go to the bathroom 2 times on a typical night. He denies morning headaches.  He reports excessive daytime somnolence (EDS) and His Epworth Sleepiness Score (ESS) is 11/24 today. He has not fallen asleep while driving. The patient has not been taking a planned nap.  He has been known to snore for the past many years. Snoring is reportedly marked, and associated with choking sounds and witnessed apneas. The patient denies a sense of choking or strangling feeling. He has been using an over-the-counter oral appliance which has been helping his snoring some. There is report of nighttime reflux, with no nighttime cough experienced. The patient has not noted any RLS symptoms and is not known to kick while asleep or before falling asleep. There is no family history of RLS or OSA.  He is not a very restless sleeper.   He denies cataplexy, sleep paralysis, hypnagogic or  hypnopompic hallucinations, or sleep attacks. He does not report any vivid dreams, nightmares, dream enactments, or parasomnias, such as sleep talking or sleep walking. The patient has not had a sleep study or a home sleep test.   He consumes 4 to 5 caffeinated beverages per day, usually in the form of coffee, sodas, and tea.  His bedroom is usually dark and cool. There is no TV in the bedroom and sometimes a cat.   His Past Medical History Is Significant For: Past Medical History  Diagnosis Date  . Carpal tunnel syndrome   . GERD (gastroesophageal reflux disease)   . Complication of anesthesia 04-23-2013    pt told by anesthesia after probably has sleep apnea  . Complication of anesthesia     cannot lie on left side due to recent shoulder surgery per pt  . Snoring     His Past Surgical History Is Significant For: Past Surgical History  Procedure Laterality Date  . Gallbladder surgery    . Anal fissure repair  july 2009 and yrs ago    x 2   . Cholecystectomy  1999  . Eye surgery Bilateral 2005    lasix  . Shoulder surgery Left sept 2014    cartlidge repair  . Fracture surgery Right     foot  . Esophagogastroduodenoscopy (egd) with propofol N/A 07/22/2013    Procedure: ESOPHAGOGASTRODUODENOSCOPY (EGD) WITH PROPOFOL;  Surgeon: Cleotis Nipper, MD;  Location: [facility];  Service: Endoscopy;  Laterality: N/A;  . Colonoscopy  with propofol N/A 07/22/2013    Procedure: COLONOSCOPY WITH PROPOFOL;  Surgeon: Cleotis Nipper, MD;  Location: [facility];  Service: Endoscopy;  Laterality: N/A;    His Family History Is Significant For: Family History  Problem Relation Age of Onset  . Cancer Mother 87    colon, stomach  . Cancer Father     testicular  . Cancer Maternal Grandmother     ?colon  . Cancer Maternal Grandfather   . Cancer Maternal Uncle     colon  . Cancer Maternal Uncle     colon    His Social History Is Significant For: History   Social History  . Marital  Status: Significant Other    Spouse Name: Juliann Pulse    Number of Children: 0  . Years of Education: 16   Occupational History  . machine operator    Social History Main Topics  . Smoking status: Never Smoker   . Smokeless tobacco: Never Used  . Alcohol Use: 0 - .5 oz/week    0-1 drink(s) per week     Comment: rare  . Drug Use: No  . Sexual Activity: Yes    Partners: Female    Birth Control/ Protection: Condom     Comment: 1 sexual partner in last year   Other Topics Concern  . None   Social History Narrative   Lives with his significant other, Juliann Pulse, and their cats.    His Allergies Are:  No Known Allergies:   His Current Medications Are:  Outpatient Encounter Prescriptions as of 08/31/2013  Medication Sig  . Aspirin-Salicylamide-Caffeine (BC FAST PAIN RELIEF) 650-195-33.3 MG PACK Take 1 packet by mouth every 6 (six) hours as needed (pain).  . benzonatate (TESSALON) 100 MG capsule TAKE 1 CAPSULE THREE TIMES DAILY AS NEEDED FOR COUGH.  . chlorpheniramine-HYDROcodone (TUSSIONEX PENNKINETIC ER) 10-8 MG/5ML LQCR Take 5 mLs by mouth every 12 (twelve) hours as needed for cough (cough).  . fluticasone (FLONASE) 50 MCG/ACT nasal spray Place 2 sprays into both nostrils daily.  Marland Kitchen HYDROcodone-acetaminophen (NORCO/VICODIN) 5-325 MG per tablet Take 1 tablet by mouth at bedtime as needed for moderate pain.  Marland Kitchen HYDROcodone-homatropine (HYCODAN) 5-1.5 MG/5ML syrup Take 5 mLs by mouth every 8 (eight) hours as needed for cough.  Marland Kitchen oxyCODONE-acetaminophen (PERCOCET/ROXICET) 5-325 MG per tablet Take 1-2 tablets by mouth every 4 (four) hours as needed for moderate pain or severe pain.  . traMADol (ULTRAM) 50 MG tablet Take 50 mg by mouth at bedtime as needed for moderate pain.  . [DISCONTINUED] azithromycin (ZITHROMAX) 250 MG tablet Take 2 pills by mouth on day 1, then 1 pill by mouth per day on days 2 through 5.  :  Review of Systems:  Out of a complete 14 point review of systems, all are reviewed  and negative with the exception of these symptoms as listed below:   Review of Systems  Constitutional: Negative.   HENT: Negative.   Eyes: Negative.   Respiratory: Positive for cough.        Snoring  Cardiovascular: Negative.   Gastrointestinal: Negative.   Endocrine: Negative.   Genitourinary: Negative.   Musculoskeletal: Negative.   Skin: Negative.   Allergic/Immunologic: Negative.   Neurological: Negative.   Hematological: Negative.   Psychiatric/Behavioral: Positive for sleep disturbance (snoring).    Objective:  Neurologic Exam  Physical Exam Physical Examination:   Filed Vitals:   08/31/13 0941  BP: 127/75  Pulse: 85  Temp: 98.5 F (36.9 C)    General  Examination: The patient is a very pleasant 48 y.o. male in no acute distress. He appears well-developed and well-nourished and well groomed.   HEENT: Normocephalic, atraumatic, pupils are equal, round and reactive to light and accommodation. Funduscopic exam is normal with sharp disc margins noted. Extraocular tracking is good without limitation to gaze excursion or nystagmus noted. Normal smooth pursuit is noted. Hearing is grossly intact. Tympanic membranes are clear bilaterally. Face is symmetric with normal facial animation and normal facial sensation. Speech is clear with no dysarthria noted. There is no hypophonia. There is no lip, neck/head, jaw or voice tremor. Neck is supple with full range of passive and active motion. There are no carotid bruits on auscultation. Oropharynx exam reveals: severe mouth dryness, adequate dental hygiene and moderate airway crowding, due to narrow airway entry and tonsillar size. Mallampati is class II. Tongue protrudes centrally and palate elevates symmetrically. Tonsils are 2+ in size. Neck size is 17 inches.   Chest: Clear to auscultation without wheezing, rhonchi or crackles noted.  Heart: S1+S2+0, regular and normal without murmurs, rubs or gallops noted.   Abdomen: Soft,  non-tender and non-distended with normal bowel sounds appreciated on auscultation.  Extremities: There is no pitting edema in the distal lower extremities bilaterally. Pedal pulses are intact.  Skin: Warm and dry without trophic changes noted. There are no varicose veins.  Musculoskeletal: exam reveals no obvious joint deformities, tenderness or joint swelling or erythema, except decrease in ROM in his L shoulder with pain reported on arm abduction.  Neurologically:  Mental status: The patient is awake, alert and oriented in all 4 spheres. His memory, attention, language and knowledge are appropriate. There is no aphasia, agnosia, apraxia or anomia. Speech is clear with normal prosody and enunciation. Thought process is linear. Mood is congruent and affect is normal.  Cranial nerves are as described above under HEENT exam. In addition, shoulder shrug is normal with equal shoulder height noted. Motor exam: Normal bulk, strength and tone is noted. There is no drift, tremor or rebound. Romberg is negative. Reflexes are 2+ throughout. Toes are downgoing bilaterally. Fine motor skills are intact with normal finger taps, normal hand movements, normal rapid alternating patting, normal foot taps and normal foot agility.  Cerebellar testing shows no dysmetria or intention tremor on finger to nose testing. Heel to shin is unremarkable bilaterally. There is no truncal or gait ataxia.  Sensory exam is intact to light touch, pinprick, vibration, temperature sense and proprioception in the upper and lower extremities.  Gait, station and balance are unremarkable. No veering to one side is noted. No leaning to one side is noted. Posture is age-appropriate and stance is narrow based. No problems turning are noted. He turns en bloc. Tandem walk is unremarkable. Intact toe and heel stance is noted.               Assessment and Plan:   In summary, Jacob House is a very pleasant 48 y.o.-year old male with a history  and physical exam concerning for obstructive sleep apnea (OSA). I had a long chat with the patient about my findings and the diagnosis of OSA, its prognosis and treatment options. We talked about medical treatments and non-pharmacological approaches. I explained in particular the risks and ramifications of untreated moderate to severe OSA, especially with respect to developing cardiovascular disease down the Road, including congestive heart failure, difficult to treat hypertension, cardiac arrhythmias, or stroke. Even type 2 diabetes has in part been linked to untreated OSA.  We talked about trying to maintain a healthy lifestyle in general, as well as the importance of weight control. I encouraged the patient to eat healthy, exercise daily and keep well hydrated, to keep a scheduled bedtime and wake time routine, to not skip any meals and eat healthy snacks in between meals.  I recommended the following at this time: sleep study with potential positive airway pressure titration.  I explained the sleep test procedure to the patient and also outlined possible surgical and non-surgical treatment options of OSA, including the use of a custom-made dental device, upper airway surgical options, such as pillar implants, radiofrequency surgery, tongue base surgery, and UPPP. I also explained the CPAP treatment option to the patient, who indicated that he would be willing to try CPAP if the need arises. I explained the importance of being compliant with PAP treatment, not only for insurance purposes but primarily to improve His symptoms, and for the patient's long term health benefit, including to reduce His cardiovascular risks. I answered all his questions today and the patient was in agreement. I would like to see him back after the sleep study is completed and encouraged him to call with any interim questions, concerns, problems or updates.   Thank you very much for allowing me to participate in the care of this  nice patient. If I can be of any further assistance to you please do not hesitate to call me at [phone].  Sincerely,   Star Age, MD, PhD

## 2013-10-06 ENCOUNTER — Ambulatory Visit (INDEPENDENT_AMBULATORY_CARE_PROVIDER_SITE_OTHER): Payer: BC Managed Care – PPO

## 2013-10-06 DIAGNOSIS — G4730 Sleep apnea, unspecified: Secondary | ICD-10-CM

## 2013-10-06 DIAGNOSIS — G4733 Obstructive sleep apnea (adult) (pediatric): Secondary | ICD-10-CM

## 2013-10-06 DIAGNOSIS — G479 Sleep disorder, unspecified: Secondary | ICD-10-CM

## 2013-10-06 DIAGNOSIS — G4710 Hypersomnia, unspecified: Secondary | ICD-10-CM

## 2013-10-15 ENCOUNTER — Telehealth: Payer: Self-pay | Admitting: Neurology

## 2013-10-15 DIAGNOSIS — G4733 Obstructive sleep apnea (adult) (pediatric): Secondary | ICD-10-CM

## 2013-10-15 NOTE — Telephone Encounter (Signed)
Please call and notify patient that the recent sleep study confirmed the diagnosis of OSA. He did very well with CPAP during the study with significant improvement of the respiratory events. Therefore, I would like start the patient on CPAP at home. I placed the order in the chart.   Arrange for CPAP set up at home through a DME company of patient's choice and fax/route report to PCP and referring MD (if other than PCP).   The patient will also need a follow up appointment with me in 6-8 weeks post set up that has to be scheduled; help the patient schedule this (in a follow-up slot).   Please re-enforce the importance of compliance with treatment and the need for us to monitor compliance data.   Once you have spoken to the patient and scheduled the return appointment, you may close this encounter, thanks,   Teruko Joswick, MD, PhD [facility] ([facility]    

## 2013-10-18 ENCOUNTER — Encounter: Payer: Self-pay | Admitting: *Deleted

## 2013-10-18 NOTE — Telephone Encounter (Signed)
Patient called for his recent sleep study results. I informed the patient that the study confirmed the diagnosis of obstructive sleep apnea. He did well on CPAP during the night of his study with significant improvement of respiratory events. Dr. [name] recommend CPAP therapy at home and I will send his order to [place] who will contact his insurance carrier. I will fax a copy of the report to Dr. Loraine Leriche office and mail the report to the patient along with a follow up instruction letter.

## 2013-11-17 ENCOUNTER — Encounter: Payer: Self-pay | Admitting: Neurology

## 2013-11-26 NOTE — Progress Notes (Signed)
Quick Note:  I reviewed the patient's CPAP compliance data from 10/26/2013 to 11/07/2013, which is a total of 13 days, during which time the patient used CPAP every day. The average usage for all days was 7 hours and 17 minutes. The percent used days greater than 4 hours was 100 %, indicating superb compliance. The residual AHI was 8 per hour, indicating a suboptimal treatment pressure of 15 cwp. A longer treatment period will help [place] determine if this treatment pressure needs to be changed or his mask refitted. I will review this data with the patient at the next office visit, which is currently routinely scheduled for 12/08/2013 at 10:30 AM, provide feedback and additional troubleshooting if need be.  Star Age, MD, PhD [facility]   ______

## 2013-12-01 ENCOUNTER — Encounter: Payer: Self-pay | Admitting: Neurology

## 2013-12-08 ENCOUNTER — Ambulatory Visit (INDEPENDENT_AMBULATORY_CARE_PROVIDER_SITE_OTHER): Payer: BC Managed Care – PPO | Admitting: Neurology

## 2013-12-08 ENCOUNTER — Encounter: Payer: Self-pay | Admitting: Neurology

## 2013-12-08 ENCOUNTER — Encounter (INDEPENDENT_AMBULATORY_CARE_PROVIDER_SITE_OTHER): Payer: Self-pay

## 2013-12-08 VITALS — BP 122/69 | HR 72 | Temp 98.4°F | Ht 67.0 in | Wt 245.0 lb

## 2013-12-08 DIAGNOSIS — E669 Obesity, unspecified: Secondary | ICD-10-CM

## 2013-12-08 DIAGNOSIS — Z9989 Dependence on other enabling machines and devices: Principal | ICD-10-CM

## 2013-12-08 DIAGNOSIS — G4733 Obstructive sleep apnea (adult) (pediatric): Secondary | ICD-10-CM

## 2013-12-08 DIAGNOSIS — G8929 Other chronic pain: Secondary | ICD-10-CM

## 2013-12-08 DIAGNOSIS — M25519 Pain in unspecified shoulder: Secondary | ICD-10-CM

## 2013-12-08 DIAGNOSIS — M25512 Pain in left shoulder: Secondary | ICD-10-CM

## 2013-12-08 HISTORY — DX: Obstructive sleep apnea (adult) (pediatric): G47.33

## 2013-12-08 NOTE — Patient Instructions (Signed)
Please continue using your CPAP regularly. While your insurance requires that you use CPAP at least 4 hours each night on 70% of the nights, I recommend, that you not skip any nights and use it throughout the night if you can. Getting used to CPAP does take time and patience and discipline. Untreated obstructive sleep apnea when it is moderate to severe can have an adverse impact on cardiovascular health and raise her risk for heart disease, arrhythmias, hypertension, congestive heart failure, stroke and diabetes. Untreated obstructive sleep apnea causes sleep disruption, nonrestorative sleep, and sleep deprivation. This can have an impact on your day to day functioning and cause daytime sleepiness and impairment of cognitive function, memory loss, mood disturbance, and problems focussing. Using CPAP regularly can improve these symptoms.  I will look at your next 30 day download and may ask you to have an overnight pulse oximetry test done, to make sure your oxygen level stays above 90%.   Follow up in 3 month.

## 2013-12-08 NOTE — Progress Notes (Signed)
Subjective:    Patient ID: Jacob House is a 48 y.o. male.  HPI    Interim history:   Jacob House is a very nice 48 year old right-handed gentleman with an underlying medical history of carpal tunnel syndrome, shoulder pain, obesity and recent flu, who presents for followup consultation of his severe obstructive sleep apnea. He is unaccompanied today. I first met him on 08/31/2013, at which time he reported snoring and excessive daytime somnolence as well as witnessed apneas. I requested he return for sleep study. He had a split-night sleep study on 10/06/2013 and I went over his test results with him in detail today. Baseline sleep efficiency was significantly reduced at 45.5% with a long sleep latency of 116.5 minutes and wake after sleep onset of 34 minutes with mild sleep fragmentation noted. He had an elevated arousal index. He had markedly increased percentage of stage II sleep, near absence of slow-wave sleep, and absence of REM sleep prior to CPAP. He had no clinically significant periodic leg movements or EKG changes. He had moderate to loud snoring. His total AHI was 57.8 per hour. Baseline oxygen saturation was 91%, nadir was 67%. He was then titrated on CPAP with sleep efficiency improved at 73.3% with a latency to sleep of 50.5 minutes and wake after sleep onset of 3.5 minutes with mild sleep fragmentation noted. His arousal index was normal. He had a increased percentage of stage II sleep, near absence of slow-wave sleep and an increased percentage of REM sleep at 34.4%. Average oxygen saturation was improved at 93%, nadir was 70% which was during the initial titration period. On the final pressure of 14 cm his O2 nadir was 91% but he did not achieve REM sleep on the final pressure. He was titrated from 4-14 cm with reduction of the AHI of 6.2 events per hour at the final pressure. Based on the test results I did start the patient on CPAP at a pressure of 15 cm as he had a residual AHI of over  5/h on the final pressure of 14 cm during the study. I reviewed his compliance data from 10/26/2013 through 11/07/2013 which is a total of 13 days during which time he was CPAP every night. Percent used days greater than 4 hours was 100%, average usage was 7 hours and 17 minutes. Residual AHI was 8 per hour on a pressure of 15 cm with no significant leak.  Today, there was no data on his SD card and we are awaiting his current 30 day download. His [employer] CPAP machine indicates that he has used it every day for the last 30 days. Today, he reports sleeping better with his CPAP and having adjusted fairly well to light. He does have mild residual sleepiness but overall feels improved. His nasal mask fits well for the most part but sometimes leaks. He had a mild cold recently it is still a little congested. He uses CPAP every night.  Her Past Medical History Is Significant For: Past Medical History  Diagnosis Date  . Carpal tunnel syndrome   . GERD (gastroesophageal reflux disease)   . Complication of anesthesia 04-23-2013    pt told by anesthesia after probably has sleep apnea  . Complication of anesthesia     cannot lie on left side due to recent shoulder surgery per pt  . Snoring     Her Past Surgical History Is Significant For: Past Surgical History  Procedure Laterality Date  . Gallbladder surgery    .  Anal fissure repair  july 2009 and yrs ago    x 2   . Cholecystectomy  1999  . Eye surgery Bilateral 2005    lasix  . Shoulder surgery Left sept 2014    cartlidge repair  . Fracture surgery Right     foot  . Esophagogastroduodenoscopy (egd) with propofol N/A 07/22/2013    Procedure: ESOPHAGOGASTRODUODENOSCOPY (EGD) WITH PROPOFOL;  Surgeon: Cleotis Nipper, MD;  Location: [facility];  Service: Endoscopy;  Laterality: N/A;  . Colonoscopy with propofol N/A 07/22/2013    Procedure: COLONOSCOPY WITH PROPOFOL;  Surgeon: Cleotis Nipper, MD;  Location: [facility];  Service:  Endoscopy;  Laterality: N/A;    Her Family History Is Significant For: Family History  Problem Relation Age of Onset  . Cancer Mother 61    colon, stomach  . Cancer Father     testicular  . Cancer Maternal Grandmother     ?colon  . Cancer Maternal Grandfather   . Cancer Maternal Uncle     colon  . Cancer Maternal Uncle     colon    Her Social History Is Significant For: History   Social History  . Marital Status: Significant Other    Spouse Name: Juliann Pulse    Number of Children: 0  . Years of Education: 16   Occupational History  . machine operator    Social History Main Topics  . Smoking status: Never Smoker   . Smokeless tobacco: Never Used  . Alcohol Use: 0.0 - 0.5 oz/week    0-1 drink(s) per week     Comment: rare  . Drug Use: No  . Sexual Activity: Yes    Partners: Female    Birth Control/ Protection: Condom     Comment: 1 sexual partner in last year   Other Topics Concern  . None   Social History Narrative   Lives with his significant other, Juliann Pulse, and their cats.    Her Allergies Are:  No Known Allergies:   Her Current Medications Are:  Outpatient Encounter Prescriptions as of 12/08/2013  Medication Sig  . DENTA 5000 PLUS 1.1 % CREA dental cream Take 1 application by mouth daily.  . Aspirin-Salicylamide-Caffeine (BC FAST PAIN RELIEF) 650-195-33.3 MG PACK Take 1 packet by mouth every 6 (six) hours as needed (pain).  . fluticasone (FLONASE) 50 MCG/ACT nasal spray Place 2 sprays into both nostrils daily.  Marland Kitchen HYDROcodone-acetaminophen (NORCO/VICODIN) 5-325 MG per tablet Take 1 tablet by mouth at bedtime as needed for moderate pain.  . naproxen (NAPROSYN) 500 MG tablet Take 1 tablet by mouth as needed.  Marland Kitchen oxyCODONE-acetaminophen (PERCOCET/ROXICET) 5-325 MG per tablet Take 1-2 tablets by mouth every 4 (four) hours as needed for moderate pain or severe pain.  . traMADol (ULTRAM) 50 MG tablet Take 50 mg by mouth at bedtime as needed for moderate pain.  .  [DISCONTINUED] benzonatate (TESSALON) 100 MG capsule TAKE 1 CAPSULE THREE TIMES DAILY AS NEEDED FOR COUGH.  . [DISCONTINUED] chlorpheniramine-HYDROcodone (TUSSIONEX PENNKINETIC ER) 10-8 MG/5ML LQCR Take 5 mLs by mouth every 12 (twelve) hours as needed for cough (cough).  . [DISCONTINUED] HYDROcodone-homatropine (HYCODAN) 5-1.5 MG/5ML syrup Take 5 mLs by mouth every 8 (eight) hours as needed for cough.  :  Review of Systems:  Out of a complete 14 point review of systems, all are reviewed and negative with the exception of these symptoms as listed below:   Review of Systems  Constitutional: Negative.   HENT: Negative.   Eyes: Negative.  Respiratory: Negative.   Cardiovascular: Negative.   Gastrointestinal: Positive for rectal pain.  Endocrine: Negative.   Genitourinary: Negative.   Musculoskeletal: Negative.   Skin: Negative.   Allergic/Immunologic: Negative.   Neurological: Negative.   Hematological: Negative.   Psychiatric/Behavioral: Negative.     Objective:  Neurologic Exam  Physical Exam Physical Examination:   Filed Vitals:   12/08/13 1031  BP: 122/69  Pulse: 72  Temp: 98.4 F (36.9 C)    General Examination: The patient is a very pleasant 48 y.o. male in no acute distress. He appears well-developed and well-nourished and well groomed.   HEENT: Normocephalic, atraumatic, pupils are equal, round and reactive to light and accommodation. Funduscopic exam is normal with sharp disc margins noted. Extraocular tracking is good without limitation to gaze excursion or nystagmus noted. Normal smooth pursuit is noted. Hearing is grossly intact. Face is symmetric with normal facial animation and normal facial sensation. Speech is clear with no dysarthria noted. There is no hypophonia. There is no lip, neck/head, jaw or voice tremor. Neck is supple with full range of passive and active motion. There are no carotid bruits on auscultation. Oropharynx exam reveals: severe mouth dryness,  adequate dental hygiene and moderate airway crowding, due to narrow airway entry and tonsillar size. Mallampati is class II. Tongue protrudes centrally and palate elevates symmetrically. Tonsils are 2+ in size. Neck size is 17 inches.   Chest: Clear to auscultation without wheezing, rhonchi or crackles noted.  Heart: S1+S2+0, regular and normal without murmurs, rubs or gallops noted.   Abdomen: Soft, non-tender and non-distended with normal bowel sounds appreciated on auscultation.  Extremities: There is no pitting edema in the distal lower extremities bilaterally. Pedal pulses are intact.  Skin: Warm and dry without trophic changes noted. There are no varicose veins.  Musculoskeletal: exam reveals no obvious joint deformities, tenderness or joint swelling or erythema, except decrease in ROM in his L shoulder.  Neurologically:  Mental status: The patient is awake, alert and oriented in all 4 spheres. His memory, attention, language and knowledge are appropriate. There is no aphasia, agnosia, apraxia or anomia. Speech is clear with normal prosody and enunciation. Thought process is linear. Mood is congruent and affect is normal.  Cranial nerves are as described above under HEENT exam. In addition, shoulder shrug is normal with equal shoulder height noted. Motor exam: Normal bulk, strength and tone is noted. There is no drift, tremor or rebound. Romberg is negative. Reflexes are 2+ throughout. Toes are downgoing bilaterally. Fine motor skills are intact.  Cerebellar testing shows no dysmetria or intention tremor on finger to nose testing. There is no truncal or gait ataxia.  Sensory exam is intact to light touch, pinprick, vibration, temperature sense in the upper and lower extremities.  Gait, station and balance are unremarkable. No veering to one side is noted. No leaning to one side is noted. Posture is age-appropriate and stance is narrow based. No problems turning are noted. He turns en bloc.  Tandem walk is unremarkable. Intact toe and heel stance is noted.               Assessment and Plan:   In summary, Jacob House is a very pleasant 48 y.o.-year old male with an underlying medical history of carpal tunnel syndrome, shoulder pain, obesity and recent flu, who presents for followup consultation of his severe obstructive sleep apnea which was recently diagnosed via a split-night sleep study. Today we talked about his sleep study results in detail  and also showed him the hepatogram for better understanding. He has been established on CPAP treatment and indicates full compliance and the first compliance data did confirm full compliance. I congratulated him in that regard but he does seem to have residual OSA with a residual AHI of 8 per hour on the initial down load. I am awaiting today's download. His O2 nadir on treatment with 70% and initially but on the final pressure of 14 cm his O2 nadir was 91%. As he did not achieve REM sleep in the final pressure we may want to proceed with a overnight pulse oximetry test for confirmation that his oxygen level is well maintained on CPAP treatment. However, I would like to see his 30 day down load first. His exam is stable and nonfocal. I encouraged him to continue to use CPAP regularly to help reduce cardiovascular risk.   We also talked about trying to maintaining a healthy lifestyle in general. I encouraged the patient to eat healthy, exercise daily and keep well hydrated, to keep a scheduled bedtime and wake time routine, to not skip any meals and eat healthy snacks in between meals and to have protein with every meal. I stressed the importance of regular exercise.   I answered all his questions today and the patient was in agreement with the above outlined plan. I would like to see the patient back in 3 months, sooner if the need arises and encouraged him to call with any interim questions, concerns, problems or updates.

## 2013-12-09 ENCOUNTER — Encounter: Payer: Self-pay | Admitting: Neurology

## 2013-12-15 NOTE — Progress Notes (Signed)
Quick Note:  I reviewed the patient's CPAP compliance data from 11/09/2013 to 12/08/2013, which is a total of 30 days, during which time the patient used CPAP every day. The average usage for all days was 6 hours and 39 minutes. The percent used days greater than 4 hours was 100 %, indicating superb compliance. The residual AHI was 5.6 per hour, indicating an adequate treatment pressure of 15 cwp. Air leak from the mask was low. I will review this data with the patient at the next office visit, which is currently routinely scheduled for 05/09/2014 at 2:30 PM, provide feedback and additional troubleshooting if need be.  Star Age, MD, PhD [facility] ([facility]   ______

## 2013-12-17 ENCOUNTER — Encounter: Payer: Self-pay | Admitting: Neurology

## 2014-03-24 ENCOUNTER — Encounter: Payer: Self-pay | Admitting: Neurology

## 2014-05-09 ENCOUNTER — Encounter: Payer: Self-pay | Admitting: Neurology

## 2014-05-09 ENCOUNTER — Ambulatory Visit (INDEPENDENT_AMBULATORY_CARE_PROVIDER_SITE_OTHER): Payer: BC Managed Care – PPO | Admitting: Neurology

## 2014-05-09 ENCOUNTER — Encounter (INDEPENDENT_AMBULATORY_CARE_PROVIDER_SITE_OTHER): Payer: Self-pay

## 2014-05-09 VITALS — BP 114/67 | HR 60 | Temp 93.5°F | Ht 67.0 in | Wt 237.0 lb

## 2014-05-09 DIAGNOSIS — G4710 Hypersomnia, unspecified: Secondary | ICD-10-CM

## 2014-05-09 DIAGNOSIS — Z9989 Dependence on other enabling machines and devices: Principal | ICD-10-CM

## 2014-05-09 DIAGNOSIS — E669 Obesity, unspecified: Secondary | ICD-10-CM

## 2014-05-09 DIAGNOSIS — R400 Somnolence: Secondary | ICD-10-CM

## 2014-05-09 DIAGNOSIS — G4733 Obstructive sleep apnea (adult) (pediatric): Secondary | ICD-10-CM

## 2014-05-09 NOTE — Patient Instructions (Addendum)
Please continue using your CPAP regularly. While your insurance requires that you use CPAP at least 4 hours each night on 70% of the nights, I recommend, that you not skip any nights and use it throughout the night if you can. Getting used to CPAP and staying with the treatment long term does take time and patience and discipline. Untreated obstructive sleep apnea when it is moderate to severe can have an adverse impact on cardiovascular health and raise her risk for heart disease, arrhythmias, hypertension, congestive heart failure, stroke and diabetes. Untreated obstructive sleep apnea causes sleep disruption, nonrestorative sleep, and sleep deprivation. This can have an impact on your day to day functioning and cause daytime sleepiness and impairment of cognitive function, memory loss, mood disturbance, and problems focussing. Using CPAP regularly can improve these symptoms.  We will have to refitted with your nasal mask.    We will also do a overnight oxygen test while you are using your CPAP to make sure your oxygen level is okay.

## 2014-05-09 NOTE — Progress Notes (Signed)
Subjective:    Patient ID: Jacob House is a 48 y.o. male.  HPI   Interim history:   Jacob House is a very nice 48 year old right-handed gentleman with an underlying medical history of carpal tunnel syndrome, shoulder pain, and obesity, who presents for followup consultation of his severe obstructive sleep apnea, treated with CPAP. He is unaccompanied today. I last saw him on 12/08/2013, at which time I advised him regarding his sleep study results and his compliance. He indicated using his CPAP regularly. There was no data on his SD card. His [employer] CPAP machine indicated that he had been using it every day for the previous 30 days. He reported sleeping well with CPAP and having adjusted fairly well to it. His sleepiness was improved. I asked him to continue using CPAP regularly. I reviewed the patient's CPAP compliance data from 11/09/2013 to 12/08/2013, which is a total of 30 days, during which time the patient used CPAP every day. The average usage for all days was 6 hours and 39 minutes. The percent used days greater than 4 hours was 100 %, indicating superb compliance. The residual AHI was 5.6 per hour, indicating an adequate treatment pressure of 15 cwp. Air leak from the mask was low.  Today, he reports being fully compliant with CPAP. He has residual daytime somnolence. He has some issues with mask fitting. He just got a new nose piece, [employer] nasal mask. He still does not always feel rested.  I reviewed his compliance data from 04/10/14 to 05/08/14, which is 30 days, during which time he was 100%, percent used days greater than 4 hours was 90%, indicating excellent compliance. Residual AHI was 3.1 per hour. Leak was at times quite high. He was given a prescription for Voltaren by his orthopedic doctor for his arthritis. He has not started it yet. He is trying to lose weight and has in fact been able to lose some weight since his last visit with me.  I first met him on 08/31/2013,  at which time he reported snoring and excessive daytime somnolence as well as witnessed apneas. I requested he return for sleep study. He had a split-night sleep study on 10/06/2013: Baseline sleep efficiency was significantly reduced at 45.5% with a long sleep latency of 116.5 minutes and wake after sleep onset of 34 minutes with mild sleep fragmentation noted. He had an elevated arousal index. He had markedly increased percentage of stage II sleep, near absence of slow-wave sleep, and absence of REM sleep prior to CPAP. He had no clinically significant periodic leg movements or EKG changes. He had moderate to loud snoring. His total AHI was 57.8 per hour. Baseline oxygen saturation was 91%, nadir was 67%. He was then titrated on CPAP with sleep efficiency improved at 73.3% with a latency to sleep of 50.5 minutes and wake after sleep onset of 3.5 minutes with mild sleep fragmentation noted. His arousal index was normal. He had a increased percentage of stage II sleep, near absence of slow-wave sleep and an increased percentage of REM sleep at 34.4%. Average oxygen saturation was improved at 93%, nadir was 70% which was during the initial titration period. On the final pressure of 14 cm his O2 nadir was 91% but he did not achieve REM sleep on the final pressure. He was titrated from 4-14 cm with reduction of the AHI of 6.2 events per hour at the final pressure. Based on the test results I did start the patient on CPAP at  a pressure of 15 cm as he had a residual AHI of over 5/h on the final pressure of 14 cm during the study.  I reviewed his compliance data from 10/26/2013 through 11/07/2013 which is a total of 13 days during which time he was CPAP every night. Percent used days greater than 4 hours was 100%, average usage was 7 hours and 17 minutes. Residual AHI was 8 per hour on a pressure of 15 cm with no significant leak.     His Past Medical History Is Significant For: Past Medical History  Diagnosis Date   . Carpal tunnel syndrome   . GERD (gastroesophageal reflux disease)   . Complication of anesthesia 04-23-2013    pt told by anesthesia after probably has sleep apnea  . Complication of anesthesia     cannot lie on left side due to recent shoulder surgery per pt  . Snoring   . OSA on CPAP 12/08/2013    His Past Surgical History Is Significant For: Past Surgical History  Procedure Laterality Date  . Gallbladder surgery    . Anal fissure repair  july 2009 and yrs ago    x 2   . Cholecystectomy  1999  . Eye surgery Bilateral 2005    lasix  . Shoulder surgery Left sept 2014    cartlidge repair  . Fracture surgery Right     foot  . Esophagogastroduodenoscopy (egd) with propofol N/A 07/22/2013    Procedure: ESOPHAGOGASTRODUODENOSCOPY (EGD) WITH PROPOFOL;  Surgeon: Cleotis Nipper, MD;  Location: [facility];  Service: Endoscopy;  Laterality: N/A;  . Colonoscopy with propofol N/A 07/22/2013    Procedure: COLONOSCOPY WITH PROPOFOL;  Surgeon: Cleotis Nipper, MD;  Location: [facility];  Service: Endoscopy;  Laterality: N/A;    His Family History Is Significant For: Family History  Problem Relation Age of Onset  . Cancer Mother 58    colon, stomach  . Cancer Father     testicular  . Cancer Maternal Grandmother     ?colon  . Cancer Maternal Grandfather   . Cancer Maternal Uncle     colon  . Cancer Maternal Uncle     colon    His Social History Is Significant For: History   Social History  . Marital Status: Significant Other    Spouse Name: Juliann Pulse    Number of Children: 0  . Years of Education: 16   Occupational History  . machine operator    Social History Main Topics  . Smoking status: Never Smoker   . Smokeless tobacco: Never Used  . Alcohol Use: 0.0 - 0.5 oz/week    0-1 drink(s) per week     Comment: rare  . Drug Use: No  . Sexual Activity: Yes    Partners: Female    Birth Control/ Protection: Condom     Comment: 1 sexual partner in last year   Other  Topics Concern  . Not on file   Social History Narrative   Lives with his significant other, Juliann Pulse, and their cats.    His Allergies Are:  No Known Allergies:   His Current Medications Are:  Outpatient Encounter Prescriptions as of 05/09/2014  Medication Sig  . Aspirin-Salicylamide-Caffeine (BC FAST PAIN RELIEF) 650-195-33.3 MG PACK Take 1 packet by mouth every 6 (six) hours as needed (pain).  . fluticasone (FLONASE) 50 MCG/ACT nasal spray Place 2 sprays into both nostrils daily.  . [DISCONTINUED] DENTA 5000 PLUS 1.1 % CREA dental cream Take 1 application by mouth  daily.  . [DISCONTINUED] HYDROcodone-acetaminophen (NORCO/VICODIN) 5-325 MG per tablet Take 1 tablet by mouth at bedtime as needed for moderate pain.  . [DISCONTINUED] naproxen (NAPROSYN) 500 MG tablet Take 1 tablet by mouth as needed.  . [DISCONTINUED] oxyCODONE-acetaminophen (PERCOCET/ROXICET) 5-325 MG per tablet Take 1-2 tablets by mouth every 4 (four) hours as needed for moderate pain or severe pain.  . [DISCONTINUED] traMADol (ULTRAM) 50 MG tablet Take 50 mg by mouth at bedtime as needed for moderate pain.  :  Review of Systems:  Out of a complete 14 point review of systems, all are reviewed and negative with the exception of these symptoms as listed below:   Review of Systems  Gastrointestinal: Positive for rectal pain.  Musculoskeletal:       Joint pain    Objective:  Neurologic Exam  Physical Exam Physical Examination:   Filed Vitals:   05/09/14 1508  BP: 114/67  Pulse: 60  Temp: 93.5 F (34.2 C)    General Examination: The patient is a very pleasant 48 y.o. male in no acute distress. He appears well-developed and well-nourished and well groomed.   HEENT: Normocephalic, atraumatic, pupils are equal, round and reactive to light and accommodation. Funduscopic exam is normal with sharp disc margins noted. Extraocular tracking is good without limitation to gaze excursion or nystagmus noted. Normal smooth  pursuit is noted. Hearing is grossly intact. Face is symmetric with normal facial animation and normal facial sensation. Speech is clear with no dysarthria noted. There is no hypophonia. There is no lip, neck/head, jaw or voice tremor. Neck is supple with full range of passive and active motion. There are no carotid bruits on auscultation. Oropharynx exam reveals: severe mouth dryness, adequate dental hygiene and moderate airway crowding, due to narrow airway entry and tonsillar size. Mallampati is class II. Tongue protrudes centrally and palate elevates symmetrically. Tonsils are 2+ in size.   Chest: Clear to auscultation without wheezing, rhonchi or crackles noted.  Heart: S1+S2+0, regular and normal without murmurs, rubs or gallops noted.   Abdomen: Soft, non-tender and non-distended with normal bowel sounds appreciated on auscultation.  Extremities: There is no pitting edema in the distal lower extremities bilaterally. Pedal pulses are intact.  Skin: Warm and dry without trophic changes noted. There are no varicose veins.  Musculoskeletal: exam reveals no obvious joint deformities, tenderness or joint swelling or erythema, except decrease in ROM in his L shoulder.  Neurologically:  Mental status: The patient is awake, alert and oriented in all 4 spheres. His memory, attention, language and knowledge are appropriate. There is no aphasia, agnosia, apraxia or anomia. Speech is clear with normal prosody and enunciation. Thought process is linear. Mood is congruent and affect is normal.  Cranial nerves are as described above under HEENT exam. In addition, shoulder shrug is normal with equal shoulder height noted. Motor exam: Normal bulk, strength and tone is noted. There is no drift, tremor or rebound. Romberg is negative. Reflexes are 2+ throughout. Toes are downgoing bilaterally. Fine motor skills are intact.  Cerebellar testing shows no dysmetria or intention tremor on finger to nose testing.  There is no truncal or gait ataxia.  Sensory exam is intact to light touch, pinprick, vibration, temperature sense in the upper and lower extremities.  Gait, station and balance are unremarkable. No veering to one side is noted. No leaning to one side is noted. Posture is age-appropriate and stance is narrow based. No problems turning are noted. He turns en bloc. Tandem walk is  unremarkable. Intact toe and heel stance is noted.               Assessment and Plan:   In summary, Jacob House is a very pleasant 48 year old male with an underlying medical history of carpal tunnel syndrome, shoulder pain, arthritis and obesity, who presents for followup consultation of his severe obstructive sleep apnea treated with CPAP of 15 cm. today, we again talked about his sleep test results and his compliance data. He is fully compliant with treatment. Nevertheless, he has residual daytime tiredness and difficulty with mask fitting. I advised him to make an appointment with his DME company for better mask fitting. In addition, I would like to proceed with an overnight pulse oximetry test to make sure his oxygen level remains in the 90s while on treatment with CPAP at a level of 15 cm. I congratulated him on his great compliance. I encouraged him to use CPAP regularly without skipping any nights. His exam is stable and nonfocal. He has been able to lose some weight. I encouraged him to continue to use CPAP regularly to help reduce cardiovascular risk.   We also talked about trying to maintaining a healthy lifestyle in general. I encouraged the patient to eat healthy, exercise daily and keep well hydrated, to keep a scheduled bedtime and wake time routine, to not skip any meals and eat healthy snacks in between meals and to have protein with every meal. I stressed the importance of regular exercise.   I answered all his questions today and the patient was in agreement with the above outlined plan. I would like to see the  patient back in 4 months, sooner if the need arises and encouraged him to call with any interim questions, concerns, problems or updates.

## 2014-08-24 ENCOUNTER — Telehealth: Payer: Self-pay | Admitting: Neurology

## 2014-08-24 DIAGNOSIS — Z9989 Dependence on other enabling machines and devices: Principal | ICD-10-CM

## 2014-08-24 DIAGNOSIS — G4733 Obstructive sleep apnea (adult) (pediatric): Secondary | ICD-10-CM

## 2014-08-24 NOTE — Telephone Encounter (Signed)
CPAP supply order placed. pls fax to his DME company, thx

## 2014-08-24 NOTE — Telephone Encounter (Signed)
Pt called to request a new mask and refitting for a different mask type.  He is also in need of new CPAP supplies.

## 2014-09-13 ENCOUNTER — Ambulatory Visit: Payer: BC Managed Care – PPO | Admitting: Neurology

## 2014-09-23 ENCOUNTER — Encounter: Payer: Self-pay | Admitting: Neurology

## 2014-10-05 NOTE — Progress Notes (Signed)
Quick Note:  I reviewed the patient's overnight pulse oximetry report from 09/23/2014 well on CPAP therapy at night. His average oxygen saturation was 95.2%, total test time was 6 hours and 11 minutes. Lowest oxygen saturation was 86%. Time below 88% saturation was 28 seconds. Based on these test results, it appears that the patient is appropriately treated on room air with CPAP only and does not require additional supplemental oxygen at this time. Average pulse rate was 60.2 bpm, maximum was 99 bpm, minimum was 46 bpm. Please advise patient that he can continue follow-up as scheduled. He should continue using his CPAP regularly as previously discussed.  Star Age, MD, PhD [facility] ([facility]  ______

## 2014-11-14 ENCOUNTER — Encounter: Payer: Self-pay | Admitting: Neurology

## 2014-11-17 ENCOUNTER — Encounter: Payer: Self-pay | Admitting: Neurology

## 2014-11-17 ENCOUNTER — Ambulatory Visit: Payer: Self-pay | Admitting: Physician Assistant

## 2014-11-17 ENCOUNTER — Ambulatory Visit (INDEPENDENT_AMBULATORY_CARE_PROVIDER_SITE_OTHER): Payer: BLUE CROSS/BLUE SHIELD | Admitting: Neurology

## 2014-11-17 VITALS — BP 112/68 | HR 78 | Resp 18

## 2014-11-17 DIAGNOSIS — E669 Obesity, unspecified: Secondary | ICD-10-CM | POA: Diagnosis not present

## 2014-11-17 DIAGNOSIS — M545 Low back pain, unspecified: Secondary | ICD-10-CM

## 2014-11-17 DIAGNOSIS — G4733 Obstructive sleep apnea (adult) (pediatric): Secondary | ICD-10-CM

## 2014-11-17 DIAGNOSIS — Z9989 Dependence on other enabling machines and devices: Principal | ICD-10-CM

## 2014-11-17 NOTE — Patient Instructions (Signed)
Use a head pad to your lower back and try an antiinflammatory before bedtime, such as naproxen, Advil or Ibuprofen, try to have the area massaged.   Please continue using your CPAP regularly. While your insurance requires that you use CPAP at least 4 hours each night on 70% of the nights, I recommend, that you not skip any nights and use it throughout the night if you can. Getting used to CPAP and staying with the treatment long term does take time and patience and discipline. Untreated obstructive sleep apnea when it is moderate to severe can have an adverse impact on cardiovascular health and raise her risk for heart disease, arrhythmias, hypertension, congestive heart failure, stroke and diabetes. Untreated obstructive sleep apnea causes sleep disruption, nonrestorative sleep, and sleep deprivation. This can have an impact on your day to day functioning and cause daytime sleepiness and impairment of cognitive function, memory loss, mood disturbance, and problems focussing. Using CPAP regularly can improve these symptoms.  Keep up the good work! I will see you back in 12 months for sleep apnea check up.

## 2014-11-17 NOTE — Progress Notes (Addendum)
Subjective:    Patient ID: Jacob House is a 49 y.o. male.  HPI     Interim history:  Mr. Jacob House is a very nice 47-year-old right-handed gentleman with an underlying medical history of carpal tunnel syndrome, shoulder pain, and obesity, who presents for followup consultation of his severe obstructive sleep apnea, treated with CPAP. He is unaccompanied today. I last saw him on 05/09/2014, at which time he reported compliance with treatment. He had some residual daytime somnolence and some issues with mask fitting. He was using a East on nasal mask. He was still not always fully rested in the mornings. He was given a prescription for Voltaren by his orthopedic doctor for his arthritis. He was trying to lose weight and had in fact lost some weight. I asked him to meet with his DME provider for mask refit. I also suggested we proceed with an overnight pulse oximetry test to ensure that his oxygen saturations were fine on treatment.  I reviewed the patient's overnight pulse oximetry report from 09/23/2014 while on CPAP therapy at night. His average oxygen saturation was 95.2%, total test time was 6 hours and 11 minutes. Lowest oxygen saturation was 86%. Time below 88% saturation was 28 seconds. Based on these test results, it appears that the patient is appropriately treated on room air with CPAP only and does not require additional supplemental oxygen at this time. Average pulse rate was 60.2 bpm, maximum was 99 bpm, minimum was 46 bpm during the testing time.   Today, 11/17/2014: I will review his CPAP compliance data from the most recent download. He states that he dropped off his card at his DME office a month and a half ago or so for an updated download. We are awaiting a faxed report from his DME, [employer]. He reports to be fully compliant with treatment. He was not aware of his pulse oximetry test results and I reviewed that with him today. I reassured him that he did not need additional supplemental  oxygen thankfully. He changed from a nasal mask to nasal pillows mask in mid February. He likes this mask. He did not need any new supplies. In the past few weeks he's had low back pain. This is a midline back pain, described as a aching in the paraspinal area in the lower back. It is more in the left and he had noticed a little bump on the left side. He has an appointment with his primary care physician next week for this. He has a remote history of back injury and back pain. He has not taken anything for this. He has not tried heat. It is worse when he lays down to sleep at night. It is better when he is walking and upright. He does not have any sciatica type symptoms. He has no new numbness or tingling.  Addendum 11/17/2014: Later in the afternoon I received his CPAP compliance data from his DME company via fax. This was from 08/24/2014 through 09/22/2014 which is a total of 30 days during which time he used his machine every night with percent used days greater than 4 hours at 100%, indicating superb compliance with an average usage of 6 hours and 35 minutes. Residual AHI acceptable at 3.9 per hour on a pressure of 15 cm and leak low. This indicates superb compliance and of course supports his verbal report of full compliance.   Previously:  I reviewed his compliance data from 04/10/14 to 05/08/14, which is 30 days, during which time he   was 100%, percent used days greater than 4 hours was 90%, indicating excellent compliance. Residual AHI was 3.1 per hour. Leak was at times quite high.   I saw him on 12/08/2013, at which time I advised him regarding his sleep study results and his compliance. He indicated using his CPAP regularly. There was no data on his SD card. His [employer] CPAP machine indicated that he had been using it every day for the previous 30 days. He reported sleeping well with CPAP and having adjusted fairly well to it. His sleepiness was improved. I asked him to continue using CPAP  regularly.  I reviewed the patient's CPAP compliance data from 11/09/2013 to 12/08/2013, which is a total of 30 days, during which time the patient used CPAP every day. The average usage for all days was 6 hours and 39 minutes. The percent used days greater than 4 hours was 100 %, indicating superb compliance. The residual AHI was 5.6 per hour, indicating an adequate treatment pressure of 15 cwp. Air leak from the mask was low.  I first met him on 08/31/2013, at which time he reported snoring and excessive daytime somnolence as well as witnessed apneas. I requested he return for sleep study. He had a split-night sleep study on 10/06/2013: Baseline sleep efficiency was significantly reduced at 45.5% with a long sleep latency of 116.5 minutes and wake after sleep onset of 34 minutes with mild sleep fragmentation noted. He had an elevated arousal index. He had markedly increased percentage of stage II sleep, near absence of slow-wave sleep, and absence of REM sleep prior to CPAP. He had no clinically significant periodic leg movements or EKG changes. He had moderate to loud snoring. His total AHI was 57.8 per hour. Baseline oxygen saturation was 91%, nadir was 67%. He was then titrated on CPAP with sleep efficiency improved at 73.3% with a latency to sleep of 50.5 minutes and wake after sleep onset of 3.5 minutes with mild sleep fragmentation noted. His arousal index was normal. He had a increased percentage of stage II sleep, near absence of slow-wave sleep and an increased percentage of REM sleep at 34.4%. Average oxygen saturation was improved at 93%, nadir was 70% which was during the initial titration period. On the final pressure of 14 cm his O2 nadir was 91% but he did not achieve REM sleep on the final pressure. He was titrated from 4-14 cm with reduction of the AHI of 6.2 events per hour at the final pressure. Based on the test results I did start the patient on CPAP at a pressure of 15 cm as he had a  residual AHI of over 5/h on the final pressure of 14 cm during the study.    I reviewed his compliance data from 10/26/2013 through 11/07/2013 which is a total of 13 days during which time he was CPAP every night. Percent used days greater than 4 hours was 100%, average usage was 7 hours and 17 minutes. Residual AHI was 8 per hour on a pressure of 15 cm with no significant leak.   His Past Medical History Is Significant For: Past Medical History  Diagnosis Date  . Carpal tunnel syndrome   . GERD (gastroesophageal reflux disease)   . Complication of anesthesia 04-23-2013    pt told by anesthesia after probably has sleep apnea  . Complication of anesthesia     cannot lie on left side due to recent shoulder surgery per pt  . Snoring   .  OSA on CPAP 12/08/2013    His Past Surgical History Is Significant For: Past Surgical History  Procedure Laterality Date  . Gallbladder surgery    . Anal fissure repair  july 2009 and yrs ago    x 2   . Cholecystectomy  1999  . Eye surgery Bilateral 2005    lasix  . Shoulder surgery Left sept 2014    cartlidge repair  . Fracture surgery Right     foot  . Esophagogastroduodenoscopy (egd) with propofol N/A 07/22/2013    Procedure: ESOPHAGOGASTRODUODENOSCOPY (EGD) WITH PROPOFOL;  Surgeon: Robert V Buccini, MD;  Location: [facility];  Service: Endoscopy;  Laterality: N/A;  . Colonoscopy with propofol N/A 07/22/2013    Procedure: COLONOSCOPY WITH PROPOFOL;  Surgeon: Robert V Buccini, MD;  Location: [facility];  Service: Endoscopy;  Laterality: N/A;    His Family History Is Significant For: Family History  Problem Relation Age of Onset  . Cancer Mother 40    colon, stomach  . Cancer Father     testicular  . Cancer Maternal Grandmother     ?colon  . Cancer Maternal Grandfather   . Cancer Maternal Uncle     colon  . Cancer Maternal Uncle     colon    His Social History Is Significant For: History   Social History  . Marital Status:  Significant Other    Spouse Name: Kathy  . Number of Children: 0  . Years of Education: 16   Occupational History  . machine operator    Social History Main Topics  . Smoking status: Never Smoker   . Smokeless tobacco: Never Used  . Alcohol Use: 0.0 - 0.6 oz/week    0-1 Standard drinks or equivalent per week     Comment: rare  . Drug Use: No  . Sexual Activity:    Partners: Female    Birth Control/ Protection: Condom     Comment: 1 sexual partner in last year   Other Topics Concern  . None   Social History Narrative   Lives with his significant other, Kathy, and their cats.    His Allergies Are:  No Known Allergies:   His Current Medications Are:  Outpatient Encounter Prescriptions as of 11/17/2014  Medication Sig  . Aspirin-Salicylamide-Caffeine (BC FAST PAIN RELIEF) [phone] MG PACK Take 1 packet by mouth every 6 (six) hours as needed (pain).  . fluticasone (FLONASE) 50 MCG/ACT nasal spray Place 2 sprays into both nostrils daily.  :  Review of Systems:  Out of a complete 14 point review of systems, all are reviewed and negative with the exception of these symptoms as listed below:   Review of Systems  Musculoskeletal:       Back pain during sleep/ Reports "bump" on lower part of back which he is getting checked next week.    Objective:  Neurologic Exam  Physical Exam Physical Examination:   Filed Vitals:   11/17/14 1108  BP: 112/68  Pulse: 78  Resp: 18   General Examination: The patient is a very pleasant 48 y.o. male in no acute distress. He appears well-developed and well-nourished and well groomed. He is in good spirits today.  HEENT: Normocephalic, atraumatic, pupils are equal, round and reactive to light and accommodation. Funduscopic exam is normal with sharp disc margins noted. Extraocular tracking is good without limitation to gaze excursion or nystagmus noted. Normal smooth pursuit is noted. Hearing is grossly intact. Face is symmetric with  normal facial animation and normal   facial sensation. Speech is clear with no dysarthria noted. There is no hypophonia. There is no lip, neck/head, jaw or voice tremor. Neck is supple with full range of passive and active motion. There are no carotid bruits on auscultation. Oropharynx exam reveals: Mild mouth dryness, adequate dental hygiene and moderate airway crowding, due to narrow airway entry and tonsillar size. Mallampati is class II. Tongue protrudes centrally and palate elevates symmetrically. Tonsils are 2+ in size.   Chest: Clear to auscultation without wheezing, rhonchi or crackles noted.  Heart: S1+S2+0, regular and normal without murmurs, rubs or gallops noted.   Abdomen: Soft, non-tender and non-distended with normal bowel sounds appreciated on auscultation.  Extremities: There is no pitting edema in the distal lower extremities bilaterally. Pedal pulses are intact.  Skin: Warm and dry without trophic changes noted. There are no varicose veins.  Musculoskeletal: exam reveals no obvious joint deformities, tenderness or joint swelling or erythema, except slight decrease in ROM in his L shoulder. upon palpation of his lower back, he has muscular tenderness in the left more than right lower back. He is not tender upon palpation of his spine. He has no radiating pain. He has what appears to be muscular tightness in the left lower back.   Neurologically:  Mental status: The patient is awake, alert and oriented in all 4 spheres. His memory, attention, language and knowledge are appropriate. There is no aphasia, agnosia, apraxia or anomia. Speech is clear with normal prosody and enunciation. Thought process is linear. Mood is congruent and affect is normal.  Cranial nerves are as described above under HEENT exam. In addition, shoulder shrug is normal with equal shoulder height noted. Motor exam: Normal bulk, strength and tone is noted. There is no drift, tremor or rebound. Romberg is negative.  Reflexes are 2+ throughout. Fine motor skills are intact.  Cerebellar testing shows no dysmetria or intention tremor on finger to nose testing. There is no truncal or gait ataxia.  Sensory exam is intact to light touch in the upper and lower extremities.  Gait, station and balance are unremarkable. No veering to one side is noted. No leaning to one side is noted. Posture is age-appropriate and stance is narrow based. No problems turning are noted. He turns en bloc.   Assessment and Plan:   In summary, Iris Kochanski is a very pleasant 48-year old male with an underlying medical history of carpal tunnel syndrome, shoulder pain, arthritis and obesity, who presents for followup consultation of his severe obstructive sleep apnea treated with CPAP of 15 cm.  we talked about his original sleep testing. We talked about his recent pulse oximetry test which indicated that he did not require supplemental oxygen and had resolution of his severe oxygen desaturations with CPAP treatment at the current level. He has been compliant with treatment. He had some issues with leak in the past but changed from a nasal mask to nasal pillows interface. I am awaiting compliance data results which are being faxed today. He has developed midline low back pain, left more than right. He has what appears to be muscle tenderness and muscle spasm on the left side in the lower back area. I've advised him to use a heat pad and try an anti-inflammatory such as Advil, ibuprofen or naproxen. He says he has some naproxen at home. A gentle massage may help as well. He has an appointment with his primary care provider for this next week. From my end of things he is doing well.   He has benefited from CPAP therapy and has been compliant. His physical exam and neurological exam otherwise nonfocal. He is encouraged to lose weight.  We also talked about trying to maintaining a healthy lifestyle in general. I encouraged the patient to eat healthy,  exercise daily and keep well hydrated, to keep a scheduled bedtime and wake time routine, to not skip any meals and eat healthy snacks in between meals and to have protein with every meal. I stressed the importance of regular exercise.  I would like to see him back routinely in about a year for sleep apnea checkup. If he has any interim questions or concerns she is encouraged to call or email. I answered all his questions today and he was in agreement.  I spent 25 minutes in total face-to-face time with the patient, more than 50% of which was spent in counseling and coordination of care, reviewing test results, reviewing medication and discussing or reviewing the diagnosis of OSA and LBP, the prognosis and treatment options.  

## 2014-11-18 ENCOUNTER — Telehealth: Payer: Self-pay

## 2014-11-18 NOTE — Telephone Encounter (Signed)
Per Dr. Guadelupe Sabin request, I spoke to patient about his CPAP down load. Patient is aware that it appears that he is doing very well on CPAP. He is using it 100% of the time and more than 4 hours. <5 apneas a hour. Patient was advised to keep up the good work and call us if any questions.

## 2014-11-21 ENCOUNTER — Ambulatory Visit (INDEPENDENT_AMBULATORY_CARE_PROVIDER_SITE_OTHER): Payer: BLUE CROSS/BLUE SHIELD

## 2014-11-21 ENCOUNTER — Ambulatory Visit (INDEPENDENT_AMBULATORY_CARE_PROVIDER_SITE_OTHER): Payer: BLUE CROSS/BLUE SHIELD | Admitting: Urgent Care

## 2014-11-21 VITALS — BP 120/68 | HR 63 | Temp 97.8°F | Resp 18 | Ht 67.0 in | Wt 243.0 lb

## 2014-11-21 DIAGNOSIS — M545 Low back pain, unspecified: Secondary | ICD-10-CM

## 2014-11-21 DIAGNOSIS — R109 Unspecified abdominal pain: Secondary | ICD-10-CM

## 2014-11-21 LAB — COMPREHENSIVE METABOLIC PANEL
ALK PHOS: 58 U/L (ref 39–117)
ALT: 32 U/L (ref 0–53)
AST: 21 U/L (ref 0–37)
Albumin: 4.3 g/dL (ref 3.5–5.2)
BILIRUBIN TOTAL: 0.6 mg/dL (ref 0.2–1.2)
BUN: 10 mg/dL (ref 6–23)
CHLORIDE: 103 meq/L (ref 96–112)
CO2: 29 mEq/L (ref 19–32)
CREATININE: 0.89 mg/dL (ref 0.50–1.35)
Calcium: 9.5 mg/dL (ref 8.4–10.5)
Glucose, Bld: 96 mg/dL (ref 70–99)
Potassium: 4.8 mEq/L (ref 3.5–5.3)
Sodium: 139 mEq/L (ref 135–145)
Total Protein: 7.3 g/dL (ref 6.0–8.3)

## 2014-11-21 LAB — POCT UA - MICROSCOPIC ONLY
Bacteria, U Microscopic: NEGATIVE
CASTS, UR, LPF, POC: NEGATIVE
CRYSTALS, UR, HPF, POC: NEGATIVE
Mucus, UA: NEGATIVE
WBC, Ur, HPF, POC: NEGATIVE
Yeast, UA: NEGATIVE

## 2014-11-21 LAB — POCT URINALYSIS DIPSTICK
Bilirubin, UA: NEGATIVE
Glucose, UA: NEGATIVE
Ketones, UA: NEGATIVE
Leukocytes, UA: NEGATIVE
Nitrite, UA: NEGATIVE
PROTEIN UA: NEGATIVE
SPEC GRAV UA: 1.025
UROBILINOGEN UA: 0.2
pH, UA: 5.5

## 2014-11-21 NOTE — Patient Instructions (Addendum)

## 2014-11-21 NOTE — Progress Notes (Signed)
MRN: [id number] DOB: 01-13-1966  Subjective:   Jacob House is a 50 y.o. male presenting for chief complaint of Flank Pain and Cyst  Flank pain - reports 2 month history of left-sided flank pain, pain is sharp in nature, intermittent, aggravated with some movements such as standing after sitting or bending, non-radiating. Has not tried any medications for pain relief. Denies fevers, n/v, abdominal pain, cloudy urine, hematuria, dysuria. Denies any eliciting factors. Denies history of renal stones, UTI.   Knot over back - reports 1 year history along the left side of his mid-back. Girlfriend noticed the knot ~1 year ago, does not bother patient unless he lays down, feels pain from knot across his left back, pain is achy in nature, non-radiating. His sleep has been interrupted some by this pain, gets better if he sleeps on his right/left side. Has tried Advil with some relief. Admits history of low back pain (since 1991) related work injury but this problem is different from his history of low back pain. Also admits longstanding history of anal fissures, has undergone 2 surgical procedures with minimal relief of his fissures, takes Miralax daily as instructed by his PCP, has ~2 soft BM daily. Denies history of back surgeries. ROS as above, also denies decreased ROM, decreased sensation, numbness and tingling in legs, feet.   Denies smoking, rare alcohol drink. Denies any other aggravating or relieving factors, no other questions or concerns.  Jacob House has a current medication list which includes the following prescription(s): aspirin-salicylamide-caffeine, fexofenadine, fluticasone, and polyethylene glycol 3350, and the following Facility-Administered Medications: ibuprofen. He has No Known Allergies.  Jacob House  has a past medical history of Carpal tunnel syndrome; GERD (gastroesophageal reflux disease); Complication of anesthesia (04-23-2013); Complication of anesthesia; Snoring; and OSA on CPAP  (12/08/2013). Also  has past surgical history that includes Gallbladder surgery; Anal fissure repair (july 2009 and yrs ago); Cholecystectomy (1999); Eye surgery (Bilateral, 2005); Shoulder surgery (Left, sept 2014); Fracture surgery (Right); Esophagogastroduodenoscopy (egd) with propofol (N/A, 07/22/2013); and Colonoscopy with propofol (N/A, 07/22/2013).  ROS As in subjective.  Objective:   Vitals: BP 120/68 mmHg  Pulse 63  Temp(Src) 97.8 F (36.6 C) (Oral)  Resp 18  Ht 5\' 7"  (1.702 m)  Wt 243 lb (110.224 kg)  BMI 38.05 kg/m2  SpO2 98%  Physical Exam  Constitutional: He is well-developed, well-nourished, and in no distress.  Cardiovascular: Normal rate.   Pulmonary/Chest: Effort normal.  Abdominal: Soft. Bowel sounds are normal. He exhibits distension. He exhibits no mass. There is tenderness (generalized).  No peri-umbilical or flank ecchymosis. No CVA tenderness.  Musculoskeletal: Normal range of motion. He exhibits no edema or tenderness.       Lumbar back: He exhibits normal range of motion, no tenderness, no bony tenderness, no swelling, no edema, no deformity and no laceration.       Back:  No CVA tenderness.  Skin: Skin is warm and dry. No rash noted. No erythema. No pallor.   [facility] reading (PRIMARY) by  Dr. Laney Pastor and PA-Kenwood Rosiak. Lumbar: normal lumbar x-ray.  Dg Lumbar Spine Complete  11/21/2014   CLINICAL DATA:  Low back pain without mention of injury.  EXAM: LUMBAR SPINE - COMPLETE 4+ VIEW  COMPARISON:  Lumbar spine series of June 04, 2008  FINDINGS: The lumbar vertebral bodies are preserved in height. There is no significant disc space narrowing. There is no spondylolisthesis. The facet joints are unremarkable. The pedicles are intact.  IMPRESSION: There is no acute or significant chronic  bony abnormality of the lumbar spine.   Electronically Signed   By: David  Martinique   On: 11/21/2014 14:12   Results for orders placed or performed in visit on 11/21/14 (from the past  24 hour(s))  POCT urinalysis dipstick     Status: None   Collection Time: 11/21/14  1:30 PM  Result Value Ref Range   Color, UA yellow    Clarity, UA clear    Glucose, UA neg    Bilirubin, UA neg    Ketones, UA neg    Spec Grav, UA 1.025    Blood, UA small    pH, UA 5.5    Protein, UA neg    Urobilinogen, UA 0.2    Nitrite, UA neg    Leukocytes, UA Negative   POCT UA - Microscopic Only     Status: None   Collection Time: 11/21/14  1:30 PM  Result Value Ref Range   WBC, Ur, HPF, POC neg    RBC, urine, microscopic 7-10    Bacteria, U Microscopic neg    Mucus, UA neg    Epithelial cells, urine per micros 0-1    Crystals, Ur, HPF, POC neg    Casts, Ur, LPF, POC neg    Yeast, UA neg    Assessment and Plan :   1. Left flank pain 2. Left-sided low back pain without sciatica - Physical exam and x-ray findings reassuring. Will manage conservatively, discussed likelihood of his flank pain being due to renal stone. Advised aggressive hydration, 64 ounces daily, counseled on signs of developing infection and recommended he return to clinic if these develop. Also provided anticipatory guidance and stated that if his pain increased we may need to use opioid type medication but would need to return to clinic to rule out other acute process including compromised renal function, infection. Patient agreed. - Patient may have small superficial cyst over his left lateral back as depicted, unlikely to be causing his symptoms, will monitor. - Follow up in 4 weeks.  Jaynee Eagles, PA-C [facility] [phone] 11/21/2014 12:54 PM

## 2014-11-25 ENCOUNTER — Ambulatory Visit: Payer: Self-pay | Admitting: Family Medicine

## 2014-12-20 ENCOUNTER — Ambulatory Visit (INDEPENDENT_AMBULATORY_CARE_PROVIDER_SITE_OTHER): Payer: BLUE CROSS/BLUE SHIELD | Admitting: Physician Assistant

## 2014-12-20 VITALS — BP 105/75 | HR 92 | Temp 98.6°F | Resp 18 | Wt 242.0 lb

## 2014-12-20 DIAGNOSIS — L259 Unspecified contact dermatitis, unspecified cause: Secondary | ICD-10-CM | POA: Diagnosis not present

## 2014-12-20 DIAGNOSIS — J069 Acute upper respiratory infection, unspecified: Secondary | ICD-10-CM | POA: Diagnosis not present

## 2014-12-20 MED ORDER — BETAMETHASONE DIPROPIONATE 0.05 % EX CREA: TOPICAL_CREAM | Freq: Two times a day (BID) | CUTANEOUS | Status: DC

## 2014-12-20 MED ORDER — IPRATROPIUM BROMIDE 0.03 % NA SOLN: 2.0000 | Freq: Two times a day (BID) | NASAL | Status: DC

## 2014-12-20 MED ORDER — BENZONATATE 100 MG PO CAPS: 100.0000 mg | ORAL_CAPSULE | Freq: Three times a day (TID) | ORAL | Status: DC | PRN

## 2014-12-20 NOTE — Progress Notes (Signed)
  Medical screening examination/treatment/procedure(s) were performed by non-physician practitioner and as supervising physician I was immediately available for consultation/collaboration.     

## 2014-12-20 NOTE — Progress Notes (Signed)
   Subjective:    Patient ID: Jacob House, male    DOB: 12/04/1965, 49 y.o.   MRN: [id number]  HPI Patient presents for URI sx and rash. Has had nasal congestion, sore throat, dry cough, and rhinorrhea for past 4 days and denies sinus/ear pressure, fever, sneezing, N/V, and SOB/CP. Denies sick contacts. Does not smoke. Has tried allegra without relief and cepacol helps. NKDA.  Rash has been on right arm for past 3 days and has not changed in color or grown. Itches a little, but is not painful. 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 Denies working in the yard.   Review of Systems As noted above.     Objective:   Physical Exam  Constitutional: He is oriented to person, place, and time. He appears well-developed and well-nourished. No distress.  Blood pressure 105/75, pulse 92, temperature 98.6 F (37 C), temperature source Oral, resp. rate 18, weight 242 lb (109.77 kg), SpO2 98 %.  HENT:  Head: Normocephalic and atraumatic.  Right Ear: Tympanic membrane, external ear and ear canal normal.  Left Ear: Tympanic membrane and ear canal normal.  Nose: Rhinorrhea present. No mucosal edema. Right sinus exhibits no maxillary sinus tenderness and no frontal sinus tenderness. Left sinus exhibits no maxillary sinus tenderness and no frontal sinus tenderness.  Mouth/Throat: Uvula is midline and mucous membranes are normal. Posterior oropharyngeal erythema (marginal) present. No oropharyngeal exudate or posterior oropharyngeal edema.  Eyes: Conjunctivae are normal. Pupils are equal, round, and reactive to light. Right eye exhibits no discharge. Left eye exhibits no discharge. No scleral icterus.  Neck: Normal range of motion. Neck supple. No thyromegaly present.  Cardiovascular: Normal rate, regular rhythm and normal heart sounds.  Exam reveals no gallop and no friction rub.   No murmur heard. Pulmonary/Chest: Effort normal and breath sounds  normal. No respiratory distress. He has no wheezes. He has no rales. He exhibits no tenderness.  Abdominal: Soft. Bowel sounds are normal. He exhibits no distension and no mass. There is no tenderness. There is no rebound and no guarding.  Lymphadenopathy:    He has no cervical adenopathy.  Neurological: He is alert and oriented to person, place, and time.  Skin: Skin is warm and dry. Rash noted. He is not diaphoretic. No erythema. No pallor.       Assessment & Plan:  1. Acute upper respiratory infection Plenty of water and rest. Can continue to use cepacol for sore throat. - benzonatate (TESSALON) 100 MG capsule; Take 1-2 capsules (100-200 mg total) by mouth 3 (three) times daily as needed for cough.  Dispense: 40 capsule; Refill: 0 - ipratropium (ATROVENT) 0.03 % nasal spray; Place 2 sprays into both nostrils 2 (two) times daily.  Dispense: 30 mL; Refill: 0  2. Contact dermatitis If not better in 1 week return and will perform biopsy. - betamethasone dipropionate (DIPROLENE) 0.05 % cream; Apply topically 2 (two) times daily.  Dispense: 30 g; Refill: 0   Shaana Acocella PA-C  [facility] 12/20/2014 8:46 AM

## 2014-12-20 NOTE — Addendum Note (Signed)
Addended by: Roselee Culver on: 12/20/2014 05:27 PM   Modules accepted: Miquel Dunn

## 2014-12-20 NOTE — Patient Instructions (Signed)
Upper Respiratory Infection, Adult An upper respiratory infection (URI) is also sometimes known as the common cold. The upper respiratory tract includes the nose, sinuses, throat, trachea, and bronchi. Bronchi are the airways leading to the lungs. Most people improve within 1 week, but symptoms can last up to 2 weeks. A residual cough may last even longer.  CAUSES Many different viruses can infect the tissues lining the upper respiratory tract. The tissues become irritated and inflamed and often become very moist. Mucus production is also common. A cold is contagious. You can easily spread the virus to others by oral contact. This includes kissing, sharing a glass, coughing, or sneezing. Touching your mouth or nose and then touching a surface, which is then touched by another person, can also spread the virus. SYMPTOMS  Symptoms typically develop 1 to 3 days after you come in contact with a cold virus. Symptoms vary from person to person. They may include:  Runny nose.  Sneezing.  Nasal congestion.  Sinus irritation.  Sore throat.  Loss of voice (laryngitis).  Cough.  Fatigue.  Muscle aches.  Loss of appetite.  Headache.  Low-grade fever. DIAGNOSIS  You might diagnose your own cold based on familiar symptoms, since most people get a cold 2 to 3 times a year. Your caregiver can confirm this based on your exam. Most importantly, your caregiver can check that your symptoms are not due to another disease such as strep throat, sinusitis, pneumonia, asthma, or epiglottitis. Blood tests, throat tests, and X-rays are not necessary to diagnose a common cold, but they may sometimes be helpful in excluding other more serious diseases. Your caregiver will decide if any further tests are required. RISKS AND COMPLICATIONS  You may be at risk for a more severe case of the common cold if you smoke cigarettes, have chronic heart disease (such as heart failure) or lung disease (such as asthma), or if  you have a weakened immune system. The very young and very old are also at risk for more serious infections. Bacterial sinusitis, middle ear infections, and bacterial pneumonia can complicate the common cold. The common cold can worsen asthma and chronic obstructive pulmonary disease (COPD). Sometimes, these complications can require emergency medical care and may be life-threatening. PREVENTION  The best way to protect against getting a cold is to practice good hygiene. Avoid oral or hand contact with people with cold symptoms. Wash your hands often if contact occurs. There is no clear evidence that vitamin C, vitamin E, echinacea, or exercise reduces the chance of developing a cold. However, it is always recommended to get plenty of rest and practice good nutrition. TREATMENT  Treatment is directed at relieving symptoms. There is no cure. Antibiotics are not effective, because the infection is caused by a virus, not by bacteria. Treatment may include:  Increased fluid intake. Sports drinks offer valuable electrolytes, sugars, and fluids.  Breathing heated mist or steam (vaporizer or shower).  Eating chicken soup or other clear broths, and maintaining good nutrition.  Getting plenty of rest.  Using gargles or lozenges for comfort.  Controlling fevers with ibuprofen or acetaminophen as directed by your caregiver.  Increasing usage of your inhaler if you have asthma. Zinc gel and zinc lozenges, taken in the first 24 hours of the common cold, can shorten the duration and lessen the severity of symptoms. Pain medicines may help with fever, muscle aches, and throat pain. A variety of non-prescription medicines are available to treat congestion and runny nose. Your caregiver   can make recommendations and may suggest nasal or lung inhalers for other symptoms.  HOME CARE INSTRUCTIONS   Only take over-the-counter or prescription medicines for pain, discomfort, or fever as directed by your  caregiver.  Use a warm mist humidifier or inhale steam from a shower to increase air moisture. This may keep secretions moist and make it easier to breathe.  Drink enough water and fluids to keep your urine clear or pale yellow.  Rest as needed.  Return to work when your temperature has returned to normal or as your caregiver advises. You may need to stay home longer to avoid infecting others. You can also use a face mask and careful hand washing to prevent spread of the virus. SEEK MEDICAL CARE IF:   After the first few days, you feel you are getting worse rather than better.  You need your caregiver's advice about medicines to control symptoms.  You develop chills, worsening shortness of breath, or brown or red sputum. These may be signs of pneumonia.  You develop yellow or brown nasal discharge or pain in the face, especially when you bend forward. These may be signs of sinusitis.  You develop a fever, swollen neck glands, pain with swallowing, or white areas in the back of your throat. These may be signs of strep throat. SEEK IMMEDIATE MEDICAL CARE IF:   You have a fever.  You develop severe or persistent headache, ear pain, sinus pain, or chest pain.  You develop wheezing, a prolonged cough, cough up blood, or have a change in your usual mucus (if you have chronic lung disease).  You develop sore muscles or a stiff neck. Document Released: 01/15/2001 Document Revised: 10/14/2011 Document Reviewed: 10/27/2013 ExitCare Patient Information 2015 [employer]. This information is not intended to replace advice given to you by your health care provider. Make sure you discuss any questions you have with your health care provider.  

## 2015-01-11 ENCOUNTER — Ambulatory Visit (INDEPENDENT_AMBULATORY_CARE_PROVIDER_SITE_OTHER): Payer: BLUE CROSS/BLUE SHIELD | Admitting: Urgent Care

## 2015-01-11 VITALS — BP 120/72 | HR 54 | Temp 97.8°F | Resp 18 | Ht 67.5 in | Wt 243.0 lb

## 2015-01-11 DIAGNOSIS — R0981 Nasal congestion: Secondary | ICD-10-CM | POA: Diagnosis not present

## 2015-01-11 DIAGNOSIS — R42 Dizziness and giddiness: Secondary | ICD-10-CM

## 2015-01-11 MED ORDER — PSEUDOEPHEDRINE HCL ER 120 MG PO TB12: 120.0000 mg | ORAL_TABLET | Freq: Two times a day (BID) | ORAL | Status: DC

## 2015-01-11 MED ORDER — MECLIZINE HCL 25 MG PO TABS: 25.0000 mg | ORAL_TABLET | Freq: Three times a day (TID) | ORAL | Status: DC | PRN

## 2015-01-11 NOTE — Progress Notes (Signed)
    MRN: [id number] DOB: 04-07-1966  Subjective:   Jacob House is a 49 y.o. male presenting for chief complaint of Dizziness and Nasal Congestion  Reports 1 day history of intermittent dizziness, vertigo. Patient has tried to hydrate better because he works in [employer], works as a Dietitian. Patient was seen here on 12/20/2014, treated conservatively for URI with Atrovent and Tessalon. He has had improvement in these symptoms except for nasal congestion and sinus pressure. Denies fevers, ear pain, ear drainage, tinnitus, itchy or watery red eyes, tooth pain, sore throat, cough, chest pain, wheezing, shortness of breath, nausea, vomiting, abdominal pain. Denies any other aggravating or relieving factors, no other questions or concerns.  Jacob House takes Human resources officer, Flonase PRN, Miralax PRN. He has No Known Allergies.  Jacob House  has a past medical history of Carpal tunnel syndrome; GERD (gastroesophageal reflux disease); Complication of anesthesia (04-23-2013); Complication of anesthesia; Snoring; OSA on CPAP (12/08/2013); and Allergy. Also  has past surgical history that includes Gallbladder surgery; Anal fissure repair (july 2009 and yrs ago); Cholecystectomy (1999); Eye surgery (Bilateral, 2005); Shoulder surgery (Left, sept 2014); Fracture surgery (Right); Esophagogastroduodenoscopy (egd) with propofol (N/A, 07/22/2013); and Colonoscopy with propofol (N/A, 07/22/2013).  ROS As in subjective.  Objective:   Vitals: BP 120/72 mmHg  Pulse 54  Temp(Src) 97.8 F (36.6 C) (Oral)  Resp 18  Ht 5' 7.5" (1.715 m)  Wt 243 lb (110.224 kg)  BMI 37.48 kg/m2  SpO2 98%  Physical Exam  Constitutional: He appears well-developed and well-nourished.  Body habitus is obese.  HENT:  TM's flat bilaterally L>R, no effusions or erythema. Nasal turbinates pink and moist. No sinus tenderness. Slight postnasal drip present, without oropharyngeal exudates, erythema or abscesses.  Eyes: Conjunctivae are  normal. Right eye exhibits no discharge. Left eye exhibits no discharge. No scleral icterus.  Neck: Normal range of motion. Neck supple.  Cardiovascular: Normal rate, regular rhythm and intact distal pulses.  Exam reveals no gallop and no friction rub.   No murmur heard. Pulmonary/Chest: No stridor. No respiratory distress. He has no wheezes. He has no rales.  Lymphadenopathy:    He has no cervical adenopathy.   Performed Dix-Hallpike maneuver without producing nystagmus, patient also denied dizziness.  Assessment and Plan :   1. Nasal congestion 2. Dizziness 3. Vertigo - Unclear etiology, will manage conservatively for now, consider this as a residual symptom from URI. Given 1 day history, reassuring history and physical exam will start meclizine and Sudafed. - Return to clinic if no improvement in 1-2 weeks.  Jaynee Eagles, PA-C [facility] [phone] 01/11/2015 2:52 PM

## 2015-01-11 NOTE — Patient Instructions (Signed)

## 2015-04-03 ENCOUNTER — Encounter: Payer: Self-pay | Admitting: Cardiovascular Disease

## 2015-11-15 ENCOUNTER — Ambulatory Visit: Payer: Self-pay | Admitting: Neurology

## 2015-11-27 ENCOUNTER — Telehealth: Payer: Self-pay

## 2015-11-27 NOTE — Telephone Encounter (Signed)
LM for patient to bring in SD card from CPAP to appt tomorrow.

## 2015-11-28 ENCOUNTER — Ambulatory Visit (INDEPENDENT_AMBULATORY_CARE_PROVIDER_SITE_OTHER): Payer: BLUE CROSS/BLUE SHIELD | Admitting: Neurology

## 2015-11-28 ENCOUNTER — Encounter: Payer: Self-pay | Admitting: Neurology

## 2015-11-28 VITALS — BP 128/76 | HR 70 | Resp 18 | Ht 67.5 in | Wt 240.0 lb

## 2015-11-28 DIAGNOSIS — G4733 Obstructive sleep apnea (adult) (pediatric): Secondary | ICD-10-CM | POA: Diagnosis not present

## 2015-11-28 DIAGNOSIS — E669 Obesity, unspecified: Secondary | ICD-10-CM

## 2015-11-28 DIAGNOSIS — Z9989 Dependence on other enabling machines and devices: Principal | ICD-10-CM

## 2015-11-28 NOTE — Patient Instructions (Addendum)
Please continue using your CPAP regularly. While your insurance requires that you use CPAP at least 4 hours each night on 70% of the nights, I recommend, that you not skip any nights and use it throughout the night if you can. Getting used to CPAP and staying with the treatment long term does take time and patience and discipline. Untreated obstructive sleep apnea when it is moderate to severe can have an adverse impact on cardiovascular health and raise her risk for heart disease, arrhythmias, hypertension, congestive heart failure, stroke and diabetes. Untreated obstructive sleep apnea causes sleep disruption, nonrestorative sleep, and sleep deprivation. This can have an impact on your day to day functioning and cause daytime sleepiness and impairment of cognitive function, memory loss, mood disturbance, and problems focussing. Using CPAP regularly can improve these symptoms.  Keep up the good work! I will see you back in 12 months for sleep apnea check up.   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    Work on weight loss, drink more water!

## 2015-11-28 NOTE — Progress Notes (Signed)
Subjective:    Patient ID: Jacob House is a 50 y.o. male.  HPI     Interim history:   Jacob House is a very nice 50 year old right-handed gentleman with an underlying medical history of carpal tunnel syndrome, shoulder pain, and obesity, who presents for followup consultation of his severe obstructive sleep apnea, treated with CPAP. He is unaccompanied today. I last saw him on 11/17/2014 at which time he reported full compliance with CPAP. I reviewed his pulse ox report which indicated that he was appropriately treated with CPAP only and did not need supplemental oxygen thankfully. He changed from a nasal mask to nasal pillows interface in February 2016 and like to it. He had some issues with low back pain.   Today, 11/28/2015: I reviewed his CPAP compliance data from 10/29/15 to 11/27/15, which is a total of 30 days, during which time He used his machine 29 days with percent used days greater than 4 hours at 96.7%, indicating excellent compliance with an average usage of 6 hours and 16 minutes, residual AHI 3.4 per hour, pressure at 15 cm, leak low.   Today, 11/28/2015: He reports doing well. He is trying to lose weight but has been fairly stable. Unfortunately, he is going to lose his job in June. 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 He is looking for different job opportunities. 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  Previously:  Addendum 11/17/2014: Later in the afternoon I received his CPAP compliance data from his DME company via fax. This was from 08/24/2014 through 09/22/2014 which is a total of 30 days during which time he used his machine every night with percent used days greater than 4 hours at 100%, indicating superb compliance with an average usage of 6 hours and 35 minutes. Residual AHI acceptable at 3.9 per hour on a pressure of 15 cm and leak low. This indicates superb compliance and of course supports his verbal  report of full compliance.   I saw him on 05/09/2014, at which time he reported compliance with treatment. He had some residual daytime somnolence and some issues with mask fitting. He was using a [place] on nasal mask. He was still not always fully rested in the mornings. He was given a prescription for Voltaren by his orthopedic doctor for his arthritis. He was trying to lose weight and had in fact lost some weight. I asked him to meet with his DME provider for mask refit. I also suggested we proceed with an overnight pulse oximetry test to ensure that his oxygen saturations were fine on treatment.  I reviewed the patient's overnight pulse oximetry report from 09/23/2014 while on CPAP therapy at night. His average oxygen saturation was 95.2%, total test time was 6 hours and 11 minutes. Lowest oxygen saturation was 86%. Time below 88% saturation was 28 seconds. Based on these test results, it appears that the patient is appropriately treated on room air with CPAP only and does not require additional supplemental oxygen at this time. Average pulse rate was 60.2 bpm, maximum was 99 bpm, minimum was 46 bpm during the testing time.   I reviewed his compliance data from 04/10/14 to 05/08/14, which is 30 days, during which time he was 100%, percent used days greater than 4 hours was 90%, indicating excellent compliance. Residual AHI was 3.1 per hour. Leak was at times quite high.   I saw him on 12/08/2013, at which  time I advised him regarding his sleep study results and his compliance. He indicated using his CPAP regularly. There was no data on his SD card. His [employer] CPAP machine indicated that he had been using it every day for the previous 30 days. He reported sleeping well with CPAP and having adjusted fairly well to it. His sleepiness was improved. I asked him to continue using CPAP regularly.  I reviewed the patient's CPAP compliance data from 11/09/2013 to 12/08/2013, which is a total of 30  days, during which time the patient used CPAP every day. The average usage for all days was 6 hours and 39 minutes. The percent used days greater than 4 hours was 100 %, indicating superb compliance. The residual AHI was 5.6 per hour, indicating an adequate treatment pressure of 15 cwp. Air leak from the mask was low.  I first met him on 08/31/2013, at which time he reported snoring and excessive daytime somnolence as well as witnessed apneas. I requested he return for sleep study. He had a split-night sleep study on 10/06/2013: Baseline sleep efficiency was significantly reduced at 45.5% with a long sleep latency of 116.5 minutes and wake after sleep onset of 34 minutes with mild sleep fragmentation noted. He had an elevated arousal index. He had markedly increased percentage of stage II sleep, near absence of slow-wave sleep, and absence of REM sleep prior to CPAP. He had no clinically significant periodic leg movements or EKG changes. He had moderate to loud snoring. His total AHI was 57.8 per hour. Baseline oxygen saturation was 91%, nadir was 67%. He was then titrated on CPAP with sleep efficiency improved at 73.3% with a latency to sleep of 50.5 minutes and wake after sleep onset of 3.5 minutes with mild sleep fragmentation noted. His arousal index was normal. He had a increased percentage of stage II sleep, near absence of slow-wave sleep and an increased percentage of REM sleep at 34.4%. Average oxygen saturation was improved at 93%, nadir was 70% which was during the initial titration period. On the final pressure of 14 cm his O2 nadir was 91% but he did not achieve REM sleep on the final pressure. He was titrated from 4-14 cm with reduction of the AHI of 6.2 events per hour at the final pressure. Based on the test results I did start the patient on CPAP at a pressure of 15 cm as he had a residual AHI of over 5/h on the final pressure of 14 cm during the study.    I reviewed his compliance data from  10/26/2013 through 11/07/2013 which is a total of 13 days during which time he was CPAP every night. Percent used days greater than 4 hours was 100%, average usage was 7 hours and 17 minutes. Residual AHI was 8 per hour on a pressure of 15 cm with no significant leak.   His Past Medical History Is Significant For: Past Medical History  Diagnosis Date  . Carpal tunnel syndrome   . GERD (gastroesophageal reflux disease)   . Complication of anesthesia 04-23-2013    pt told by anesthesia after probably has sleep apnea  . Complication of anesthesia     cannot lie on left side due to recent shoulder surgery per pt  . Snoring   . OSA on CPAP 12/08/2013  . Allergy     His Past Surgical History Is Significant For: Past Surgical History  Procedure Laterality Date  . Gallbladder surgery    . Anal fissure  repair  july 2009 and yrs ago    x 2   . Cholecystectomy  1999  . Eye surgery Bilateral 2005    lasix  . Shoulder surgery Left sept 2014    cartlidge repair  . Fracture surgery Right     foot  . Esophagogastroduodenoscopy (egd) with propofol N/A 07/22/2013    Procedure: ESOPHAGOGASTRODUODENOSCOPY (EGD) WITH PROPOFOL;  Surgeon: Cleotis Nipper, MD;  Location: [facility];  Service: Endoscopy;  Laterality: N/A;  . Colonoscopy with propofol N/A 07/22/2013    Procedure: COLONOSCOPY WITH PROPOFOL;  Surgeon: Cleotis Nipper, MD;  Location: [facility];  Service: Endoscopy;  Laterality: N/A;    His Family History Is Significant For: Family History  Problem Relation Age of Onset  . Cancer Mother 74    colon, stomach  . Cancer Father     testicular  . Cancer Maternal Grandmother     Colon and Breast cancer  . Cancer Maternal Grandfather     Colon cancer  . Cancer Maternal Uncle     colon  . Cancer Maternal Uncle     colon    His Social History Is Significant For: Social History   Social History  . Marital Status: Significant Other    Spouse Name: Juliann Pulse  . Number of Children: 0   . Years of Education: 16   Occupational History  . machine operator    Social History Main Topics  . Smoking status: Never Smoker   . Smokeless tobacco: Never Used  . Alcohol Use: 0.0 - 0.6 oz/week    0-1 Standard drinks or equivalent per week     Comment: rare  . Drug Use: No  . Sexual Activity:    Partners: Female    Patent examiner Protection: Condom     Comment: 1 sexual partner in last year   Other Topics Concern  . None   Social History Narrative   Lives with his significant other, Juliann Pulse, and their cats.    His Allergies Are:  No Known Allergies:   His Current Medications Are:  Outpatient Encounter Prescriptions as of 11/28/2015  Medication Sig  . Polyethylene Glycol 3350 (MIRALAX PO) Take by mouth daily.  . [DISCONTINUED] fexofenadine (ALLEGRA) 180 MG tablet Take 180 mg by mouth daily.  . [DISCONTINUED] fluticasone (FLONASE) 50 MCG/ACT nasal spray Place 2 sprays into both nostrils daily.  . [DISCONTINUED] meclizine (ANTIVERT) 25 MG tablet Take 1 tablet (25 mg total) by mouth 3 (three) times daily as needed for dizziness.  . [DISCONTINUED] pseudoephedrine (SUDAFED 12 HOUR) 120 MG 12 hr tablet Take 1 tablet (120 mg total) by mouth 2 (two) times daily.   No facility-administered encounter medications on file as of 11/28/2015.  :  Review of Systems:  Out of a complete 14 point review of systems, all are reviewed and negative with the exception of these symptoms as listed below:  Review of Systems  Musculoskeletal: Positive for back pain.  Neurological:       Patient states that he is doing well with CPAP. Changed mask to nasal pillows. Only complaint is back pain when sleeping.     Objective:  Neurologic Exam  Physical Exam Physical Examination:   Filed Vitals:   11/28/15 1257  BP: 128/76  Pulse: 70  Resp: 18   General Examination: The patient is a very pleasant 50 y.o. male in no acute distress. He appears well-developed and well-nourished and well  groomed. He is in good spirits today, as is his  usual.  HEENT: Normocephalic, atraumatic, pupils are equal, round and reactive to light and accommodation. Funduscopic exam is normal with sharp disc margins noted. Extraocular tracking is good without limitation to gaze excursion or nystagmus noted. Normal smooth pursuit is noted. Hearing is grossly intact. Face is symmetric with normal facial animation and normal facial sensation. Speech is clear with no dysarthria noted. There is no hypophonia. There is no lip, neck/head, jaw or voice tremor. Neck is supple with full range of passive and active motion. There are no carotid bruits on auscultation. Oropharynx exam reveals: Mild to moderate mouth dryness, adequate dental hygiene and moderate airway crowding, due to narrow airway entry and tonsillar size. Mallampati is class II. Tongue protrudes centrally and palate elevates symmetrically. Tonsils are 2+ in size.   Chest: Clear to auscultation without wheezing, rhonchi or crackles noted.  Heart: S1+S2+0, regular and normal without murmurs, rubs or gallops noted.   Abdomen: Soft, non-tender and non-distended with normal bowel sounds appreciated on auscultation.  Extremities: There is no pitting edema in the distal lower extremities bilaterally. Pedal pulses are intact.  Skin: Warm and dry without trophic changes noted. There are no varicose veins.  Musculoskeletal: exam reveals no obvious joint deformities, tenderness or joint swelling or erythema.   Neurologically:  Mental status: The patient is awake, alert and oriented in all 4 spheres. His memory, attention, language and knowledge are appropriate. There is no aphasia, agnosia, apraxia or anomia. Speech is clear with normal prosody and enunciation. Thought process is linear. Mood is congruent and affect is normal.  Cranial nerves are as described above under HEENT exam. In addition, shoulder shrug is normal with equal shoulder height noted. Motor  exam: Normal bulk, strength and tone is noted. There is no drift, tremor or rebound. Romberg is negative. Reflexes are 2+ throughout. Fine motor skills are intact.  Cerebellar testing shows no dysmetria or intention tremor on finger to nose testing. There is no truncal or gait ataxia.  Sensory exam is intact to light touch in the upper and lower extremities.  Gait, station and balance are unremarkable. No veering to one side is noted. No leaning to one side is noted. Posture is age-appropriate and stance is narrow based. No problems turning are noted. He turns en bloc. Tandem walk is unremarkable.  Assessment and Plan:   In summary, Jayesh Marbach is a very pleasant 50 year old male with an underlying medical history of carpal tunnel syndrome, shoulder pain, arthritis and obesity, who presents for followup consultation of his severe obstructive sleep apnea treated with CPAP of 15 cm. He had a split-night sleep study in March 2015. He has been compliant with CPAP therapy at 15 cm with ongoing good results and excellent compliance. He is going to have to look for a new job. 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 He is trained as a Engineer, technical sales. His physical exam is stable. We talked about his recent compliance data. Last year we also did a pulse oximetry test and it was okay well on CPAP. He did not require supplemental oxygen and had resolution of his severe oxygen desaturations with CPAP treatment at the current level.  He has been using nasal pillows. From my end of things he is doing well. He has benefited from CPAP therapy and has been compliant. His physical exam and neurological exam otherwise nonfocal. He is encouraged to lose weight and drink more water.  We also talked about trying to maintaining a healthy lifestyle in general again today. I would like to see him back routinely in about a year for sleep apnea checkup. If he has any  interim questions or concerns she is encouraged to call or email. I answered all his questions today and he was in agreement.  I spent 25 minutes in total face-to-face time with the patient, more than 50% of which was spent in counseling and coordination of care, reviewing test results, reviewing medication and discussing or reviewing the diagnosis of OSA, the prognosis and treatment options.

## 2015-12-04 ENCOUNTER — Telehealth: Payer: Self-pay

## 2015-12-04 NOTE — Telephone Encounter (Signed)
Pt states he is in need of his records for the last 3 years. Please call [phone] when ready and pt was told it could take up to 72 hrs

## 2015-12-25 ENCOUNTER — Telehealth: Payer: Self-pay | Admitting: *Deleted

## 2015-12-25 NOTE — Telephone Encounter (Signed)
Message For: O/C                  Taken 22-MAY-17 at  1:59PM by [facility] ------------------------------------------------------------ Jacob House             CID [id number]  Patient SAME                 Pt's Dr [name]        Area Code [id number] * DOB [phone]    RE NEEDS A COPY OF HIS SLEEP STUDY RESULTS AND A     LETTER FROM THE DOCTOR FOR THE FAA & DOWNLOAD/CHIP   Disp:Y/N Y If Y = C/B If No Response In 32minutes ============================================================

## 2015-12-26 ENCOUNTER — Telehealth: Payer: Self-pay | Admitting: Neurology

## 2015-12-26 NOTE — Telephone Encounter (Signed)
Jacob House, I don't give copies of results to the patient.  I think the nurse or medical records would do this. Thanks

## 2016-01-04 ENCOUNTER — Telehealth: Payer: Self-pay | Admitting: Neurology

## 2016-01-04 NOTE — Telephone Encounter (Signed)
Patient called and states he called days ago to get a copy of his sleep study and hasn't heard anything from anyone.  I don't know who sends out medical records when they are requested.  Can you please help me with this patient.

## 2016-01-11 ENCOUNTER — Telehealth: Payer: Self-pay | Admitting: Neurology

## 2016-01-11 NOTE — Telephone Encounter (Signed)
Completed on 01/11/16.

## 2016-01-11 NOTE — Telephone Encounter (Signed)
Called pt and lm on vm that rcords are ready to be picked up at the front desk/Bws

## 2016-01-16 ENCOUNTER — Telehealth: Payer: Self-pay

## 2016-01-16 NOTE — Telephone Encounter (Signed)
I spoke with patient and let him know that the letter he requested is at the front desk ready for pick up.

## 2016-01-26 ENCOUNTER — Telehealth: Payer: Self-pay | Admitting: Neurology

## 2016-01-26 NOTE — Telephone Encounter (Addendum)
Patient called requests "1 year's worth of downloads from CPAP", requests this to be faxed Dr. Harden Mo Fax [phone].

## 2016-01-26 NOTE — Telephone Encounter (Signed)
Beverlee Nims: Can you do the download of the past year? Pls see phone message and fax, may have to do in 3 month increments?

## 2016-01-29 NOTE — Telephone Encounter (Signed)
I have faxed the records that we have access too. Patient's machine does not upload on-line. Any further information will have to be requested from DME company.

## 2016-02-01 NOTE — Telephone Encounter (Addendum)
Pt called said the DME company is not in business in [place] anymore, they have moved to [place]. He does not know the name. Please call to discuss. [phone]

## 2016-02-01 NOTE — Telephone Encounter (Signed)
I spoke to patient and confirmed that he is speaking about his old DME company. His doctor needs a whole year of CPAP download.

## 2016-02-01 NOTE — Telephone Encounter (Signed)
Dina came by, I was able to get whole year of download for patient and faxed it to his doctor per patient request. Patient gave me fax number

## 2016-02-23 ENCOUNTER — Telehealth: Payer: Self-pay

## 2016-02-23 NOTE — Telephone Encounter (Signed)
I spoke with pt, I advised him that he has not been seen here in over a year. I will have to check to see what he actually needs.

## 2016-02-23 NOTE — Telephone Encounter (Signed)
Patient dropped off papers for doctors clearance - medication list   Papers in nurses box   Please call upon completion [phone]

## 2016-02-27 ENCOUNTER — Ambulatory Visit (INDEPENDENT_AMBULATORY_CARE_PROVIDER_SITE_OTHER): Payer: BLUE CROSS/BLUE SHIELD | Admitting: Emergency Medicine

## 2016-02-27 VITALS — BP 120/80 | HR 69 | Temp 98.4°F | Resp 18

## 2016-02-27 DIAGNOSIS — Z0283 Encounter for blood-alcohol and blood-drug test: Secondary | ICD-10-CM

## 2016-02-27 DIAGNOSIS — Z0189 Encounter for other specified special examinations: Secondary | ICD-10-CM | POA: Diagnosis not present

## 2016-02-27 NOTE — Progress Notes (Signed)
By signing my name below, I, [name], attest that this documentation has been prepared under the direction and in the presence of [name], MD.  Electronically Signed: Verlee House, Medical Scribe. 02/27/16. 11:57 AM.  Chief Complaint:  Chief Complaint  Patient presents with  . need a letter    for some meds he used to take,and he does not take them anymore    HPI: Jacob House is a 50 y.o. 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 Pt was working with [employer]. in [place] For 20 years before he lost his job June 27th 2017. 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 Pt had to get a special issuance for sleep apnea. Pt had shoulder surgery and took Tramadol PRN, and Hydrocodone PRN for relief to his symptoms. Pt states he was also prescribed hydrocodone cough syrup here at [facility]. Pt stopped taking Chlorpheniramine and now takes non drowsy Claritin. Pt states the Chlorpheniramine was prescribed by Dr. Lorel Monaco.  Past Medical History:  Diagnosis Date  . Allergy   . Carpal tunnel syndrome   . Complication of anesthesia 04-23-2013   pt told by anesthesia after probably has sleep apnea  . Complication of anesthesia    cannot lie on left side due to recent shoulder surgery per pt  . GERD (gastroesophageal reflux disease)   . OSA on CPAP 12/08/2013  . Snoring    Past Surgical History:  Procedure Laterality Date  . ANAL FISSURE REPAIR  july 2009 and yrs ago   x 2   . CHOLECYSTECTOMY  1999  . COLONOSCOPY WITH PROPOFOL N/A 07/22/2013   Procedure: COLONOSCOPY WITH PROPOFOL;  Surgeon: Cleotis Nipper, MD;  Location: [facility];  Service: Endoscopy;  Laterality: N/A;  . ESOPHAGOGASTRODUODENOSCOPY (EGD) WITH PROPOFOL N/A 07/22/2013   Procedure: ESOPHAGOGASTRODUODENOSCOPY (EGD) WITH PROPOFOL;  Surgeon: Cleotis Nipper, MD;  Location: [facility];  Service:  Endoscopy;  Laterality: N/A;  . EYE SURGERY Bilateral 2005   lasix  . FRACTURE SURGERY Right    foot  . GALLBLADDER SURGERY    . SHOULDER SURGERY Left sept 2014   cartlidge repair   Social History   Social History  . Marital status: Significant Other    Spouse name: Jacob House  . Number of children: 0  . Years of education: 33   Occupational History  . machine operator [place] History Main Topics  . Smoking status: Never Smoker  . Smokeless tobacco: Never Used  . Alcohol use 0.0 - 0.6 oz/week     Comment: rare  . Drug use: No  . Sexual activity: Yes    Partners: Female    Birth control/ protection: Condom     Comment: 1 sexual partner in last year   Other Topics Concern  . None   Social History Narrative   Lives with his significant other, Jacob House, and their cats.   Family History  Problem Relation Age of Onset  . Cancer Mother 13    colon, stomach  . Cancer Father     testicular  . Cancer Maternal Grandmother     Colon and Breast cancer  . Cancer Maternal Grandfather     Colon cancer  . Cancer Maternal Uncle     colon  . Cancer Maternal Uncle     colon   No Known  Allergies Prior to Admission medications   Medication Sig Start Date End Date Taking? Authorizing Provider  Polyethylene Glycol 3350 (MIRALAX PO) Take by mouth daily.   Yes Historical Provider, MD     ROS: The patient denies fevers, chills, night sweats, unintentional weight loss, chest pain, palpitations, wheezing, dyspnea on exertion, nausea, vomiting, abdominal pain, dysuria, hematuria, melena, numbness, weakness, or tingling.   All other systems have been reviewed and were otherwise negative with the exception of those mentioned in the HPI and as above.    PHYSICAL EXAM: Vitals:   02/27/16 1137  BP: (!) 160/80  House: 69  Resp: 18  Temp: 98.4 F (36.9 C)   There is no height or weight on file to calculate BMI.   General: Alert, no acute distress HEENT:   Normocephalic, atraumatic, oropharynx patent. Eye: [name] [facility] Cardiovascular:  Regular rate and rhythm, no rubs murmurs or gallops.  No Carotid bruits, radial House intact. No pedal edema.  Respiratory: Clear to auscultation bilaterally.  No wheezes, rales, or rhonchi.  No cyanosis, no use of accessory musculature Abdominal: No organomegaly, abdomen is soft and non-tender, positive bowel sounds.  No masses. Musculoskeletal: Gait intact. No edema, tenderness Skin: No rashes. Neurologic: Facial musculature symmetric. Psychiatric: Patient acts appropriately throughout our interaction. Lymphatic: No cervical or submandibular lymphadenopathy    LABS:    EKG/XRAY:   Primary read interpreted by Dr. Everlene Farrier at [facility].   ASSESSMENT/PLAN: Narcotics Registry: Nothing found in the past 6 months. Patient was seen here and prescribed cough medication with hydrocodone. I performed a profile 7 drug screen. There were no positives on the national Registry. I will write a letter to the [place] once these results are in stating that patient's drug screen results and findings on the Registry.. Repeat blood pressure 120/80 I personally performed the services described in this documentation, which was scribed in my presence. The recorded information has been reviewed and is accurate.   Gross sideeffects, risk and benefits, and alternatives of medications d/w patient. Patient is aware that all medications have potential sideeffects and we are unable to predict every sideeffect or drug-drug interaction that may occur.  [name] MD 02/27/2016 11:56 AM

## 2016-02-27 NOTE — Patient Instructions (Signed)
     IF you received an x-ray today, you will receive an invoice from [facility]. Please contact [facility] at [phone] with questions or concerns regarding your invoice.   IF you received labwork today, you will receive an invoice from [facility]. Please contact [facility] at [phone] with questions or concerns regarding your invoice.   Our billing staff will not be able to assist you with questions regarding bills from these companies.  You will be contacted with the lab results as soon as they are available. The fastest way to get your results is to activate your My Chart account. Instructions are located on the last page of this paperwork. If you have not heard from us regarding the results in 2 weeks, please contact this office.      

## 2016-02-27 NOTE — Telephone Encounter (Signed)
This patient needs an office visit for his disability forms. I saw him for a URI more than 1 year ago. He was seen here by Dr. Lorelei Pont, Dr. Ouida Sills, [place]. The forms he is dropping off are inappropriate for me to complete without talking with him as I have no idea what it is about and why he needs them. In fact, if he would like to have them completed he can see any provider here as they are just as capable as I am of completing them. I will place forms back in the nurse's box.

## 2016-02-27 NOTE — Telephone Encounter (Signed)
Informed pt he has to be seen to get the form filled out. He is very upset and doesn't want to pay his copay just for this. I advised him of Mani's message and advised that he could not be seen if he did not pay his co-pay. I transferred him to appointments. I still have the form at my desk and I will place it back in the nurse's box so the provider can have when he comes in.

## 2016-03-01 LAB — PAIN MGMT, PROFILE 7 CONF W/O MM, U
6 ACETYLMORPHINE: NEGATIVE ng/mL (ref <10)
Alcohol Metabolites: NEGATIVE ng/mL (ref <500)
Amphetamines: NEGATIVE ng/mL (ref <500)
BARBITURATES: NEGATIVE ng/mL (ref <300)
Benzodiazepines: NEGATIVE ng/mL (ref <100)
Cocaine Metabolite: NEGATIVE ng/mL (ref <150)
Creatinine: 93.1 mg/dL (ref >=20.0)
METHADONE METABOLITE: NEGATIVE ng/mL (ref <100)
Marijuana Metabolite: NEGATIVE ng/mL (ref <20)
OXYCODONE: NEGATIVE ng/mL (ref <100)
Opiates: NEGATIVE ng/mL (ref <100)
Oxidant: NEGATIVE ug/mL (ref <200)
PH: 5.88 (ref 4.5–9.0)
Please note:: 0

## 2016-03-04 ENCOUNTER — Telehealth: Payer: Self-pay

## 2016-03-04 NOTE — Telephone Encounter (Signed)
PATIENT STATES HE SAW DR. DAUB A WEEK AGO AND HAD TO HAVE A URINALYSIS TO SEE IF HE HAD NARCOTICS IN HIS SYSTEM. HE HAS NOT HEARD ANYTHING BACK YET. HE NEEDS DR. DAUB TO WRITE A LETTER TO THE FAA, BUT DR. DAUB WANTED TO GET THE RESULTS BACK FIRST. BEST PHONE [phone] (CELL)  [place]

## 2016-03-06 ENCOUNTER — Encounter: Payer: Self-pay | Admitting: *Deleted

## 2016-03-06 NOTE — Telephone Encounter (Signed)
Please dictate a letter that states that the patient was seen and evaluated for history of taking pain medications for an acute injury. Drug screen was done and confirmed the patient is not currently on any type of narcotic pain medication. He has not been on narcotic pain medication for  At least a year.

## 2016-03-12 NOTE — Telephone Encounter (Signed)
This letter has already been written.

## 2016-04-17 ENCOUNTER — Inpatient Hospital Stay
Admission: RE | Admit: 2016-04-17 | Discharge: 2016-04-17 | Disposition: A | Discharge status: Home / Self Care | Payer: Self-pay | Source: Ambulatory Visit | Attending: Endocrinology | Admitting: Endocrinology

## 2016-04-17 ENCOUNTER — Other Ambulatory Visit: Payer: Self-pay | Admitting: Endocrinology

## 2016-04-17 DIAGNOSIS — R599 Enlarged lymph nodes, unspecified: Secondary | ICD-10-CM

## 2016-06-24 ENCOUNTER — Telehealth: Payer: Self-pay

## 2016-06-24 ENCOUNTER — Ambulatory Visit (INDEPENDENT_AMBULATORY_CARE_PROVIDER_SITE_OTHER): Payer: BLUE CROSS/BLUE SHIELD | Admitting: Physician Assistant

## 2016-06-24 VITALS — BP 128/70 | HR 80 | Temp 98.1°F | Resp 20 | Ht 67.5 in | Wt 246.8 lb

## 2016-06-24 DIAGNOSIS — J069 Acute upper respiratory infection, unspecified: Secondary | ICD-10-CM

## 2016-06-24 DIAGNOSIS — R110 Nausea: Secondary | ICD-10-CM | POA: Diagnosis not present

## 2016-06-24 MED ORDER — ONDANSETRON HCL 4 MG PO TABS: 4.0000 mg | ORAL_TABLET | Freq: Three times a day (TID) | ORAL | 0 refills | Status: DC | PRN

## 2016-06-24 MED ORDER — AMOXICILLIN-POT CLAVULANATE 875-125 MG PO TABS: 1.0000 | ORAL_TABLET | Freq: Two times a day (BID) | ORAL | 0 refills | Status: DC

## 2016-06-24 NOTE — Patient Instructions (Signed)
     IF you received an x-ray today, you will receive an invoice from [facility]. Please contact [facility] at [phone] with questions or concerns regarding your invoice.   IF you received labwork today, you will receive an invoice from [facility]. Please contact [facility] at [phone] with questions or concerns regarding your invoice.   Our billing staff will not be able to assist you with questions regarding bills from these companies.  You will be contacted with the lab results as soon as they are available. The fastest way to get your results is to activate your My Chart account. Instructions are located on the last page of this paperwork. If you have not heard from us regarding the results in 2 weeks, please contact this office.      

## 2016-06-24 NOTE — Progress Notes (Signed)
06/24/2016 4:17 PM   DOB: Apr 01, 1966 / MRN: [id number]  SUBJECTIVE:  Jacob House is a 50 y.o. male presenting for cough and congestion that started 12 days ago. Reports that he was feeling somewhat better about 8 days ago but then felt ill again. He associates fever that start 5 days ago and has since resolved along with some nausea that has persisted.  He is missing work at this time. He is never smoker. He denies a history of ashtma. Denies medication allergies.   He has No Known Allergies.   He  has a past medical history of Allergy; Carpal tunnel syndrome; Complication of anesthesia (04-23-2013); Complication of anesthesia; GERD (gastroesophageal reflux disease); OSA on CPAP (12/08/2013); and Snoring.    He  reports that he has never smoked. He has never used smokeless tobacco. He reports that he drinks alcohol. He reports that he does not use drugs. He  reports that he currently engages in sexual activity and has had male partners. He reports using the following method of birth control/protection: Condom. The patient  has a past surgical history that includes Gallbladder surgery; Anal fissure repair (july 2009 and yrs ago); Cholecystectomy (1999); Eye surgery (Bilateral, 2005); Shoulder surgery (Left, sept 2014); Fracture surgery (Right); Esophagogastroduodenoscopy (egd) with propofol (N/A, 07/22/2013); and Colonoscopy with propofol (N/A, 07/22/2013).  His family history includes Cancer in his father, maternal grandfather, maternal grandmother, maternal uncle, and maternal uncle; Cancer (age of onset: 3) in his mother.  Review of Systems  Constitutional: Negative for chills and fever.  Respiratory: Negative for cough.   Cardiovascular: Negative for chest pain.  Gastrointestinal: Negative for nausea.  Skin: Negative for itching and rash.  Neurological: Negative for dizziness.    The problem list and medications were reviewed and updated by myself where necessary and exist elsewhere in  the encounter.   OBJECTIVE:  BP 128/70 (BP Location: Right Arm, Patient Position: Sitting, Cuff Size: Large)   Pulse 80   Temp 98.1 F (36.7 C) (Oral)   Resp 20   Ht 5' 7.5" (1.715 m)   Wt 246 lb 12.8 oz (111.9 kg)   SpO2 96%   BMI 38.08 kg/m   Physical Exam  Constitutional: He is oriented to person, place, and time.  HENT:  Right Ear: Tympanic membrane normal.  Left Ear: Tympanic membrane normal.  Nose: Nose normal.  Mouth/Throat: Uvula is midline, oropharynx is clear and moist and mucous membranes are normal.  Cardiovascular: Normal rate and regular rhythm.   Pulmonary/Chest: Effort normal and breath sounds normal.  Abdominal: Soft. Bowel sounds are normal. He exhibits no distension and no mass. There is no tenderness. There is no rebound and no guarding.  Musculoskeletal: Normal range of motion.  Neurological: He is alert and oriented to person, place, and time.  Skin: Skin is warm and dry.  Psychiatric: He has a normal mood and affect.    No results found for this or any previous visit (from the past 72 hour(s)).  No results found.  ASSESSMENT AND PLAN  Diron was seen today for cough.  Diagnoses and all orders for this visit:  Acute URI Comments: Likely bacterial given long duration and double sickening.  Exam normal.  Will treat.  Advised risk of C. Diff and explained possibility of extended treatment using augmentin if he worsens once stopping the abx.  Orders: -     amoxicillin-clavulanate (AUGMENTIN) 875-125 MG tablet; Take 1 tablet by mouth 2 (two) times daily.  Nausea without vomiting -  ondansetron (ZOFRAN) 4 MG tablet; Take 1 tablet (4 mg total) by mouth every 8 (eight) hours as needed for nausea or vomiting.    The patient is advised to call or return to clinic if he does not see an improvement in symptoms, or to seek the care of the closest emergency department if he worsens with the above plan.   Philis Fendt, MHS, PA-C [facility] 06/24/2016 4:17 PM

## 2016-06-24 NOTE — Telephone Encounter (Signed)
Patient was seen today by [name].   He forgot to request a non-narcotic cough medication.     [facility] [address]

## 2016-06-25 NOTE — Telephone Encounter (Signed)
Pt came into the office to check on this and front desk asked me about it. I advised that Jacob House is not in to check his messages today, but that most of the providers recommend Delsyn OTC for non-narcotic cough med. I advised them to have pt check with his pharm late if he hasn't heard back from [place] in case something else is sent in.

## 2016-06-26 NOTE — Telephone Encounter (Signed)
Agree with Delsyn OTC. Philis Fendt, MS, PA-C 1:22 PM, 06/26/2016

## 2016-07-01 NOTE — Telephone Encounter (Signed)
Spoke with pt, he has been taking delsyn and feels much better!

## 2016-08-26 IMAGING — CR DG LUMBAR SPINE COMPLETE 4+V
5 series · 5 of 5 positions shown · non-contrast
Comparison: Lumbar spine series of June 04, 2008

CLINICAL DATA: Low back pain without mention of injury.

EXAM:
LUMBAR SPINE - COMPLETE 4+ VIEW

[AP (1 of 2)]
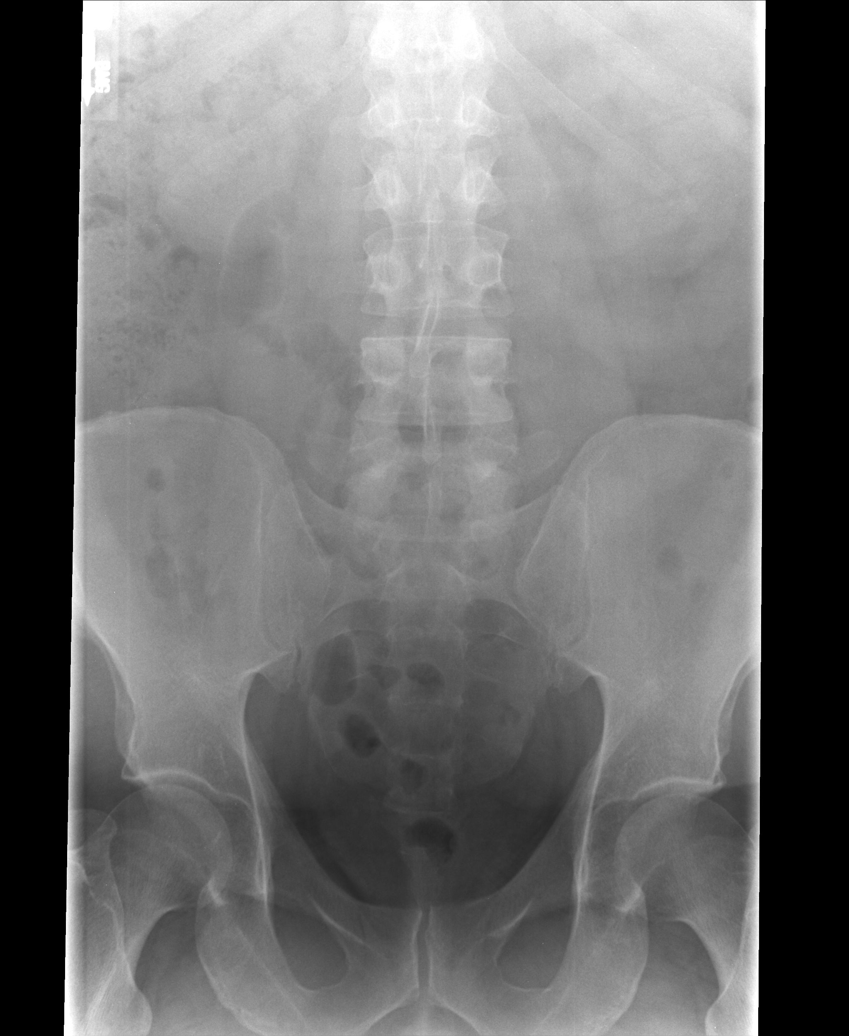

[AP (2 of 2)]
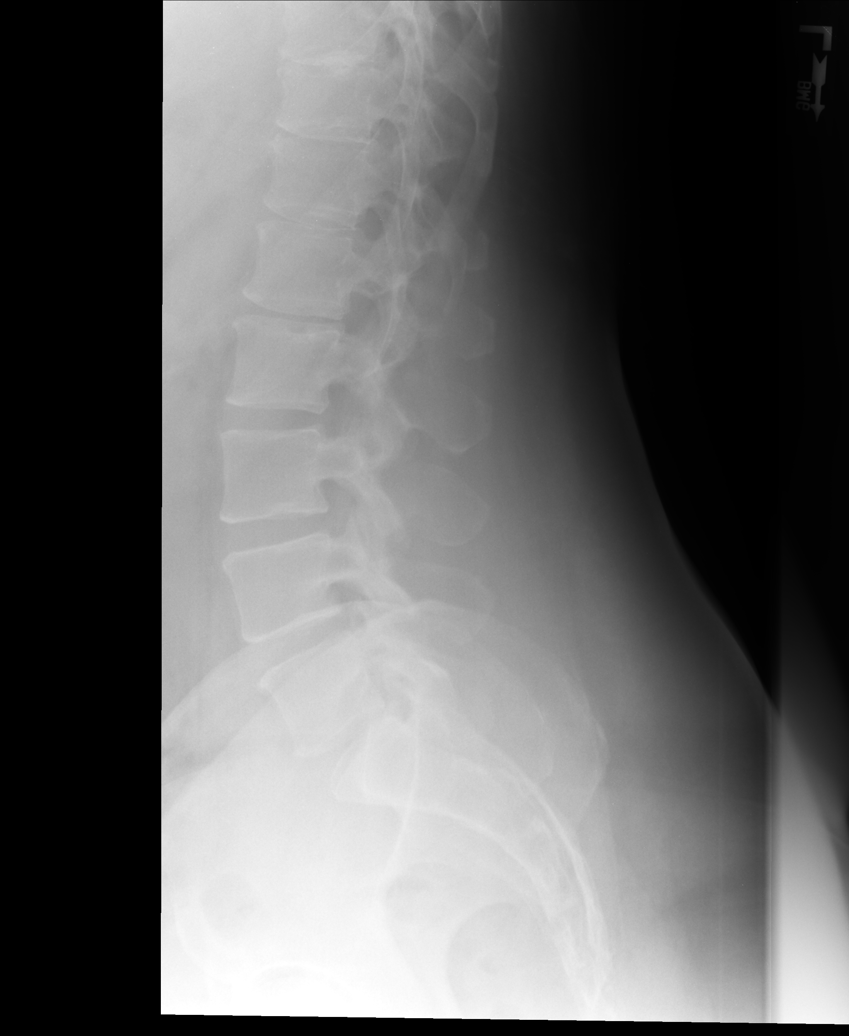

[l5 s1]
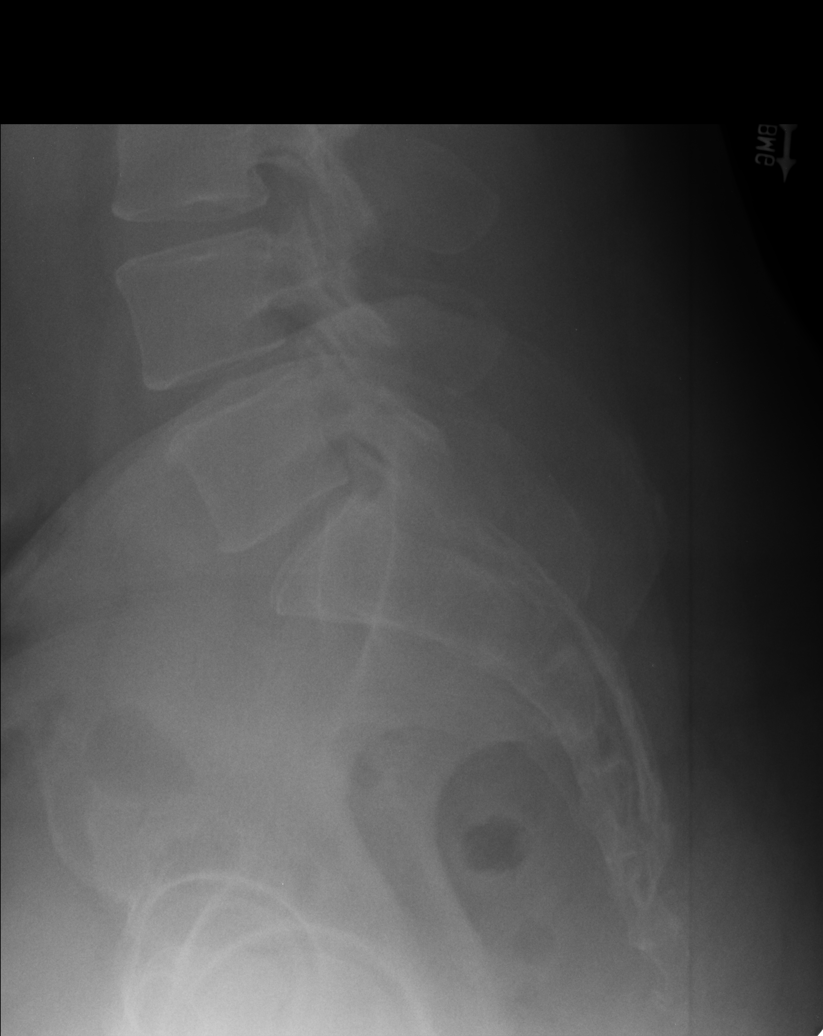

[rpo]
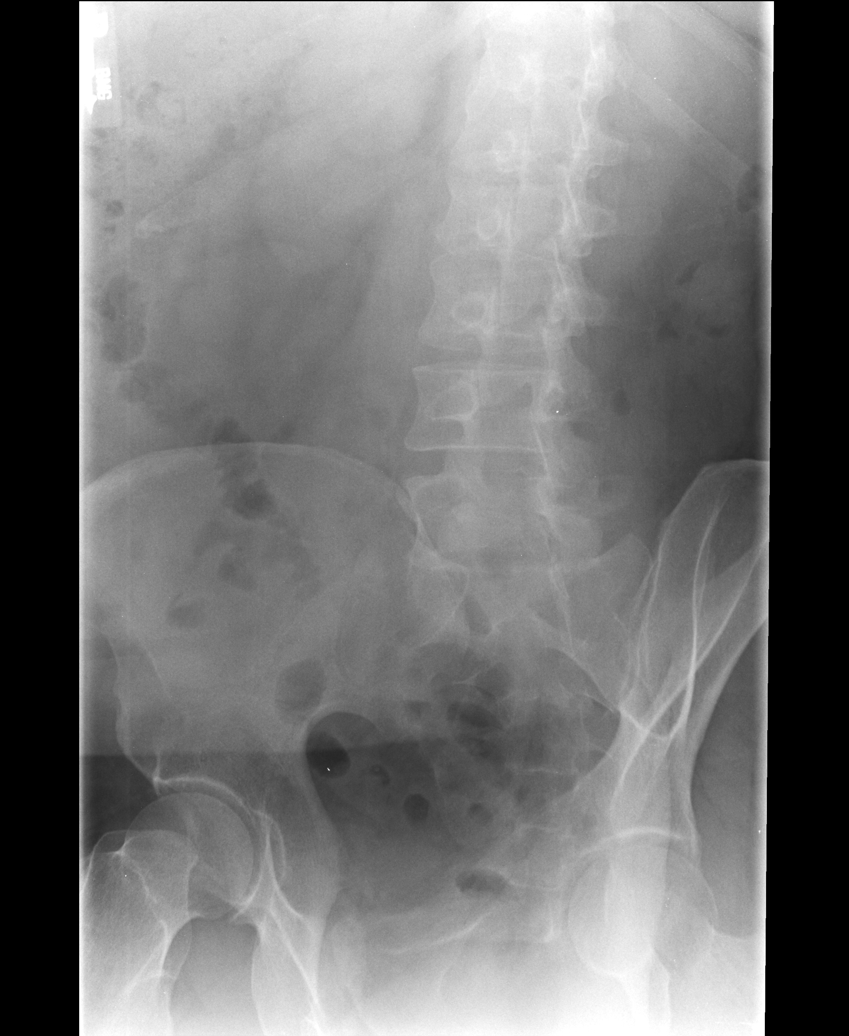

[lpo]
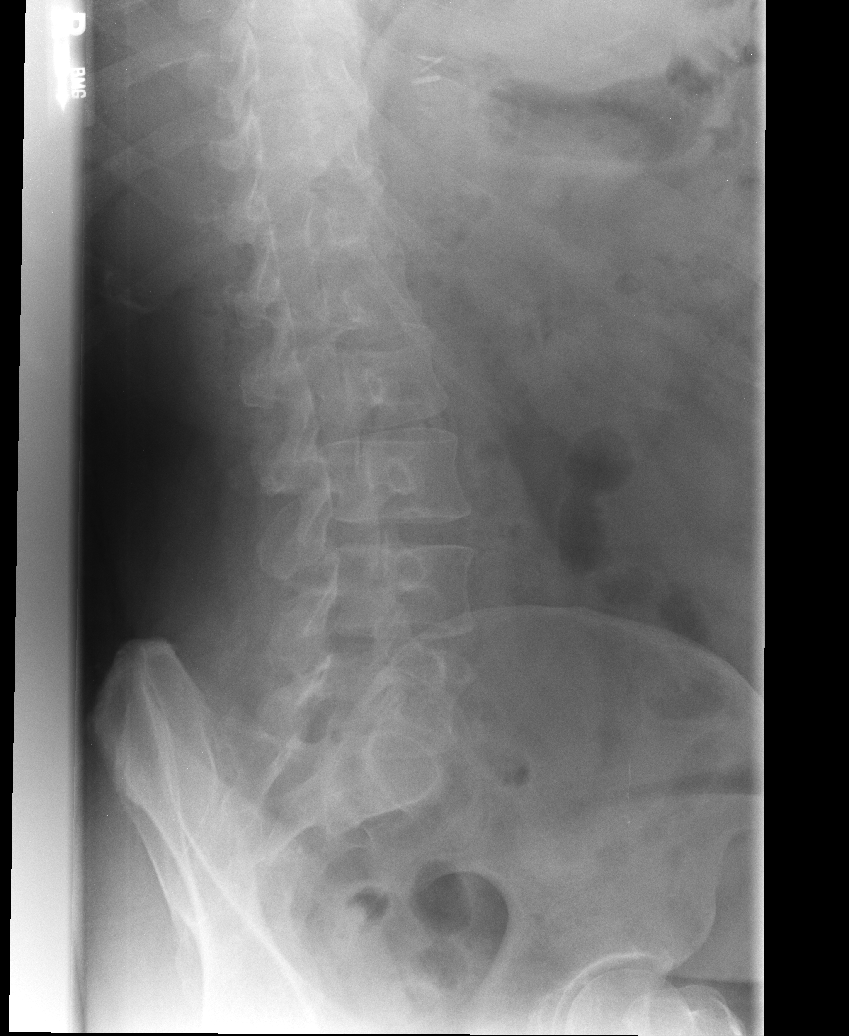

[5 of 5 positions shown; findings below may reference images not displayed]

FINDINGS: The lumbar vertebral bodies are preserved in height. There is no
significant disc space narrowing. There is no spondylolisthesis. The
facet joints are unremarkable. The pedicles are intact.
IMPRESSION: There is no acute or significant chronic bony abnormality of the
lumbar spine.

## 2016-09-30 DIAGNOSIS — G4733 Obstructive sleep apnea (adult) (pediatric): Secondary | ICD-10-CM | POA: Diagnosis not present

## 2016-11-27 ENCOUNTER — Ambulatory Visit: Payer: BLUE CROSS/BLUE SHIELD | Admitting: Neurology

## 2017-01-21 ENCOUNTER — Ambulatory Visit (INDEPENDENT_AMBULATORY_CARE_PROVIDER_SITE_OTHER): Payer: Managed Care, Other (non HMO) | Admitting: Neurology

## 2017-01-21 ENCOUNTER — Encounter: Payer: Self-pay | Admitting: Neurology

## 2017-01-21 VITALS — BP 120/70 | HR 69 | Temp 97.6°F | Ht 67.0 in | Wt 230.0 lb

## 2017-01-21 DIAGNOSIS — G4733 Obstructive sleep apnea (adult) (pediatric): Secondary | ICD-10-CM

## 2017-01-21 DIAGNOSIS — Z9989 Dependence on other enabling machines and devices: Secondary | ICD-10-CM | POA: Diagnosis not present

## 2017-01-21 NOTE — Patient Instructions (Signed)
Please continue using your CPAP regularly. While your insurance requires that you use CPAP at least 4 hours each night on 70% of the nights, I recommend, that you not skip any nights and use it throughout the night if you can. Getting used to CPAP and staying with the treatment long term does take time and patience and discipline. Untreated obstructive sleep apnea when it is moderate to severe can have an adverse impact on cardiovascular health and raise her risk for heart disease, arrhythmias, hypertension, congestive heart failure, stroke and diabetes. Untreated obstructive sleep apnea causes sleep disruption, nonrestorative sleep, and sleep deprivation. This can have an impact on your day to day functioning and cause daytime sleepiness and impairment of cognitive function, memory loss, mood disturbance, and problems focussing. Using CPAP regularly can improve these symptoms.  Keep up the good work! I will see you back in one 6 months for sleep apnea check up, and if you continue to do well on CPAP I will see you once a year thereafter.

## 2017-01-21 NOTE — Progress Notes (Signed)
Subjective:    Patient ID: Jacob House is a 51 y.o. male.  HPI     Interim history:  Jacob House is a 51 year old right-handed gentleman with an underlying medical history of carpal tunnel syndrome, shoulder pain, and obesity, who presents for followup consultation of his severe obstructive sleep apnea, treated with CPAP. He is unaccompanied today. I last saw him on 11/28/2015, at which time he was doing well, he was fully compliant with his CPAP. I received his compliance data after the visit via fax from his DME company and he was under percent compliant at the time, he was trying to lose weight. 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  Today, 01/21/2017 (all dictated new, as well as above notes, some dictation done in note pad or Word, outside of chart, may appear as copied):  I reviewed his CPAP compliance data from 12/22/2016 through 01/20/2017 which is a total of 30 days, during which time he used his machine every night with percent used days greater than 4 hours at 100%, average usage of 6 hours and 48 minutes, residual AHI 2.4 per hour, leak low generally speaking, pressure of 15 cm. He reports doing well. Over the past one year he has been fully compliant with his CPAP. He is looking into getting a secondary CPAP machine for travel purposes. 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 He is currently working at [employer]. He is trying to lose weight and has started a weight loss program through his gym, has lost about 12 pounds so far.   The patient's allergies, current medications, family history, past medical history, past social history, past surgical history and problem list were reviewed and updated as appropriate.   Previously (copied from previous notes for reference):   I saw him on 11/17/2014 at which time he reported full compliance with CPAP. I reviewed his pulse ox report which indicated that he was  appropriately treated with CPAP only and did not need supplemental oxygen thankfully. He changed from a nasal mask to nasal pillows interface in February 2016 and like to it. He had some issues with low back pain.    I reviewed his CPAP compliance data from 10/29/15 to 11/27/15, which is a total of 30 days, during which time He used his machine 29 days with percent used days greater than 4 hours at 96.7%, indicating excellent compliance with an average usage of 6 hours and 16 minutes, residual AHI 3.4 per hour, pressure at 15 cm, leak low.     Addendum 11/17/2014: Later in the afternoon I received his CPAP compliance data from his DME company via fax. This was from 08/24/2014 through 09/22/2014 which is a total of 30 days during which time he used his machine every night with percent used days greater than 4 hours at 100%, indicating superb compliance with an average usage of 6 hours and 35 minutes. Residual AHI acceptable at 3.9 per hour on a pressure of 15 cm and leak low. This indicates superb compliance and of course supports his verbal report of full compliance.    I saw him on 05/09/2014, at which time he reported compliance with treatment. He had some residual daytime somnolence and some issues with mask fitting. He was using a [place] on nasal mask. He was still not always fully rested in the mornings. He was given a prescription for Voltaren by his orthopedic doctor for his arthritis. He  was trying to lose weight and had in fact lost some weight. I asked him to meet with his DME provider for mask refit. I also suggested we proceed with an overnight pulse oximetry test to ensure that his oxygen saturations were fine on treatment.   I reviewed the patient's overnight pulse oximetry report from 09/23/2014 while on CPAP therapy at night. His average oxygen saturation was 95.2%, total test time was 6 hours and 11 minutes. Lowest oxygen saturation was 86%. Time below 88% saturation was 28 seconds. Based on  these test results, it appears that the patient is appropriately treated on room air with CPAP only and does not require additional supplemental oxygen at this time. Average pulse rate was 60.2 bpm, maximum was 99 bpm, minimum was 46 bpm during the testing time.    I reviewed his compliance data from 04/10/14 to 05/08/14, which is 30 days, during which time he was 100%, percent used days greater than 4 hours was 90%, indicating excellent compliance. Residual AHI was 3.1 per hour. Leak was at times quite high.    I saw him on 12/08/2013, at which time I advised him regarding his sleep study results and his compliance. He indicated using his CPAP regularly. There was no data on his SD card. His [employer] CPAP machine indicated that he had been using it every day for the previous 30 days. He reported sleeping well with CPAP and having adjusted fairly well to it. His sleepiness was improved. I asked him to continue using CPAP regularly.   I reviewed the patient's CPAP compliance data from 11/09/2013 to 12/08/2013, which is a total of 30 days, during which time the patient used CPAP every day. The average usage for all days was 6 hours and 39 minutes. The percent used days greater than 4 hours was 100 %, indicating superb compliance. The residual AHI was 5.6 per hour, indicating an adequate treatment pressure of 15 cwp. Air leak from the mask was low.   I first met him on 08/31/2013, at which time he reported snoring and excessive daytime somnolence as well as witnessed apneas. I requested he return for sleep study. He had a split-night sleep study on 10/06/2013: Baseline sleep efficiency was significantly reduced at 45.5% with a long sleep latency of 116.5 minutes and wake after sleep onset of 34 minutes with mild sleep fragmentation noted. He had an elevated arousal index. He had markedly increased percentage of stage II sleep, near absence of slow-wave sleep, and absence of REM sleep prior to CPAP. He  had no clinically significant periodic leg movements or EKG changes. He had moderate to loud snoring. His total AHI was 57.8 per hour. Baseline oxygen saturation was 91%, nadir was 67%. He was then titrated on CPAP with sleep efficiency improved at 73.3% with a latency to sleep of 50.5 minutes and wake after sleep onset of 3.5 minutes with mild sleep fragmentation noted. His arousal index was normal. He had a increased percentage of stage II sleep, near absence of slow-wave sleep and an increased percentage of REM sleep at 34.4%. Average oxygen saturation was improved at 93%, nadir was 70% which was during the initial titration period. On the final pressure of 14 cm his O2 nadir was 91% but he did not achieve REM sleep on the final pressure. He was titrated from 4-14 cm with reduction of the AHI of 6.2 events per hour at the final pressure. Based on the test results I did start the patient  on CPAP at a pressure of 15 cm as he had a residual AHI of over 5/h on the final pressure of 14 cm during the study.     I reviewed his compliance data from 10/26/2013 through 11/07/2013 which is a total of 13 days during which time he was CPAP every night. Percent used days greater than 4 hours was 100%, average usage was 7 hours and 17 minutes. Residual AHI was 8 per hour on a pressure of 15 cm with no significant leak.   His Past Medical History Is Significant For: Past Medical History:  Diagnosis Date  . Allergy   . Carpal tunnel syndrome   . Complication of anesthesia 04-23-2013   pt told by anesthesia after probably has sleep apnea  . Complication of anesthesia    cannot lie on left side due to recent shoulder surgery per pt  . GERD (gastroesophageal reflux disease)   . OSA on CPAP 12/08/2013  . Snoring     His Past Surgical History Is Significant For: Past Surgical History:  Procedure Laterality Date  . ANAL FISSURE REPAIR  july 2009 and yrs ago   x 2   . CHOLECYSTECTOMY  1999  . COLONOSCOPY WITH  PROPOFOL N/A 07/22/2013   Procedure: COLONOSCOPY WITH PROPOFOL;  Surgeon: Cleotis Nipper, MD;  Location: [facility];  Service: Endoscopy;  Laterality: N/A;  . ESOPHAGOGASTRODUODENOSCOPY (EGD) WITH PROPOFOL N/A 07/22/2013   Procedure: ESOPHAGOGASTRODUODENOSCOPY (EGD) WITH PROPOFOL;  Surgeon: Cleotis Nipper, MD;  Location: [facility];  Service: Endoscopy;  Laterality: N/A;  . EYE SURGERY Bilateral 2005   lasix  . FRACTURE SURGERY Right    foot  . GALLBLADDER SURGERY    . SHOULDER SURGERY Left sept 2014   cartlidge repair    His Family History Is Significant For: Family History  Problem Relation Age of Onset  . Cancer Mother 34       colon, stomach  . Cancer Father        testicular  . Cancer Maternal Grandmother        Colon and Breast cancer  . Cancer Maternal Grandfather        Colon cancer  . Cancer Maternal Uncle        colon  . Cancer Maternal Uncle        colon    His Social History Is Significant For: Social History   Social History  . Marital status: Significant Other    Spouse name: Juliann Pulse  . Number of children: 0  . Years of education: 11   Occupational History  . 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 Smoking status: Never Smoker  . Smokeless tobacco: Never Used  . Alcohol use 0.0 - 0.6 oz/week     Comment: rare  . Drug use: No  . Sexual activity: Yes    Partners: Female    Birth control/ protection: Condom     Comment: 1 sexual partner in last year   Other Topics Concern  . None   Social History Narrative   Lives with his significant other, Juliann Pulse, and their cats.    His Allergies Are:  No Known Allergies:   His Current Medications Are:  Outpatient Encounter Prescriptions as of 01/21/2017  Medication Sig  . fluticasone (FLONASE) 50 MCG/ACT nasal spray Place 2 sprays into both nostrils daily.  . Polyethylene Glycol 3350 (MIRALAX PO) Take by mouth daily.  . [DISCONTINUED] amoxicillin-clavulanate (AUGMENTIN)  875-125 MG tablet Take 1  tablet by mouth 2 (two) times daily.  . [DISCONTINUED] ondansetron (ZOFRAN) 4 MG tablet Take 1 tablet (4 mg total) by mouth every 8 (eight) hours as needed for nausea or vomiting.   No facility-administered encounter medications on file as of 01/21/2017.   :  Review of Systems:  Out of a complete 14 point review of systems, all are reviewed and negative with the exception of these symptoms as listed below: Review of Systems  Neurological:       Pt presents today to discuss his cpap compliance. Pt reports that his cpap is going well. Pt reports having a fever yesterday, which has since resolved.    Objective:  Neurologic Exam  Physical Exam Physical Examination:   Vitals:   01/21/17 1505  BP: 120/70  Pulse: 69  Temp: 97.6 F (36.4 C)   General Examination: The patient is a very pleasant 51 y.o. male in no acute distress. He appears well-developed and well-nourished and well groomed.   HEENT: Normocephalic, atraumatic, pupils are equal, round and reactive to light and accommodation. Extraocular tracking is good without limitation to gaze excursion or nystagmus noted. Normal smooth pursuit is noted. Hearing is grossly intact. Face is symmetric with normal facial animation and normal facial sensation. Speech is clear with no dysarthria noted. There is no hypophonia. There is no lip, neck/head, jaw or voice tremor. Neck is supple with full range of passive and active motion. There are no carotid bruits on auscultation. Oropharynx exam reveals: Mild to moderate mouth dryness, adequate dental hygiene and moderate airway crowding. Mallampati is class II. Tongue protrudes centrally and palate elevates symmetrically. Tonsils are 2+ in size.   Chest: Clear to auscultation without wheezing, rhonchi or crackles noted.  Heart: S1+S2+0, regular and normal without murmurs, rubs or gallops noted.   Abdomen: Soft, non-tender and non-distended with normal bowel sounds  appreciated on auscultation.  Extremities: There is no pitting edema in the distal lower extremities bilaterally. Pedal pulses are intact.  Skin: Warm and dry without trophic changes noted. There are no varicose veins.  Musculoskeletal: exam reveals no obvious joint deformities, tenderness or joint swelling or erythema, except for mild left knee discomfort.   Neurologically:  Mental status: The patient is awake, alert and oriented in all 4 spheres. His memory, attention, language and knowledge are appropriate. There is no aphasia, agnosia, apraxia or anomia. Speech is clear with normal prosody and enunciation. Thought process is linear. Mood is congruent and affect is normal.  Cranial nerves are as described above under HEENT exam. In addition, shoulder shrug is normal with equal shoulder height noted. Motor exam: Normal bulk, strength and tone is noted. There is no drift, tremor or rebound. Romberg is negative. Reflexes are 2+ throughout. Fine motor skills are intact grossly.  Cerebellar testing shows no dysmetria or intention tremor. There is no truncal or gait ataxia.  Sensory exam is intact to light touch in the upper and lower extremities.  Gait, station and balance are unremarkable. No veering to one side is noted. No leaning to one side is noted. Posture is age-appropriate and stance is narrow based. No problems turning are noted. Tandem walk is unremarkable.  Assessment and Plan:   In summary, Jacob House is a very pleasant 51 year old male with an underlying medical history of carpal tunnel syndrome, shoulder pain, arthritis and obesity, who presents for followup consultation of his severe obstructive sleep apnea, well treated with CPAP of 15 cm with ongoing superb compliance. Of note, he  had a split-night sleep study in March 2015. He is trying to lose weight. He is commended for his weight loss endeavors and success so far. He is requesting a secondary CPAP machine for travel  purposes and I provided a order for this today. 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 His physical exam is stable, has lost about 10 lb in this past year. We talked about his recent compliance data. Some 2 years ago, we also did a pulse oximetry test and it was okay, well enough on CPAP. He did not require supplemental oxygen and had resolution of his severe oxygen desaturations with CPAP treatment at the current level.  He has been using nasal pillows. From my end of things he is doing well. He has benefited from CPAP therapy and has been compliant. His physical exam and neurological exam otherwise nonfocal. He is encouraged to continue to try to lose weight. I suggested a one-year checkup. I answered all his questions today and he was in agreement.  I spent 20 minutes in total face-to-face time with the patient, more than 50% of which was spent in counseling and coordination of care, reviewing test results, reviewing medication and discussing or reviewing the diagnosis of OSA, its prognosis and treatment options. Pertinent laboratory and imaging test results that were available during this visit with the patient were reviewed by me and considered in my medical decision making (see chart for details).

## 2017-01-30 ENCOUNTER — Telehealth: Payer: Self-pay | Admitting: Neurology

## 2017-01-30 NOTE — Telephone Encounter (Signed)
Patient aware that Dr. [name] will return to the office 02/03/17.  Sample letter left on Kristen's desk for her review.

## 2017-01-30 NOTE — Telephone Encounter (Signed)
Patient requesting letter from Dr. [name] regarding flying license.  He has left a sample letter to go by.  Best call back is [phone].

## 2017-02-03 NOTE — Telephone Encounter (Signed)
Ok to write this letter? 

## 2017-02-03 NOTE — Telephone Encounter (Signed)
Please provide a letter of support as requested, patient is 100% compliant with CPAP.

## 2017-02-04 NOTE — Telephone Encounter (Signed)
Patient called stating he will pick up letter.

## 2017-02-04 NOTE — Telephone Encounter (Signed)
Letter placed at the front desk for pick up at the front desk for pick up.

## 2017-02-04 NOTE — Telephone Encounter (Signed)
I called pt to discuss his letter. It is ready. Does he want it mailed or for pick up?  No answer, left a message asking him to call me back.

## 2017-08-21 ENCOUNTER — Encounter: Payer: Self-pay | Admitting: Urgent Care

## 2017-08-21 ENCOUNTER — Ambulatory Visit: Payer: 59 | Admitting: Urgent Care

## 2017-08-21 ENCOUNTER — Ambulatory Visit (INDEPENDENT_AMBULATORY_CARE_PROVIDER_SITE_OTHER): Payer: 59

## 2017-08-21 VITALS — BP 142/80 | HR 56 | Temp 98.0°F | Resp 16 | Ht 67.0 in | Wt 233.4 lb

## 2017-08-21 DIAGNOSIS — R0789 Other chest pain: Secondary | ICD-10-CM

## 2017-08-21 DIAGNOSIS — R1013 Epigastric pain: Secondary | ICD-10-CM | POA: Diagnosis not present

## 2017-08-21 MED ORDER — OMEPRAZOLE 20 MG PO CPDR: 20.0000 mg | DELAYED_RELEASE_CAPSULE | Freq: Every day | ORAL | 3 refills | Status: DC

## 2017-08-21 MED ORDER — RANITIDINE HCL 150 MG PO TABS: 150.0000 mg | ORAL_TABLET | Freq: Two times a day (BID) | ORAL | 1 refills | Status: DC

## 2017-08-21 NOTE — Progress Notes (Signed)
  MRN: [id number] DOB: 07/05/1966  Subjective:   Jacob House is a 52 y.o. male presenting for 1 day history of right sided superficial chest wall pain, epigastric pain that started this morning. Both have been constant, mild-moderate in severity. Has not tried any medications. Denies fever, sour brash in throat, cough, pleuritic pain, shob, heart racing, diaphoresis, n/v, diarrhea, constipation, bloody stools, trauma, falls. Denies smoking cigarettes. Has rare drink of alcohol. Drinks 2 lattes, tea daily. Patient has tried to use more decaffeinated drinks.   Jacob House has a current medication list which includes the following prescription(s): fluticasone and polyethylene glycol 3350. Also has No Known Allergies.  Jacob House  has a past medical history of Allergy, Carpal tunnel syndrome, Complication of anesthesia ([id number]), Complication of anesthesia, GERD (gastroesophageal reflux disease), OSA on CPAP (12/08/2013), and Snoring. Also  has a past surgical history that includes Gallbladder surgery; Anal fissure repair (july 2009 and yrs ago); Cholecystectomy (1999); Eye surgery (Bilateral, 2005); Shoulder surgery (Left, sept 2014); Fracture surgery (Right); Esophagogastroduodenoscopy (egd) with propofol (N/A, 07/22/2013); and Colonoscopy with propofol (N/A, 07/22/2013).  Objective:   Vitals: BP (!) 142/80   Pulse (!) 56   Temp 98 F (36.7 C) (Oral)   Resp 16   Ht 5\' 7"  (1.702 m)   Wt 233 lb 6.4 oz (105.9 kg)   SpO2 99%   BMI 36.56 kg/m   BP Readings from Last 3 Encounters:  08/21/17 (!) 142/80  01/21/17 120/70  06/24/16 128/70    Wt Readings from Last 3 Encounters:  08/21/17 233 lb 6.4 oz (105.9 kg)  01/21/17 230 lb (104.3 kg)  06/24/16 246 lb 12.8 oz (111.9 kg)    Physical Exam  Constitutional: He is oriented to person, place, and time. He appears well-developed and well-nourished.  Cardiovascular: Normal rate, regular rhythm and intact distal pulses. Exam reveals no gallop and no  friction rub.  No murmur heard. Pulmonary/Chest: No respiratory distress. He has no wheezes. He has no rales. He exhibits tenderness (focal tenderness over area depicted). He exhibits no mass, no bony tenderness, no laceration, no crepitus, no edema, no deformity, no swelling and no retraction. Right breast exhibits no inverted nipple and no nipple discharge. Left breast exhibits no inverted nipple and no nipple discharge.    Abdominal: Soft. Bowel sounds are normal. He exhibits no distension and no mass. There is no tenderness. There is no guarding.  Neurological: He is alert and oriented to person, place, and time.  Skin: Skin is warm and dry.  Psychiatric: He has a normal mood and affect.   Dg Chest 2 View  Result Date: 08/21/2017 CLINICAL DATA:  Right-sided chest pain EXAM: CHEST  2 VIEW COMPARISON:  08/26/2013 FINDINGS: The lungs are clear without focal pneumonia, edema, pneumothorax or pleural effusion. The cardio pericardial silhouette is enlarged. The visualized bony structures of the thorax are intact. IMPRESSION: No active cardiopulmonary disease. Electronically Signed   By: Misty Stanley M.D.   On: 08/21/2017 16:09    Assessment and Plan :   Abdominal pain, epigastric - Plan: Comprehensive metabolic panel, H. pylori breath test  Right-sided chest wall pain - Plan: DG Chest 2 View  Will use Prilosec, Zantac, labs pending. Use APAP for chest wall pain. Return-to-clinic precautions discussed, patient verbalized understanding.  Jaynee Eagles, PA-C [facility] [phone] 08/21/2017  3:26 PM

## 2017-08-21 NOTE — Patient Instructions (Addendum)
You may take 500mg  Tylenol every 6 hours for chest wall pain and inflammation.    Chest Wall Pain Chest wall pain is pain in or around the bones and muscles of your chest. Sometimes, an injury causes this pain. Sometimes, the cause may not be known. This pain may take several weeks or longer to get better. Follow these instructions at home: Pay attention to any changes in your symptoms. Take these actions to help with your pain:  Rest as told by your health care provider.  Avoid activities that cause pain. These include any activities that use your chest muscles or your abdominal and side muscles to lift heavy items.  If directed, apply ice to the painful area: ? Put ice in a plastic bag. ? Place a towel between your skin and the bag. ? Leave the ice on for 20 minutes, 2-3 times per day.  Take over-the-counter and prescription medicines only as told by your health care provider.  Do not use tobacco products, including cigarettes, chewing tobacco, and e-cigarettes. If you need help quitting, ask your health care provider.  Keep all follow-up visits as told by your health care provider. This is important.  Contact a health care provider if:  You have a fever.  Your chest pain becomes worse.  You have new symptoms. Get help right away if:  You have nausea or vomiting.  You feel sweaty or light-headed.  You have a cough with phlegm (sputum) or you cough up blood.  You develop shortness of breath. This information is not intended to replace advice given to you by your health care provider. Make sure you discuss any questions you have with your health care provider. Document Released: 07/22/2005 Document Revised: 11/30/2015 Document Reviewed: 10/17/2014 Elsevier Interactive Patient Education  2018 [employer].     Abdominal Pain, Adult Abdominal pain can be caused by many things. Often, abdominal pain is not serious and it gets better with no treatment or by being treated  at home. However, sometimes abdominal pain is serious. Your health care provider will do a medical history and a physical exam to try to determine the cause of your abdominal pain. Follow these instructions at home:  Take over-the-counter and prescription medicines only as told by your health care provider. Do not take a laxative unless told by your health care provider.  Drink enough fluid to keep your urine clear or pale yellow.  Watch your condition for any changes.  Keep all follow-up visits as told by your health care provider. This is important. Contact a health care provider if:  Your abdominal pain changes or gets worse.  You are not hungry or you lose weight without trying.  You are constipated or have diarrhea for more than 2-3 days.  You have pain when you urinate or have a bowel movement.  Your abdominal pain wakes you up at night.  Your pain gets worse with meals, after eating, or with certain foods.  You are throwing up and cannot keep anything down.  You have a fever. Get help right away if:  Your pain does not go away as soon as your health care provider told you to expect.  You cannot stop throwing up.  Your pain is only in areas of the abdomen, such as the right side or the left lower portion of the abdomen.  You have bloody or black stools, or stools that look like tar.  You have severe pain, cramping, or bloating in your abdomen.  You have signs of dehydration, such as: ? Dark urine, very little urine, or no urine. ? Cracked lips. ? Dry mouth. ? Sunken eyes. ? Sleepiness. ? Weakness. This information is not intended to replace advice given to you by your health care provider. Make sure you discuss any questions you have with your health care provider. Document Released: 05/01/2005 Document Revised: 02/09/2016 Document Reviewed: 01/03/2016 Elsevier Interactive Patient Education  2018 [employer].    IF you received an x-ray today, you will  receive an invoice from [facility]. Please contact [facility] at [phone] with questions or concerns regarding your invoice.   IF you received labwork today, you will receive an invoice from [facility]. Please contact [facility] at [phone] with questions or concerns regarding your invoice.   Our billing staff will not be able to assist you with questions regarding bills from these companies.  You will be contacted with the lab results as soon as they are available. The fastest way to get your results is to activate your My Chart account. Instructions are located on the last page of this paperwork. If you have not heard from [place] regarding the results in 2 weeks, please contact this office.

## 2017-08-22 LAB — COMPREHENSIVE METABOLIC PANEL
ALBUMIN: 4.6 g/dL (ref 3.5–5.5)
ALK PHOS: 64 IU/L (ref 39–117)
ALT: 26 IU/L (ref 0–44)
AST: 20 IU/L (ref 0–40)
Albumin/Globulin Ratio: 1.7 (ref 1.2–2.2)
BILIRUBIN TOTAL: 0.5 mg/dL (ref 0.0–1.2)
BUN / CREAT RATIO: 12 (ref 9–20)
BUN: 13 mg/dL (ref 6–24)
CHLORIDE: 100 mmol/L (ref 96–106)
CO2: 25 mmol/L (ref 20–29)
Calcium: 9.9 mg/dL (ref 8.7–10.2)
Creatinine, Ser: 1.11 mg/dL (ref 0.76–1.27)
GFR calc Af Amer: 87 mL/min/{1.73_m2} (ref >59)
GFR calc non Af Amer: 75 mL/min/{1.73_m2} (ref >59)
GLOBULIN, TOTAL: 2.7 g/dL (ref 1.5–4.5)
GLUCOSE: 84 mg/dL (ref 65–99)
Potassium: 4.2 mmol/L (ref 3.5–5.2)
SODIUM: 142 mmol/L (ref 134–144)
Total Protein: 7.3 g/dL (ref 6.0–8.5)

## 2017-08-23 LAB — H. PYLORI BREATH TEST: H PYLORI BREATH TEST: NEGATIVE

## 2017-12-10 ENCOUNTER — Telehealth: Payer: Self-pay | Admitting: Neurology

## 2017-12-10 DIAGNOSIS — G4733 Obstructive sleep apnea (adult) (pediatric): Secondary | ICD-10-CM

## 2017-12-10 DIAGNOSIS — Z9989 Dependence on other enabling machines and devices: Principal | ICD-10-CM

## 2017-12-10 NOTE — Telephone Encounter (Signed)
Patient would like Rx to get a smaller CPAP machine. He had Rx before but lost it. Please call patient when ready for pickup.

## 2017-12-10 NOTE — Addendum Note (Signed)
Addended by: Lester Bradshaw A on: 12/10/2017 12:08 PM   Modules accepted: Orders

## 2017-12-10 NOTE — Telephone Encounter (Signed)
Received VO from Dr. [name] to renew the 01/2017 travel cpap order. Order placed.

## 2017-12-10 NOTE — Telephone Encounter (Signed)
I called pt, advised him that his travel cpap order is available for pick up at the front desk. Pt verbalized understanding.

## 2017-12-12 NOTE — Telephone Encounter (Signed)
Pt has been told that he needs to be seen before he can get the prescription filled for the new CPAP.  Pt is leaving on 06-07 and would need to be seen before then in order to get the new CPAP.  Please call pt if there is a way pt can be seen before 6-07.  Pt is on wait list

## 2017-12-12 NOTE — Telephone Encounter (Signed)
I called pt. Offered 12/16/17 at 830am with Dr. [name]. She had a cx. He declined at first stating that is too short of a notice.  I advised she has no other openings. He is going to speak with work and call back Monday to let us know. Advised I will speak with her RN on Monday to see if there are any other options to fit him in. He verbalized understanding.

## 2017-12-15 NOTE — Telephone Encounter (Signed)
Pt called on 12/12/17 and scheduled an appt on 12/17/17.

## 2017-12-16 ENCOUNTER — Encounter: Payer: Self-pay | Admitting: Neurology

## 2017-12-16 NOTE — Telephone Encounter (Signed)
I called pt, reminded him to bring his cpap chip to his appt with Dr. [name] tomorrow. Pt verbalized understanding.

## 2017-12-17 ENCOUNTER — Encounter

## 2017-12-17 ENCOUNTER — Ambulatory Visit: Payer: 59 | Admitting: Neurology

## 2017-12-17 ENCOUNTER — Encounter: Payer: Self-pay | Admitting: Neurology

## 2017-12-17 VITALS — BP 113/64 | HR 63 | Ht 67.0 in | Wt 232.0 lb

## 2017-12-17 DIAGNOSIS — G4733 Obstructive sleep apnea (adult) (pediatric): Secondary | ICD-10-CM

## 2017-12-17 DIAGNOSIS — Z9989 Dependence on other enabling machines and devices: Secondary | ICD-10-CM | POA: Diagnosis not present

## 2017-12-17 NOTE — Patient Instructions (Addendum)
Please continue using your CPAP regularly. While your insurance requires that you use CPAP at least 4 hours each night on 70% of the nights, I recommend, that you not skip any nights and use it throughout the night if you can. Getting used to CPAP and staying with the treatment long term does take time and patience and discipline. Untreated obstructive sleep apnea when it is moderate to severe can have an adverse impact on cardiovascular health and raise her risk for heart disease, arrhythmias, hypertension, congestive heart failure, stroke and diabetes. Untreated obstructive sleep apnea causes sleep disruption, nonrestorative sleep, and sleep deprivation. This can have an impact on your day to day functioning and cause daytime sleepiness and impairment of cognitive function, memory loss, mood disturbance, and problems focussing. Using CPAP regularly can improve these symptoms. Good luck with your flight school! You have been very good with your CPAP usage, keep up the good work, try to achieve 7-8 hours of sleep! Keep well hydrated with water. I will see you back in one year! You should be able to fill your prescription for your travel CPAP.

## 2017-12-17 NOTE — Progress Notes (Signed)
Subjective:    Patient ID: Jacob House is a 52 y.o. male.  HPI     Interim history:  Mr. Tietze is a 52 year old right-handed gentleman with an underlying medical history of carpal tunnel syndrome, shoulder pain, and obesity, who presents for followup consultation of his severe obstructive sleep apnea, treated with CPAP. He is unaccompanied today. I last saw him on 01/21/2017, at which time he was fully compliant with his CPAP. I gave him a prescription for travel CPAP machine. He reported doing well, he was working on his Merchandiser, retail and working on weight loss. I suggested a one-year checkup.  He requested a replacement prescription for his travel CPAP in the interim, which we provided, but he could not fill it until he has a face-to-face visit as I understand.  Today, 12/17/17 (all dictated new, as well as above notes, some dictation done in note pad or Word, outside of chart, may appear as copied):   Today, 12/17/2017: I reviewed his CPAP compliance data from the past 30 days from 11/17/2017 through 12/16/2017, during which time he used his CPAP every night with percent used days greater than 4 hours at 100%, indicating superb compliance with an average usage of 6 hours and 50 minutes, residual AHI at goal at 2.6 per hour, leak fluctuates, some days better than others, average leakage is acceptable. We also reviewed his compliance data for the last year, he missed 1 day and has been fully compliant, apnea score at goal and leak on the low side. He reports doing well. Still and flight school. He is also working at [employer] and at [employer] school as well. He is doing well, has a physical coming up next month with his primary care physician. He has had no interim medication or medical history changes. Planning a trip to [place] with his family (he won't be flying the plane, of course!). He is working on weight loss, in the recent past she has been able to lose weight, but has  plateaued, compared to last year he is about the same.  The patient's allergies, current medications, family history, past medical history, past social history, past surgical history and problem list were reviewed and updated as appropriate.    Previously (copied from previous notes for reference):   I saw him on 11/28/2015, at which time he was doing well, he was fully compliant with his CPAP. I received his compliance data after the visit via fax from his DME company and he was under percent compliant at the time, he was trying to lose weight. 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   I reviewed his CPAP compliance data from 12/22/2016 through 01/20/2017 which is a total of 30 days, during which time he used his machine every night with percent used days greater than 4 hours at 100%, average usage of 6 hours and 48 minutes, residual AHI 2.4 per hour, leak low generally speaking, pressure of 15 cm.   I saw him on 11/17/2014 at which time he reported full compliance with CPAP. I reviewed his pulse ox report which indicated that he was appropriately treated with CPAP only and did not need supplemental oxygen thankfully. He changed from a nasal mask to nasal pillows interface in February 2016 and like to it. He had some issues with low back pain.    I reviewed his CPAP compliance data from 10/29/15 to 11/27/15, which is a  total of 30 days, during which time He used his machine 29 days with percent used days greater than 4 hours at 96.7%, indicating excellent compliance with an average usage of 6 hours and 16 minutes, residual AHI 3.4 per hour, pressure at 15 cm, leak low.    Addendum 11/17/2014: Later in the afternoon I received his CPAP compliance data from his DME company via fax. This was from 08/24/2014 through 09/22/2014 which is a total of 30 days during which time he used his machine every night with percent used days greater than 4 hours at  100%, indicating superb compliance with an average usage of 6 hours and 35 minutes. Residual AHI acceptable at 3.9 per hour on a pressure of 15 cm and leak low. This indicates superb compliance and of course supports his verbal report of full compliance.    I saw him on 05/09/2014, at which time he reported compliance with treatment. He had some residual daytime somnolence and some issues with mask fitting. He was using a [place] on nasal mask. He was still not always fully rested in the mornings. He was given a prescription for Voltaren by his orthopedic doctor for his arthritis. He was trying to lose weight and had in fact lost some weight. I asked him to meet with his DME provider for mask refit. I also suggested we proceed with an overnight pulse oximetry test to ensure that his oxygen saturations were fine on treatment.   I reviewed the patient's overnight pulse oximetry report from 09/23/2014 while on CPAP therapy at night. His average oxygen saturation was 95.2%, total test time was 6 hours and 11 minutes. Lowest oxygen saturation was 86%. Time below 88% saturation was 28 seconds. Based on these test results, it appears that the patient is appropriately treated on room air with CPAP only and does not require additional supplemental oxygen at this time. Average pulse rate was 60.2 bpm, maximum was 99 bpm, minimum was 46 bpm during the testing time.    I reviewed his compliance data from 04/10/14 to 05/08/14, which is 30 days, during which time he was 100%, percent used days greater than 4 hours was 90%, indicating excellent compliance. Residual AHI was 3.1 per hour. Leak was at times quite high.    I saw him on 12/08/2013, at which time I advised him regarding his sleep study results and his compliance. He indicated using his CPAP regularly. There was no data on his SD card. His [employer] CPAP machine indicated that he had been using it every day for the previous 30 days. He reported sleeping well  with CPAP and having adjusted fairly well to it. His sleepiness was improved. I asked him to continue using CPAP regularly.   I reviewed the patient's CPAP compliance data from 11/09/2013 to 12/08/2013, which is a total of 30 days, during which time the patient used CPAP every day. The average usage for all days was 6 hours and 39 minutes. The percent used days greater than 4 hours was 100 %, indicating superb compliance. The residual AHI was 5.6 per hour, indicating an adequate treatment pressure of 15 cwp. Air leak from the mask was low.   I first met him on 08/31/2013, at which time he reported snoring and excessive daytime somnolence as well as witnessed apneas. I requested he return for sleep study. He had a split-night sleep study on 10/06/2013: Baseline sleep efficiency was significantly reduced at 45.5% with a long sleep latency of 116.5 minutes  and wake after sleep onset of 34 minutes with mild sleep fragmentation noted. He had an elevated arousal index. He had markedly increased percentage of stage II sleep, near absence of slow-wave sleep, and absence of REM sleep prior to CPAP. He had no clinically significant periodic leg movements or EKG changes. He had moderate to loud snoring. His total AHI was 57.8 per hour. Baseline oxygen saturation was 91%, nadir was 67%. He was then titrated on CPAP with sleep efficiency improved at 73.3% with a latency to sleep of 50.5 minutes and wake after sleep onset of 3.5 minutes with mild sleep fragmentation noted. His arousal index was normal. He had a increased percentage of stage II sleep, near absence of slow-wave sleep and an increased percentage of REM sleep at 34.4%. Average oxygen saturation was improved at 93%, nadir was 70% which was during the initial titration period. On the final pressure of 14 cm his O2 nadir was 91% but he did not achieve REM sleep on the final pressure. He was titrated from 4-14 cm with reduction of the AHI of 6.2 events per hour at  the final pressure. Based on the test results I did start the patient on CPAP at a pressure of 15 cm as he had a residual AHI of over 5/h on the final pressure of 14 cm during the study.     I reviewed his compliance data from 10/26/2013 through 11/07/2013 which is a total of 13 days during which time he was CPAP every night. Percent used days greater than 4 hours was 100%, average usage was 7 hours and 17 minutes. Residual AHI was 8 per hour on a pressure of 15 cm with no significant leak.    His Past Medical History Is Significant For: Past Medical History:  Diagnosis Date  . Allergy   . Carpal tunnel syndrome   . Complication of anesthesia 04-23-2013   pt told by anesthesia after probably has sleep apnea  . Complication of anesthesia    cannot lie on left side due to recent shoulder surgery per pt  . GERD (gastroesophageal reflux disease)   . OSA on CPAP 12/08/2013  . Snoring     His Past Surgical History Is Significant For: Past Surgical History:  Procedure Laterality Date  . ANAL FISSURE REPAIR  july 2009 and yrs ago   x 2   . CHOLECYSTECTOMY  1999  . COLONOSCOPY WITH PROPOFOL N/A 07/22/2013   Procedure: COLONOSCOPY WITH PROPOFOL;  Surgeon: Cleotis Nipper, MD;  Location: [facility];  Service: Endoscopy;  Laterality: N/A;  . ESOPHAGOGASTRODUODENOSCOPY (EGD) WITH PROPOFOL N/A 07/22/2013   Procedure: ESOPHAGOGASTRODUODENOSCOPY (EGD) WITH PROPOFOL;  Surgeon: Cleotis Nipper, MD;  Location: [facility];  Service: Endoscopy;  Laterality: N/A;  . EYE SURGERY Bilateral 2005   lasix  . FRACTURE SURGERY Right    foot  . GALLBLADDER SURGERY    . SHOULDER SURGERY Left sept 2014   cartlidge repair    His Family History Is Significant For: Family History  Problem Relation Age of Onset  . Cancer Mother 25       colon, stomach  . Cancer Father        testicular  . Cancer Maternal Grandmother        Colon and Breast cancer  . Cancer Maternal Grandfather        Colon cancer  .  Cancer Maternal Uncle        colon  . Cancer Maternal Uncle  colon    His Social History Is Significant For: Social History   Socioeconomic History  . Marital status: Significant Other    Spouse name: Juliann Pulse  . Number of children: 0  . Years of education: 46  . Highest education level: Not on file  Occupational History  . Occupation: Glass blower/designer    Employer: [employer]  . Financial resource strain: Not on file  . Food insecurity:    Worry: Not on file    Inability: Not on file  . Transportation needs:    Medical: Not on file    Non-medical: Not on file  Tobacco Use  . Smoking status: Never Smoker  . Smokeless tobacco: Never Used  Substance and Sexual Activity  . Alcohol use: Yes    Alcohol/week: 0.0 - 0.6 oz    Comment: rare  . Drug use: No  . Sexual activity: Yes    Partners: Female    Birth control/protection: Condom    Comment: 1 sexual partner in last year  Lifestyle  . Physical activity:    Days per week: Not on file    Minutes per session: Not on file  . Stress: Not on file  Relationships  . Social connections:    Talks on phone: Not on file    Gets together: Not on file    Attends religious service: Not on file    Active member of club or organization: Not on file    Attends meetings of clubs or organizations: Not on file    Relationship status: Not on file  Other Topics Concern  . Not on file  Social History Narrative   Lives with his significant other, Juliann Pulse, and their cats.    His Allergies Are:  No Known Allergies:   His Current Medications Are:  Outpatient Encounter Medications as of 12/17/2017  Medication Sig  . fluticasone (FLONASE) 50 MCG/ACT nasal spray Place 2 sprays into both nostrils daily.  . Polyethylene Glycol 3350 (MIRALAX PO) Take by mouth daily.  . [DISCONTINUED] omeprazole (PRILOSEC) 20 MG capsule Take 1 capsule (20 mg total) by mouth daily.  . [DISCONTINUED] ranitidine (ZANTAC) 150 MG tablet Take  1 tablet (150 mg total) by mouth 2 (two) times daily.   No facility-administered encounter medications on file as of 12/17/2017.   :  Review of Systems:  Out of a complete 14 point review of systems, all are reviewed and negative with the exception of these symptoms as listed below:  Review of Systems  Neurological:       Pt presents today for his yearly cpap follow up. 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    Objective:  Neurological Exam  Physical Exam Physical Examination:   Vitals:   12/17/17 0920  BP: 113/64  Pulse: 63   General Examination: The patient is a very pleasant 52 y.o. male in no acute distress. He appears well-developed and well-nourished and well groomed. Good spirits.   HEENT:Normocephalic, atraumatic, pupils are equal, round and reactive to light and accommodation. Extraocular tracking is good without limitation to gaze excursion or nystagmus noted. Normal smooth pursuit is noted. Hearing is grossly intact. Face is symmetric with normal facial animation and normal facial sensation. Speech is clear with no dysarthria noted. There is no hypophonia. There is no lip, neck/head, jaw or voice tremor. Neck is supple with full range of passive and active motion. There are no carotid bruits on auscultation.  Oropharynx exam reveals: Mild mouth dryness, adequate dental hygiene and moderate airway crowding. Mallampati is class II. Tongue protrudes centrally and palate elevates symmetrically. Tonsils are 2+ in size.   Chest:Clear to auscultation without wheezing, rhonchi or crackles noted.  Heart:S1+S2+0, regular and normal without murmurs, rubs or gallops noted.   Abdomen:Soft, non-tender and non-distended with normal bowel sounds appreciated on auscultation.  Extremities:There is no pitting edema in the distal lower extremities bilaterally.   Skin: Warm and dry without trophic changes noted. There are no varicose  veins.  Musculoskeletal: exam reveals no obvious joint deformities, tenderness or joint swelling or erythema.   Neurologically:  Mental status: The patient is awake, alert and oriented in all 4 spheres. His memory, attention, language and knowledge are appropriate. There is no aphasia, agnosia, apraxia or anomia. Speech is clear with normal prosody and enunciation. Thought process is linear. Mood is congruent and affect is normal.  Cranial nerves are as described above under HEENT exam. In addition, shoulder shrug is normal with equal shoulder height noted. Motor exam: Normal bulk, strength and tone is noted. There is no tremor, Romberg is negative. Reflexes are 2+ throughout. Fine motor skills are intact grossly.  Cerebellar testing shows no dysmetria or intention tremor. There is no truncal or gait ataxia.  Sensory exam is intact to light touch in the upper and lower extremities.  Gait, station and balance are unremarkable. No veering to one side is noted. No leaning to one side is noted. Posture is age-appropriate and stance is narrow based. No problems turning are noted. Tandem walk is unremarkable.  Assessment and Plan:   In summary, Lundon Verdejo is a very pleasant 52 year old male with an underlying medical history of carpal tunnel syndrome, arthritis and obesity, who presents for followup consultation of his obstructive sleep apnea, well treated with CPAP of 15 cm with ongoing superb compliance and ongoing good results. He is fully compliant with treatment, he has been compliant for the past year even. Of note, he had a split-night sleep study in March 2015. He is trying to lose weight. He is commended for his weight loss endeavors and and his treatment adherence with CPAP. 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 I provided a prescription for a travel CPAP last week. He is encouraged to talk to his DME company  about this. Some 2 years ago, we also did a pulse oximetry test and it was okay, well enough maintained oxygen saturations overnight while on CPAP. He did not require supplemental oxygen and had resolution of his severe oxygen desaturations with CPAP treatment at the current level. He has been using nasal pillows. He is keeping up with his supplies through [employer] From my end of things he is doing well. He has benefited from CPAP therapy and has been compliant. His physical exam and neurological exam otherwise nonfocal. He is encouraged to continue to try to lose weight. I suggested a one-year checkup. I answered all his questions today and he was in agreement.  I spent 20 minutes in total face-to-face time with the patient, more than 50% of which was spent in counseling and coordination of care, reviewing test results, reviewing medication and discussing or reviewing the diagnosis of OSA, its prognosis and treatment options. Pertinent laboratory and imaging test results that were available during this visit with the patient were reviewed by me and considered in my medical decision making (see  chart for details).

## 2018-01-22 ENCOUNTER — Ambulatory Visit: Payer: Managed Care, Other (non HMO) | Admitting: Neurology

## 2018-01-30 ENCOUNTER — Telehealth: Payer: Self-pay | Admitting: Neurology

## 2018-01-30 NOTE — Telephone Encounter (Addendum)
Pt is requesting a compliance letter stating that he is properly using his CPAP and that he does not have excessive sleepiness.  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 Pt states he needs this within the next week to week in a half

## 2018-02-02 NOTE — Telephone Encounter (Addendum)
Pt has called back to inform RN Cyril Mourning that so long as he is able to get the letter by Monday or Tuesday of next week that would be okay. Pt has asked that the letter states he does Not have daytime excessive sleepiness

## 2018-02-02 NOTE — Telephone Encounter (Signed)
All of our sleep physicians are out of the office this week.   I called pt discuss if he can wait for his letter until next week when Dr. [name] returns since all of our sleep physicians are out of the office. If not, I will have to ask the work in physician if they are comfortable confirming cpap compliance.   No answer, left a message asking pt to call me back. If pt calls back, please confirm this with him.

## 2018-02-09 NOTE — Telephone Encounter (Signed)
I spoke with Dr. [name]. 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  Letter created, sent to pt's mychart.  I called pt to advised him of this. No answer, left a message asking him to call me back. If pt calls back, please inform him that the letter he requested has been sent to his mychart account. If he needs to pick it up, I can do that as well.

## 2018-02-09 NOTE — Telephone Encounter (Signed)
Pt called, advised that the letter he had requested had been sent via mychart

## 2018-02-24 ENCOUNTER — Encounter: Payer: Self-pay | Admitting: Emergency Medicine

## 2018-02-24 ENCOUNTER — Ambulatory Visit: Payer: 59 | Admitting: Emergency Medicine

## 2018-02-24 VITALS — BP 114/70 | HR 59 | Temp 98.5°F | Resp 17 | Ht 67.5 in | Wt 234.0 lb

## 2018-02-24 DIAGNOSIS — L03031 Cellulitis of right toe: Secondary | ICD-10-CM | POA: Diagnosis not present

## 2018-02-24 MED ORDER — CEPHALEXIN 500 MG PO CAPS: 500.0000 mg | ORAL_CAPSULE | Freq: Three times a day (TID) | ORAL | 0 refills | Status: AC

## 2018-02-24 NOTE — Patient Instructions (Addendum)
     IF you received an x-ray today, you will receive an invoice from [facility]. Please contact [facility] at [phone] with questions or concerns regarding your invoice.   IF you received labwork today, you will receive an invoice from [facility]. Please contact [facility] at [phone] with questions or concerns regarding your invoice.   Our billing staff will not be able to assist you with questions regarding bills from these companies.  You will be contacted with the lab results as soon as they are available. The fastest way to get your results is to activate your My Chart account. Instructions are located on the last page of this paperwork. If you have not heard from us regarding the results in 2 weeks, please contact this office.     Paronychia Paronychia is an infection of the skin. It happens near a fingernail or toenail. It may cause pain and swelling around the nail. Usually, it is not serious and it clears up with treatment. Follow these instructions at home:  Soak the fingers or toes in warm water as told by your doctor. You may be told to do this for 20 minutes, 2-3 times a day.  Keep the area dry when you are not soaking it.  Take medicines only as told by your doctor.  If you were given an antibiotic medicine, finish all of it even if you start to feel better.  Keep the affected area clean.  Do not try to drain a fluid-filled bump yourself.  Wear rubber gloves when putting your hands in water.  Wear gloves if your hands might touch cleaners or chemicals.  Follow your doctor's instructions about: ? Wound care. ? Bandage (dressing) changes and removal. Contact a doctor if:  Your symptoms get worse or do not improve.  You have a fever or chills.  You have redness spreading from the affected area.  You have more fluid, blood, or pus coming from the affected area.  Your finger or knuckle is swollen or is hard to move. This information is  not intended to replace advice given to you by your health care provider. Make sure you discuss any questions you have with your health care provider. Document Released: 07/10/2009 Document Revised: 12/28/2015 Document Reviewed: 06/29/2014 Elsevier Interactive Patient Education  2018 [employer]  

## 2018-02-24 NOTE — Progress Notes (Signed)
Jacob House 52 y.o.   Chief Complaint  Patient presents with  . Toe Injury    right     HISTORY OF PRESENT ILLNESS: This is a 52 y.o. male complaining of right big toe infection.  HPI   Prior to Admission medications   Medication Sig Start Date End Date Taking? Authorizing Provider  fluticasone (FLONASE) 50 MCG/ACT nasal spray Place 2 sprays into both nostrils daily.   Yes [provider]  Polyethylene Glycol 3350 (MIRALAX PO) Take by mouth daily.   Yes [provider]    No Known Allergies  Patient Active Problem List   Diagnosis Date Noted  . OSA on CPAP 12/08/2013  . OSA on CPAP 12/08/2013  . Colon polyps 08/06/2012  . Carpal tunnel syndrome 06/13/2012    Past Medical History:  Diagnosis Date  . Allergy   . Carpal tunnel syndrome   . Complication of anesthesia 04-23-2013   pt told by anesthesia after probably has sleep apnea  . Complication of anesthesia    cannot lie on left side due to recent shoulder surgery per pt  . GERD (gastroesophageal reflux disease)   . OSA on CPAP 12/08/2013  . Snoring     Past Surgical History:  Procedure Laterality Date  . ANAL FISSURE REPAIR  july 2009 and yrs ago   x 2   . CHOLECYSTECTOMY  1999  . COLONOSCOPY WITH PROPOFOL N/A 07/22/2013   Procedure: COLONOSCOPY WITH PROPOFOL;  Surgeon: Cleotis Nipper, MD;  Location: [facility];  Service: Endoscopy;  Laterality: N/A;  . ESOPHAGOGASTRODUODENOSCOPY (EGD) WITH PROPOFOL N/A 07/22/2013   Procedure: ESOPHAGOGASTRODUODENOSCOPY (EGD) WITH PROPOFOL;  Surgeon: Cleotis Nipper, MD;  Location: [facility];  Service: Endoscopy;  Laterality: N/A;  . EYE SURGERY Bilateral 2005   lasix  . FRACTURE SURGERY Right    foot  . GALLBLADDER SURGERY    . SHOULDER SURGERY Left sept 2014   [place] repair    Social History   Socioeconomic History  . Marital status: Significant Other    Spouse name: Juliann Pulse  . Number of children: 0  . Years of education: 26  . Highest  education level: Not on file  Occupational History  . Occupation: Glass blower/designer    Employer: [employer]  . Financial resource strain: Not on file  . Food insecurity:    Worry: Not on file    Inability: Not on file  . Transportation needs:    Medical: Not on file    Non-medical: Not on file  Tobacco Use  . Smoking status: Never Smoker  . Smokeless tobacco: Never Used  Substance and Sexual Activity  . Alcohol use: Yes    Alcohol/week: 0.0 - 0.6 oz    Comment: rare  . Drug use: No  . Sexual activity: Yes    Partners: Female    Birth control/protection: Condom    Comment: 1 sexual partner in last year  Lifestyle  . Physical activity:    Days per week: Not on file    Minutes per session: Not on file  . Stress: Not on file  Relationships  . Social connections:    Talks on phone: Not on file    Gets together: Not on file    Attends religious service: Not on file    Active member of club or organization: Not on file    Attends meetings of clubs or organizations: Not on file    Relationship status: Not on file  .  Intimate partner violence:    Fear of current or ex partner: Not on file    Emotionally abused: Not on file    Physically abused: Not on file    Forced sexual activity: Not on file  Other Topics Concern  . Not on file  Social History Narrative   Lives with his significant other, Juliann Pulse, and their cats.    Family History  Problem Relation Age of Onset  . Cancer Mother 35       colon, stomach  . Cancer Father        testicular  . Cancer Maternal Grandmother        Colon and Breast cancer  . Cancer Maternal Grandfather        Colon cancer  . Cancer Maternal Uncle        colon  . Cancer Maternal Uncle        colon     Review of Systems  Constitutional: Negative.  Negative for chills and fever.  Respiratory: Negative for shortness of breath.   Gastrointestinal: Negative for nausea and vomiting.  Neurological: Negative.  Negative  for dizziness and headaches.  Endo/Heme/Allergies: Negative.     Vitals:   02/24/18 1716  BP: 114/70  Pulse: (!) 59  Resp: 17  Temp: 98.5 F (36.9 C)  SpO2: 98%    Physical Exam  Constitutional: He is oriented to person, place, and time. He appears well-developed and well-nourished.  HENT:  Head: Normocephalic and atraumatic.  Eyes: Pupils are equal, round, and reactive to light. EOM are normal.  Neck: Normal range of motion.  Cardiovascular: Normal rate.  Pulmonary/Chest: Effort normal.  Musculoskeletal: Normal range of motion.  Right big toe: Positive paronychia with some discharge  Neurological: He is alert and oriented to person, place, and time.  Skin: Skin is warm and dry.  Psychiatric: He has a normal mood and affect. His behavior is normal.  Vitals reviewed.    ASSESSMENT & PLAN: Kit was seen today for toe injury.  Diagnoses and all orders for this visit:  Acute paronychia of toe of right foot -     cephALEXin (KEFLEX) 500 MG capsule; Take 1 capsule (500 mg total) by mouth 3 (three) times daily for 7 days.    Patient Instructions       IF you received an x-ray today, you will receive an invoice from [facility]. Please contact [facility] at [phone] with questions or concerns regarding your invoice.   IF you received labwork today, you will receive an invoice from [facility]. Please contact [facility] at [phone] with questions or concerns regarding your invoice.   Our billing staff will not be able to assist you with questions regarding bills from these companies.  You will be contacted with the lab results as soon as they are available. The fastest way to get your results is to activate your My Chart account. Instructions are located on the last page of this paperwork. If you have not heard from [place] regarding the results in 2 weeks, please contact this office.      Paronychia Paronychia is an infection of the skin. It  happens near a fingernail or toenail. It may cause pain and swelling around the nail. Usually, it is not serious and it clears up with treatment. Follow these instructions at home:  Soak the fingers or toes in warm water as told by your doctor. You may be told to do this for 20 minutes, 2-3 times a day.  Keep the area dry when you are not soaking it.  Take medicines only as told by your doctor.  If you were given an antibiotic medicine, finish all of it even if you start to feel better.  Keep the affected area clean.  Do not try to drain a fluid-filled bump yourself.  Wear rubber gloves when putting your hands in water.  Wear gloves if your hands might touch cleaners or chemicals.  Follow your doctor's instructions about: ? Wound care. ? Bandage (dressing) changes and removal. Contact a doctor if:  Your symptoms get worse or do not improve.  You have a fever or chills.  You have redness spreading from the affected area.  You have more fluid, blood, or pus coming from the affected area.  Your finger or knuckle is swollen or is hard to move. This information is not intended to replace advice given to you by your health care provider. Make sure you discuss any questions you have with your health care provider. Document Released: 07/10/2009 Document Revised: 12/28/2015 Document Reviewed: 06/29/2014 Elsevier Interactive Patient Education  2018 Elsevier Inc.      Agustina Caroli, MD [facility]

## 2018-05-09 ENCOUNTER — Ambulatory Visit: Payer: 59 | Admitting: Family Medicine

## 2018-05-31 DIAGNOSIS — G4733 Obstructive sleep apnea (adult) (pediatric): Secondary | ICD-10-CM | POA: Diagnosis not present

## 2018-07-01 ENCOUNTER — Telehealth: Payer: Self-pay | Admitting: Neurology

## 2018-07-01 NOTE — Telephone Encounter (Signed)
I called pt. He reports that his current cpap machine is making a strange sound. He wants a new cpap. His current cpap is not quite 54 years old yet but it will be in March of 2020. He has new insurance. He doesn't know whether he wants to buy the cpap offline, use [facility], or use a new DME. He will let me know but I did explain why we prefer to use DMEs over online shopping. I discuss with Dr. [name] getting a new order and pt will do some more research on where he wants to get the new machine and I will call him next week.

## 2018-07-01 NOTE — Telephone Encounter (Signed)
Pt requesting a call to discuss new CPAP supplies. Please advise.

## 2018-07-07 NOTE — Telephone Encounter (Signed)
I called pt. He cannot talk right now but will call me back tomorrow morning.

## 2018-07-08 DIAGNOSIS — G4733 Obstructive sleep apnea (adult) (pediatric): Secondary | ICD-10-CM | POA: Diagnosis not present

## 2018-07-09 ENCOUNTER — Encounter: Payer: Self-pay | Admitting: Neurology

## 2018-07-09 NOTE — Telephone Encounter (Signed)
I called pt. He has been scheduled for 07/16/18 at 3:00pm.

## 2018-07-15 ENCOUNTER — Encounter: Payer: Self-pay | Admitting: Neurology

## 2018-07-16 ENCOUNTER — Ambulatory Visit: Payer: BLUE CROSS/BLUE SHIELD | Admitting: Neurology

## 2018-07-16 ENCOUNTER — Encounter: Payer: Self-pay | Admitting: Neurology

## 2018-07-16 VITALS — BP 131/77 | HR 70 | Ht 66.0 in | Wt 240.0 lb

## 2018-07-16 DIAGNOSIS — G4733 Obstructive sleep apnea (adult) (pediatric): Secondary | ICD-10-CM

## 2018-07-16 DIAGNOSIS — Z9989 Dependence on other enabling machines and devices: Secondary | ICD-10-CM

## 2018-07-16 NOTE — Patient Instructions (Signed)
I will write for a new CPAP machine. You may or may not need a follow up in 30-90 days. Otherwise, we can see you for your routine FU in May.

## 2018-07-16 NOTE — Progress Notes (Addendum)
Subjective:    Patient ID: Jacob House is a 52 y.o. male.  HPI     Interim history:  Addendum made on 12/21/2018  (I had documented his visit hx and update elsewhere) Jacob House is a 52 year old right-handed gentleman with an underlying medical history of carpal tunnel syndrome, shoulder pain, and obesity, who presents for followup consultation of his obstructive sleep apnea, treated with CPAP. He is unaccompanied today and presents for interim reevaluation as he needs a new CPAP machine. I last saw him on 12/17/2017, at which time he was fully compliant with his previous CPAP machine. He was advised to follow-up routinely in one year.   He called in the interim reporting issues and abnormal sounds with his CPAP machine and he was eligible for a new CPAP machine.   Today, 07/16/2018: I reviewed his CPAP compliance data from 06/16/2018 through 07/15/2018 which is a total of 30 days, during which time he used his CPAP machine every night with percent used days greater than 4 hours at 100%, indicating superb compliance with an average usage of 6 hours and 55 minutes, residual AHI at goal at 2.5 per hour, pressure at 15, leak on the lower end. He reports that his machine is over 32 years old and is making strange noises at times. He would be eligible for a new machine. Per DME, the motor was possibly starting to give up. He likes the ramp time at 30 min.    The patient's allergies, current medications, family history, past medical history, past social history, past surgical history and problem list were reviewed and updated as appropriate.    Previously (copied from previous notes for reference):    I saw him on 01/21/2017, at which time he was fully compliant with his CPAP. I gave him a prescription for travel CPAP machine. He reported doing well, he was working on his Merchandiser, retail and working on weight loss. I suggested a one-year checkup.   He requested a replacement prescription  for his travel CPAP in the interim, which we provided, but he could not fill it until he has a face-to-face visit as I understand.   I reviewed his CPAP compliance data from the past 30 days from 11/17/2017 through 12/16/2017, during which time he used his CPAP every night with percent used days greater than 4 hours at 100%, indicating superb compliance with an average usage of 6 hours and 50 minutes, residual AHI at goal at 2.6 per hour, leak fluctuates, some days better than others, average leakage is acceptable. We also reviewed his compliance data for the last year, he missed 1 day and has been fully compliant, apnea score at goal and leak on the low side.    I saw him on 11/28/2015, at which time he was doing well, he was fully compliant with his CPAP. I received his compliance data after the visit via fax from his DME company and he was under percent compliant at the time, he was trying to lose weight. 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   I reviewed his CPAP compliance data from 12/22/2016 through 01/20/2017 which is a total of 30 days, during which time he used his machine every night with percent used days greater than 4 hours at 100%, average usage of 6 hours and 48 minutes, residual AHI 2.4 per hour, leak low generally speaking, pressure of 15 cm.    I saw  him on 11/17/2014 at which time he reported full compliance with CPAP. I reviewed his pulse ox report which indicated that he was appropriately treated with CPAP only and did not need supplemental oxygen thankfully. He changed from a nasal mask to nasal pillows interface in February 2016 and like to it. He had some issues with low back pain.    I reviewed his CPAP compliance data from 10/29/15 to 11/27/15, which is a total of 30 days, during which time He used his machine 29 days with percent used days greater than 4 hours at 96.7%, indicating excellent compliance with an average  usage of 6 hours and 16 minutes, residual AHI 3.4 per hour, pressure at 15 cm, leak low.    Addendum 11/17/2014: Later in the afternoon I received his CPAP compliance data from his DME company via fax. This was from 08/24/2014 through 09/22/2014 which is a total of 30 days during which time he used his machine every night with percent used days greater than 4 hours at 100%, indicating superb compliance with an average usage of 6 hours and 35 minutes. Residual AHI acceptable at 3.9 per hour on a pressure of 15 cm and leak low. This indicates superb compliance and of course supports his verbal report of full compliance.    I saw him on 05/09/2014, at which time he reported compliance with treatment. He had some residual daytime somnolence and some issues with mask fitting. He was using a [place] on nasal mask. He was still not always fully rested in the mornings. He was given a prescription for Voltaren by his orthopedic doctor for his arthritis. He was trying to lose weight and had in fact lost some weight. I asked him to meet with his DME provider for mask refit. I also suggested we proceed with an overnight pulse oximetry test to ensure that his oxygen saturations were fine on treatment.   I reviewed the patient's overnight pulse oximetry report from 09/23/2014 while on CPAP therapy at night. His average oxygen saturation was 95.2%, total test time was 6 hours and 11 minutes. Lowest oxygen saturation was 86%. Time below 88% saturation was 28 seconds. Based on these test results, it appears that the patient is appropriately treated on room air with CPAP only and does not require additional supplemental oxygen at this time. Average pulse rate was 60.2 bpm, maximum was 99 bpm, minimum was 46 bpm during the testing time.    I reviewed his compliance data from 04/10/14 to 05/08/14, which is 30 days, during which time he was 100%, percent used days greater than 4 hours was 90%, indicating excellent compliance. Residual  AHI was 3.1 per hour. Leak was at times quite high.    I saw him on 12/08/2013, at which time I advised him regarding his sleep study results and his compliance. He indicated using his CPAP regularly. There was no data on his SD card. His [employer] CPAP machine indicated that he had been using it every day for the previous 30 days. He reported sleeping well with CPAP and having adjusted fairly well to it. His sleepiness was improved. I asked him to continue using CPAP regularly.   I reviewed the patient's CPAP compliance data from 11/09/2013 to 12/08/2013, which is a total of 30 days, during which time the patient used CPAP every day. The average usage for all days was 6 hours and 39 minutes. The percent used days greater than 4 hours was 100 %, indicating superb compliance.  The residual AHI was 5.6 per hour, indicating an adequate treatment pressure of 15 cwp. Air leak from the mask was low.   I first met him on 08/31/2013, at which time he reported snoring and excessive daytime somnolence as well as witnessed apneas. I requested he return for sleep study. He had a split-night sleep study on 10/06/2013: Baseline sleep efficiency was significantly reduced at 45.5% with a long sleep latency of 116.5 minutes and wake after sleep onset of 34 minutes with mild sleep fragmentation noted. He had an elevated arousal index. He had markedly increased percentage of stage II sleep, near absence of slow-wave sleep, and absence of REM sleep prior to CPAP. He had no clinically significant periodic leg movements or EKG changes. He had moderate to loud snoring. His total AHI was 57.8 per hour. Baseline oxygen saturation was 91%, nadir was 67%. He was then titrated on CPAP with sleep efficiency improved at 73.3% with a latency to sleep of 50.5 minutes and wake after sleep onset of 3.5 minutes with mild sleep fragmentation noted. His arousal index was normal. He had a increased percentage of stage II sleep, near  absence of slow-wave sleep and an increased percentage of REM sleep at 34.4%. Average oxygen saturation was improved at 93%, nadir was 70% which was during the initial titration period. On the final pressure of 14 cm his O2 nadir was 91% but he did not achieve REM sleep on the final pressure. He was titrated from 4-14 cm with reduction of the AHI of 6.2 events per hour at the final pressure. Based on the test results I did start the patient on CPAP at a pressure of 15 cm as he had a residual AHI of over 5/h on the final pressure of 14 cm during the study.     I reviewed his compliance data from 10/26/2013 through 11/07/2013 which is a total of 13 days during which time he was CPAP every night. Percent used days greater than 4 hours was 100%, average usage was 7 hours and 17 minutes. Residual AHI was 8 per hour on a pressure of 15 cm with no significant leak.     His Past Medical History Is Significant For: Past Medical History:  Diagnosis Date  . Allergy   . Carpal tunnel syndrome   . Complication of anesthesia 04-23-2013   pt told by anesthesia after probably has sleep apnea  . Complication of anesthesia    cannot lie on left side due to recent shoulder surgery per pt  . GERD (gastroesophageal reflux disease)   . OSA on CPAP 12/08/2013  . Snoring     His Past Surgical History Is Significant For: Past Surgical History:  Procedure Laterality Date  . ANAL FISSURE REPAIR  july 2009 and yrs ago   x 2   . CHOLECYSTECTOMY  1999  . COLONOSCOPY WITH PROPOFOL N/A 07/22/2013   Procedure: COLONOSCOPY WITH PROPOFOL;  Surgeon: Cleotis Nipper, MD;  Location: [facility];  Service: Endoscopy;  Laterality: N/A;  . ESOPHAGOGASTRODUODENOSCOPY (EGD) WITH PROPOFOL N/A 07/22/2013   Procedure: ESOPHAGOGASTRODUODENOSCOPY (EGD) WITH PROPOFOL;  Surgeon: Cleotis Nipper, MD;  Location: [facility];  Service: Endoscopy;  Laterality: N/A;  . EYE SURGERY Bilateral 2005   lasix  . FRACTURE SURGERY Right    foot   . GALLBLADDER SURGERY    . SHOULDER SURGERY Left sept 2014   cartlidge repair    His Family History Is Significant For: Family History  Problem Relation Age of  Onset  . Cancer Mother 31       colon, stomach  . Cancer Father        testicular  . Cancer Maternal Grandmother        Colon and Breast cancer  . Cancer Maternal Grandfather        Colon cancer  . Cancer Maternal Uncle        colon  . Cancer Maternal Uncle        colon    His Social History Is Significant For: Social History   Socioeconomic History  . Marital status: Significant Other    Spouse name: Juliann Pulse  . Number of children: 0  . Years of education: 41  . Highest education level: Not on file  Occupational History  . Occupation: Glass blower/designer    Employer: [employer]  . Financial resource strain: Not on file  . Food insecurity:    Worry: Not on file    Inability: Not on file  . Transportation needs:    Medical: Not on file    Non-medical: Not on file  Tobacco Use  . Smoking status: Never Smoker  . Smokeless tobacco: Never Used  Substance and Sexual Activity  . Alcohol use: Yes    Alcohol/week: 0.0 - 1.0 standard drinks    Comment: rare  . Drug use: No  . Sexual activity: Yes    Partners: Female    Birth control/protection: Condom    Comment: 1 sexual partner in last year  Lifestyle  . Physical activity:    Days per week: Not on file    Minutes per session: Not on file  . Stress: Not on file  Relationships  . Social connections:    Talks on phone: Not on file    Gets together: Not on file    Attends religious service: Not on file    Active member of club or organization: Not on file    Attends meetings of clubs or organizations: Not on file    Relationship status: Not on file  Other Topics Concern  . Not on file  Social History Narrative   Lives with his significant other, Juliann Pulse, and their cats.    His Allergies Are:  No Known Allergies:   His Current  Medications Are:  Outpatient Encounter Medications as of 07/16/2018  Medication Sig  . fluticasone (FLONASE) 50 MCG/ACT nasal spray Place 2 sprays into both nostrils daily.  . Polyethylene Glycol 3350 (MIRALAX PO) Take by mouth daily.   No facility-administered encounter medications on file as of 07/16/2018.   :  Review of Systems:  Out of a complete 14 point review of systems, all are reviewed and negative with the exception of these symptoms as listed below:  Review of Systems  Neurological:       Pt presents today to discuss getting his new cpap. Pt reports that he changed insurances in October. Pt does not think that he needs a new sleep study. Pt just needs documentation on why he needs a new cpap. Pt's current cpap is over 37 years old and he reports that he makes strange noises.    Objective:  Neurological Exam  Physical Exam Physical Examination:   Vitals:   07/16/18 1504  BP: 131/77  Pulse: 70   General Examination: The patient is a very pleasant 52 y.o. male in no acute distress. He appears well-developed and well-nourished and well groomed. Good spirits.   HEENT:No obvious changes. Hearing is grossly  intact. Face is symmetric with normal facial animation and normal facial sensation. Speech is clear with no dysarthria noted. There is no hypophonia. There is no lip, neck/head, jaw or voice tremor. Neck shows FROM. Oropharynx exam reveals: no obvious change.   Chest:Clear to auscultation without wheezing, rhonchi or crackles noted.  Heart:S1+S2+0, regular and normal without murmurs, rubs or gallops noted.   Abdomen:Soft, non-tender and non-distended.  Extremities:There is no pitting edema in the distal lower extremities bilaterally.   Skin: Warm and dry without trophic changes noted.   Musculoskeletal: exam reveals no obvious joint deformities, tenderness or joint swelling or erythema.   Neurologically:  Mental status: The patient is awake, alert and  oriented in all 4 spheres. His memory, attention, language and knowledge are appropriate. There is no aphasia, agnosia, apraxia or anomia. Speech is clear with normal prosody and enunciation. Thought process is linear. Mood is congruent and affect is normal.  Cranial nerves are as described above under HEENT exam.  Motor exam: Normal bulk, strength and tone is noted. There is no tremor. Fine motor skills are intactgrossly.  Cerebellar testing shows no dysmetria or intention tremor. There is no truncal or gait ataxia.  Sensory exam is intact to light touch in the upper and lower extremities.  Gait, station and balance are unremarkable. No veering to one side is noted. No leaning to one side is noted. Posture is age-appropriate and stance is narrow based. No problems turning are noted.  Assessment and Plan:   In summary, Jacob House is a very pleasant 52 year old male with an underlying medical history of carpal tunnel syndrome, arthritis and obesity, who presents for followup consultation of his obstructive sleep apnea, welltreated with CPAP of 15 cm with ongoing superb compliance and ongoing good results. He is fully compliant with treatment, he has been compliant for the past years. Of note, he had a split-night sleep study in March 2015.He is trying to lose weight. He is commended for his treatment adherence with CPAP. He Has had technical difficulties with his current CPAP machine which is over 24 years old. It is picking louder noises. He has had it looked at by his DME company and he was encouraged to get a new machine. 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 I provided a prescription for a new CPAP machine. About 2+years ago,we also did a pulse oximetry test and it was okay,well enoughmaintained oxygen saturations overnight while on CPAP. He did not require supplemental oxygen and had resolution of his severe oxygen  desaturations with CPAP treatment at the current level. He has been using nasal pillows. He is keeping up with his supplies through [facility]. He is advised to keep his appointment coming up next year routinely, he may need a 90 day follow-up, if required by insurance, we can certainly make that happen as well. He has benefited from CPAP therapy and has been compliant. His physical exam and neurological exam otherwise nonfocal. He is encouraged tocontinue to try tolose weight.I suggested a one-year checkup. I answered all his questions today and he was in agreement. I spent 15 minutes in total face-to-face time with the patient, more than 50% of which was spent in counseling and coordination of care, reviewing test results, reviewing medication and discussing or reviewing the diagnosis of OSA, its prognosis and treatment options. Pertinent laboratory and imaging test results that were available during this visit with the patient were reviewed  by me and considered in my medical decision making (see chart for details).

## 2018-07-16 NOTE — Progress Notes (Signed)
Order for new cpap  sent to [facility] via community message in [place]. Confirmation received that the order transmitted was successful.

## 2018-08-07 DIAGNOSIS — G4733 Obstructive sleep apnea (adult) (pediatric): Secondary | ICD-10-CM | POA: Diagnosis not present

## 2018-09-07 DIAGNOSIS — G4733 Obstructive sleep apnea (adult) (pediatric): Secondary | ICD-10-CM | POA: Diagnosis not present

## 2018-10-06 DIAGNOSIS — G4733 Obstructive sleep apnea (adult) (pediatric): Secondary | ICD-10-CM | POA: Diagnosis not present

## 2018-12-03 ENCOUNTER — Telehealth: Payer: Self-pay | Admitting: Neurology

## 2018-12-03 NOTE — Telephone Encounter (Signed)
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 [name] stating that he is  in compliance using the cpap. Please advise.

## 2018-12-03 NOTE — Telephone Encounter (Signed)
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 Pt is agreeable to a VV but wants to discuss it closer to his appt in May. We will touch base again a week prior to his appt.

## 2018-12-16 ENCOUNTER — Telehealth: Payer: Self-pay | Admitting: Neurology

## 2018-12-16 NOTE — Telephone Encounter (Signed)
Due to current COVID 19 pandemic, our office is severely reducing in office visits until further notice, in order to minimize the risk to our patients and healthcare providers.   Called patient to offer virtual visit for 5/18 appt. Patient accepted and  verbalized understanding of the doxy process. I have sent patient an e-mail with link and instructions, as well as my name and office number/hours. Patient understands that she will receive a call from RN prior to appt.  Pt understands that although there may be some limitations with this type of visit, we will take all precautions to reduce any security or privacy concerns.  Pt understands that this will be treated like an in office visit and we will file with pt's insurance, and there may be a patient responsible charge related to this service.

## 2018-12-17 NOTE — Telephone Encounter (Signed)
I called pt. Pt's meds, allergies, and PMH were updated.  Pt needs a year's worth of cpap data for the [facility] physical. He will bring his old cpap chip this PM for download.  Pt's new cpap chip is going well.  Pt confirmed that he did receive the [email or web address] link in his email and understands that process.

## 2018-12-21 ENCOUNTER — Ambulatory Visit (INDEPENDENT_AMBULATORY_CARE_PROVIDER_SITE_OTHER): Payer: BLUE CROSS/BLUE SHIELD | Admitting: Neurology

## 2018-12-21 ENCOUNTER — Other Ambulatory Visit: Payer: Self-pay

## 2018-12-21 ENCOUNTER — Encounter: Payer: Self-pay | Admitting: Neurology

## 2018-12-21 DIAGNOSIS — G4733 Obstructive sleep apnea (adult) (pediatric): Secondary | ICD-10-CM

## 2018-12-21 DIAGNOSIS — Z9989 Dependence on other enabling machines and devices: Secondary | ICD-10-CM

## 2018-12-21 NOTE — Patient Instructions (Signed)
Given verbally, during today's virtual video-based encounter, with verbal feedback received.   

## 2018-12-21 NOTE — Progress Notes (Signed)
Interim history:  Mr. Mccabe is a 53 year old right-handed gentleman with an underlying medical history of carpal tunnel syndrome, shoulder pain, and obesity, who presents for a virtual, video based appointment via [email or web address] for followup consultation of his obstructive sleep apnea, treated with CPAP, after starting with a new machine. He is unaccompanied today and joins via computer from home, I am located in my office.  I last saw him on 07/16/2018, at which time he was compliant with his CPAP and presented for a visit to qualify for his new CPAP machine.  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 He did notice some odd noises coming from his CPAP machine and it was over 59 years old.    Today, 12/21/2018: Please also see below for virtual visit documentation.    I reviewed his CPAP compliance data from 11/17/2018 through 12/16/2018 which is a total of 30 days, during which time he used his machine every night with percent used days greater than 4 hours at 100%, indicating superb compliance with an average usage of 7 hours and 1 minute, residual AHI at goal at 1.9/h, leak on the high side consistently with a 95th percentile at 43.6 L/min on a pressure of 15 cm with EPR of 2. I also reviewed his compliance data from 08/09/2017 through 08/08/2018 which is a total of 1 year, during which time his percent used days greater than 4 hours is 98.1% altogether with an average AHI at 2.5/h, pressure at 15 cm so altogether he has been compliant without any lapses of treatment for the past year which is exemplary. Set up date on the new machine was 08/07/2018.  The patient's allergies, current medications, family history, past medical history, past social history, past surgical history and problem list were reviewed and updated as appropriate.    Previously (copied from previous notes for reference):       I saw him on 12/17/2017, at which time he was fully compliant with  his previous CPAP machine. He was advised to follow-up routinely in one year.   He called in the interim reporting issues and abnormal sounds with his CPAP machine and he was eligible for a new CPAP machine.   I reviewed his CPAP compliance data from 06/16/2018 through 07/15/2018 which is a total of 30 days, during which time he used his CPAP machine every night with percent used days greater than 4 hours at 100%, indicating superb compliance with an average usage of 6 hours and 55 minutes, residual AHI at goal at 2.5 per hour, pressure at 15, leak on the lower end.   I saw him on 01/21/2017, at which time he was fully compliant with his CPAP. I gave him a prescription for travel CPAP machine. 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 I suggested a one-year checkup.   He requested a replacement prescription for his travel CPAP in the interim, which we provided, but he could not fill it until he has a face-to-face visit as I understand.   I reviewed his CPAP compliance data from the past 30 days from 11/17/2017 through 12/16/2017, during which time he used his CPAP every night with percent used days greater than 4 hours at 100%, indicating superb compliance with an average usage of 6 hours and 50 minutes, residual AHI at goal at 2.6 per hour, leak fluctuates, some days better than others, average leakage is  acceptable. We also reviewed his compliance data for the last year, he missed 1 day and has been fully compliant, apnea score at goal and leak on the low side.    I saw him on 11/28/2015, at which time he was doing well, he was fully compliant with his CPAP. I received his compliance data after the visit via fax from his DME company and he was under percent compliant at the time, he was trying to lose weight. 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   I reviewed his CPAP compliance data  from 12/22/2016 through 01/20/2017 which is a total of 30 days, during which time he used his machine every night with percent used days greater than 4 hours at 100%, average usage of 6 hours and 48 minutes, residual AHI 2.4 per hour, leak low generally speaking, pressure of 15 cm.    I saw him on 11/17/2014 at which time he reported full compliance with CPAP. I reviewed his pulse ox report which indicated that he was appropriately treated with CPAP only and did not need supplemental oxygen thankfully. He changed from a nasal mask to nasal pillows interface in February 2016 and like to it. He had some issues with low back pain.    I reviewed his CPAP compliance data from 10/29/15 to 11/27/15, which is a total of 30 days, during which time He used his machine 29 days with percent used days greater than 4 hours at 96.7%, indicating excellent compliance with an average usage of 6 hours and 16 minutes, residual AHI 3.4 per hour, pressure at 15 cm, leak low.    Addendum 11/17/2014: Later in the afternoon I received his CPAP compliance data from his DME company via fax. This was from 08/24/2014 through 09/22/2014 which is a total of 30 days during which time he used his machine every night with percent used days greater than 4 hours at 100%, indicating superb compliance with an average usage of 6 hours and 35 minutes. Residual AHI acceptable at 3.9 per hour on a pressure of 15 cm and leak low. This indicates superb compliance and of course supports his verbal report of full compliance.    I saw him on 05/09/2014, at which time he reported compliance with treatment. He had some residual daytime somnolence and some issues with mask fitting. He was using a [place] on nasal mask. He was still not always fully rested in the mornings. He was given a prescription for Voltaren by his orthopedic doctor for his arthritis. He was trying to lose weight and had in fact lost some weight. I asked him to meet with his DME provider for  mask refit. I also suggested we proceed with an overnight pulse oximetry test to ensure that his oxygen saturations were fine on treatment.   I reviewed the patient's overnight pulse oximetry report from 09/23/2014 while on CPAP therapy at night. His average oxygen saturation was 95.2%, total test time was 6 hours and 11 minutes. Lowest oxygen saturation was 86%. Time below 88% saturation was 28 seconds. Based on these test results, it appears that the patient is appropriately treated on room air with CPAP only and does not require additional supplemental oxygen at this time. Average pulse rate was 60.2 bpm, maximum was 99 bpm, minimum was 46 bpm during the testing time.    I reviewed his compliance data from 04/10/14 to 05/08/14, which is 30 days, during which  time he was 100%, percent used days greater than 4 hours was 90%, indicating excellent compliance. Residual AHI was 3.1 per hour. Leak was at times quite high.    I saw him on 12/08/2013, at which time I advised him regarding his sleep study results and his compliance. He indicated using his CPAP regularly. There was no data on his SD card. His [employer] CPAP machine indicated that he had been using it every day for the previous 30 days. He reported sleeping well with CPAP and having adjusted fairly well to it. His sleepiness was improved. I asked him to continue using CPAP regularly.   I reviewed the patient's CPAP compliance data from 11/09/2013 to 12/08/2013, which is a total of 30 days, during which time the patient used CPAP every day. The average usage for all days was 6 hours and 39 minutes. The percent used days greater than 4 hours was 100 %, indicating superb compliance. The residual AHI was 5.6 per hour, indicating an adequate treatment pressure of 15 cwp. Air leak from the mask was low.   I first met him on 08/31/2013, at which time he reported snoring and excessive daytime somnolence as well as witnessed apneas. I requested he  return for sleep study. He had a split-night sleep study on 10/06/2013: Baseline sleep efficiency was significantly reduced at 45.5% with a long sleep latency of 116.5 minutes and wake after sleep onset of 34 minutes with mild sleep fragmentation noted. He had an elevated arousal index. He had markedly increased percentage of stage II sleep, near absence of slow-wave sleep, and absence of REM sleep prior to CPAP. He had no clinically significant periodic leg movements or EKG changes. He had moderate to loud snoring. His total AHI was 57.8 per hour. Baseline oxygen saturation was 91%, nadir was 67%. He was then titrated on CPAP with sleep efficiency improved at 73.3% with a latency to sleep of 50.5 minutes and wake after sleep onset of 3.5 minutes with mild sleep fragmentation noted. His arousal index was normal. He had a increased percentage of stage II sleep, near absence of slow-wave sleep and an increased percentage of REM sleep at 34.4%. Average oxygen saturation was improved at 93%, nadir was 70% which was during the initial titration period. On the final pressure of 14 cm his O2 nadir was 91% but he did not achieve REM sleep on the final pressure. He was titrated from 4-14 cm with reduction of the AHI of 6.2 events per hour at the final pressure. Based on the test results I did start the patient on CPAP at a pressure of 15 cm as he had a residual AHI of over 5/h on the final pressure of 14 cm during the study.     I reviewed his compliance data from 10/26/2013 through 11/07/2013 which is a total of 13 days during which time he was CPAP every night. Percent used days greater than 4 hours was 100%, average usage was 7 hours and 17 minutes. Residual AHI was 8 per hour on a pressure of 15 cm with no significant leak.  His Past Medical History Is Significant For: Past Medical History:  Diagnosis Date   Allergy    Carpal tunnel syndrome    Complication of anesthesia 04-23-2013   pt told by anesthesia  after probably has sleep apnea   Complication of anesthesia    cannot lie on left side due to recent shoulder surgery per pt   GERD (gastroesophageal reflux disease)  OSA on CPAP 12/08/2013   Snoring     His Past Surgical History Is Significant For: Past Surgical History:  Procedure Laterality Date   ANAL FISSURE REPAIR  july 2009 and yrs ago   x 2    CHOLECYSTECTOMY  1999   COLONOSCOPY WITH PROPOFOL N/A 07/22/2013   Procedure: COLONOSCOPY WITH PROPOFOL;  Surgeon: Cleotis Nipper, MD;  Location: [facility];  Service: Endoscopy;  Laterality: N/A;   ESOPHAGOGASTRODUODENOSCOPY (EGD) WITH PROPOFOL N/A 07/22/2013   Procedure: ESOPHAGOGASTRODUODENOSCOPY (EGD) WITH PROPOFOL;  Surgeon: Cleotis Nipper, MD;  Location: [facility];  Service: Endoscopy;  Laterality: N/A;   EYE SURGERY Bilateral 2005   lasix   FRACTURE SURGERY Right    foot   GALLBLADDER SURGERY     SHOULDER SURGERY Left sept 2014   cartlidge repair    His Family History Is Significant For: Family History  Problem Relation Age of Onset   Cancer Mother 67       colon, stomach   Cancer Father        testicular   Cancer Maternal Grandmother        Colon and Breast cancer   Cancer Maternal Grandfather        Colon cancer   Cancer Maternal Uncle        colon   Cancer Maternal Uncle        colon    His Social History Is Significant For: Social History   Socioeconomic History   Marital status: Significant Other    Spouse name: Juliann Pulse   Number of children: 0   Years of education: 16   Highest education level: Not on file  Occupational History   Occupation: IT sales professional: [employer]  Social Designer, fashion/clothing strain: Not on file   Food insecurity:    Worry: Not on file    Inability: Not on file   Transportation needs:    Medical: Not on file    Non-medical: Not on file  Tobacco Use   Smoking status: Never Smoker   Smokeless tobacco: Never Used    Substance and Sexual Activity   Alcohol use: Yes    Alcohol/week: 0.0 - 1.0 standard drinks    Comment: rare   Drug use: No   Sexual activity: Yes    Partners: Female    Birth control/protection: Condom    Comment: 1 sexual partner in last year  Lifestyle   Physical activity:    Days per week: Not on file    Minutes per session: Not on file   Stress: Not on file  Relationships   Social connections:    Talks on phone: Not on file    Gets together: Not on file    Attends religious service: Not on file    Active member of club or organization: Not on file    Attends meetings of clubs or organizations: Not on file    Relationship status: Not on file  Other Topics Concern   Not on file  Social History Narrative   Lives with his significant other, Juliann Pulse, and their cats.    His Allergies Are:  No Known Allergies:   His Current Medications Are:  Outpatient Encounter Medications as of 12/21/2018  Medication Sig   fluticasone (FLONASE) 50 MCG/ACT nasal spray Place 2 sprays into both nostrils daily.   Polyethylene Glycol 3350 (MIRALAX PO) Take by mouth daily.   No facility-administered encounter medications on file as of 12/21/2018.   :  Review of Systems:  Out of a complete 14 point review of systems, all are reviewed and negative with the exception of these symptoms as listed below:  Virtual Visit via Video Note on 12/21/2018:  I connected with [facility] on 12/21/18 at  3:00 PM EDT by a video enabled telemedicine application and verified that I am speaking with the correct person using two identifiers.   I discussed the limitations of evaluation and management by telemedicine and the availability of in person appointments. The patient expressed understanding and agreed to proceed.  History of Present Illness:  He reports Doing well with his new machine, has noted some more mouth dryness and sometimes his headgear is loose.  His leakage has indeed increased from the  mask, he continues to use the same style and size of nasal pillows that he had before.  His weight has crept up a little bit since he has been more sedentary since the COVID-19 pandemic.  Also he is still working on his commercial pilot's license but things are a little slower currently. He continues to work at [employer].     Observations/Objective: The most recent available vital signs in his chart for review are from December 2019. On examination, he is very pleasant and conversant, in no acute distress, Comprehension and language skills good, speech is clear without dysarthria, hypophonia or voice tremor noted.  Face is symmetric with normal facial animation noted, extraocular movements well preserved in all directions without nystagmus or gaze limitation noted.  Hearing is grossly intact.  Shoulder height is equal.  Motor examination reveals normal-appearing bulk in the upper body, coordination unremarkable grossly. Romberg is negative. He stands and walks without difficulty.  Assessment and Plan: In summary, Dempsey Ahonen is a very pleasant 53 year old male with an underlying medical history of carpal tunnel syndrome, arthritis and obesity, who presents forA virtual, video based follow-up appointment for his obstructive sleep apnea after starting a new CPAP machine in January 2020.  He has ongoing full compliance and is commended for this.  I reviewed compliance data for the past year.  He continues to have full compliance.  His leak from the mask has gone up since he started the new machine, it could be that his headgear is a little looser now than before.  He has tried in the past a chinstrap which he found uncomfortable.He does endorse a little bit of mouth dryness.  He is advised to seek a possible mask refit with his DME provider, [facility].  In addition, he is advised to tighten the headgear just a little bit on his own. He has been compliant for the past years.Of note, he had a split-night sleep  study in March 2015.He is trying to lose weight, But may have gained a little bit since the COVID-19 pandemic. He is commended for his treatment adherence with CPAP. 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 About 2+years ago,we also did a pulse oximetry test and it was okay,well enoughmaintained oxygen saturations overnight whileon CPAP. He did not require supplemental oxygen and had resolution of his severe oxygen desaturations with CPAP treatment at the current level. He has been using nasal pillows.He is keeping up with his supplies through [place]. He is advised to follow up in one year. He has benefited from CPAP therapy and has been compliant. 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 I answered all his questions today and he was in agreement.  Follow Up Instructions:    I discussed the assessment and treatment plan with the patient. The patient was provided an opportunity to ask questions and all were answered. The patient agreed with the plan and demonstrated an understanding of the instructions.   The patient was advised to call back or seek an in-person evaluation if the symptoms worsen or if the condition fails to improve as anticipated.  I provided 15 minutes of non-face-to-face time during this encounter.   Star Age, MD

## 2018-12-24 ENCOUNTER — Telehealth: Payer: Self-pay

## 2018-12-24 NOTE — Telephone Encounter (Signed)
I called pt, scheduled him for his yearly f/u. Pt verbalized understanding of new appt date and time.

## 2019-02-05 DIAGNOSIS — G4733 Obstructive sleep apnea (adult) (pediatric): Secondary | ICD-10-CM | POA: Diagnosis not present

## 2019-03-08 DIAGNOSIS — G4733 Obstructive sleep apnea (adult) (pediatric): Secondary | ICD-10-CM | POA: Diagnosis not present

## 2019-03-31 ENCOUNTER — Other Ambulatory Visit: Payer: Self-pay | Admitting: *Deleted

## 2019-03-31 DIAGNOSIS — J09X2 Influenza due to identified novel influenza A virus with other respiratory manifestations: Secondary | ICD-10-CM | POA: Diagnosis not present

## 2019-04-01 LAB — NOVEL CORONAVIRUS, NAA: SARS-CoV-2, NAA: NOT DETECTED

## 2019-04-01 LAB — SPECIMEN STATUS REPORT

## 2019-04-07 ENCOUNTER — Emergency Department (HOSPITAL_COMMUNITY)
Admission: EM | Admit: 2019-04-07 | Discharge: 2019-04-08 | Disposition: A | Discharge status: Home / Self Care | Payer: BLUE CROSS/BLUE SHIELD | Attending: Emergency Medicine | Admitting: Emergency Medicine

## 2019-04-07 ENCOUNTER — Encounter (HOSPITAL_COMMUNITY): Payer: Self-pay

## 2019-04-07 ENCOUNTER — Other Ambulatory Visit: Payer: Self-pay

## 2019-04-07 DIAGNOSIS — F419 Anxiety disorder, unspecified: Secondary | ICD-10-CM | POA: Insufficient documentation

## 2019-04-07 DIAGNOSIS — Z5321 Procedure and treatment not carried out due to patient leaving prior to being seen by health care provider: Secondary | ICD-10-CM | POA: Diagnosis not present

## 2019-04-07 NOTE — ED Triage Notes (Signed)
Onset 9pm left fingertips tingling.  No chest pain.  No c/c, fever, N/V/D, sore throat.

## 2019-04-08 ENCOUNTER — Ambulatory Visit (INDEPENDENT_AMBULATORY_CARE_PROVIDER_SITE_OTHER)
Admission: EM | Admit: 2019-04-08 | Discharge: 2019-04-08 | Disposition: A | Discharge status: Home / Self Care | Payer: BLUE CROSS/BLUE SHIELD | Source: Home / Self Care

## 2019-04-08 ENCOUNTER — Other Ambulatory Visit: Payer: Self-pay

## 2019-04-08 ENCOUNTER — Encounter (HOSPITAL_COMMUNITY): Payer: Self-pay

## 2019-04-08 DIAGNOSIS — R0789 Other chest pain: Secondary | ICD-10-CM | POA: Diagnosis not present

## 2019-04-08 DIAGNOSIS — G4733 Obstructive sleep apnea (adult) (pediatric): Secondary | ICD-10-CM | POA: Diagnosis not present

## 2019-04-08 DIAGNOSIS — F4329 Adjustment disorder with other symptoms: Secondary | ICD-10-CM

## 2019-04-08 DIAGNOSIS — M79602 Pain in left arm: Secondary | ICD-10-CM

## 2019-04-08 LAB — CBC WITH DIFFERENTIAL/PLATELET
Abs Immature Granulocytes: 0.02 10*3/uL (ref 0.00–0.07)
Basophils Absolute: 0 10*3/uL (ref 0.0–0.1)
Basophils Relative: 1 %
Eosinophils Absolute: 0.3 10*3/uL (ref 0.0–0.5)
Eosinophils Relative: 5 %
HCT: 42.4 % (ref 39.0–52.0)
Hemoglobin: 13.7 g/dL (ref 13.0–17.0)
Immature Granulocytes: 0 %
Lymphocytes Relative: 30 %
Lymphs Abs: 1.8 10*3/uL (ref 0.7–4.0)
MCH: 28.7 pg (ref 26.0–34.0)
MCHC: 32.3 g/dL (ref 30.0–36.0)
MCV: 88.7 fL (ref 80.0–100.0)
Monocytes Absolute: 0.7 10*3/uL (ref 0.1–1.0)
Monocytes Relative: 12 %
Neutro Abs: 3.1 10*3/uL (ref 1.7–7.7)
Neutrophils Relative %: 52 %
Platelets: 276 10*3/uL (ref 150–400)
RBC: 4.78 MIL/uL (ref 4.22–5.81)
RDW: 12.4 % (ref 11.5–15.5)
WBC: 5.9 10*3/uL (ref 4.0–10.5)
nRBC: 0 % (ref 0.0–0.2)

## 2019-04-08 LAB — BASIC METABOLIC PANEL
Anion gap: 9 (ref 5–15)
BUN: 15 mg/dL (ref 6–20)
CO2: 27 mmol/L (ref 22–32)
Calcium: 8.9 mg/dL (ref 8.9–10.3)
Chloride: 103 mmol/L (ref 98–111)
Creatinine, Ser: 1.11 mg/dL (ref 0.61–1.24)
GFR calc Af Amer: >60 mL/min (ref >60)
GFR calc non Af Amer: >60 mL/min (ref >60)
Glucose, Bld: 115 mg/dL — ABNORMAL HIGH (ref 70–99)
Potassium: 4.4 mmol/L (ref 3.5–5.1)
Sodium: 139 mmol/L (ref 135–145)

## 2019-04-08 MED ORDER — HYDROXYZINE HCL 25 MG PO TABS
12.5000 mg | ORAL_TABLET | Freq: Three times a day (TID) | ORAL | 0 refills | Status: DC | PRN
Start: 2019-04-08 — End: 2019-12-29

## 2019-04-08 MED ORDER — OMEPRAZOLE 20 MG PO CPDR: 20.0000 mg | DELAYED_RELEASE_CAPSULE | Freq: Every day | ORAL | 0 refills | Status: DC

## 2019-04-08 NOTE — Discharge Instructions (Signed)
Please make sure you follow-up with your PCP and see about getting a referral for a consult with a cardiologist.  In the meantime if your chest pain returns, worsens then please report back to the emergency room as you may be having a heart event such as a heart attack.  Otherwise I will have you start Prilosec for your acid reflux and hydroxyzine to help with the stress that you are going through, can also help you get some rest at bedtime.

## 2019-04-08 NOTE — ED Provider Notes (Signed)
MRN: [id number] DOB: [birth year]  Subjective:   Jacob House is a 53 y.o. male presenting for 2 to 3-day history of progressive persistent malaise and upper chest pain throughout.  States that the chest pain is currently mild but was moderate last night, prompting him to go to the ER.  Unfortunately patient left before he could be seen.  He did have some labs done.  Has tried aspirin  Had negative COVID test 03/31/2019. Has a hx of OSA, uses CPAP reliably. Denies smoking cigarettes. Of note, patient is under a lot stress. 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 He's had to obtain/start different jobs, currently working at both [employer] and [employer]. He is very stressed about this, also [place] but quit shortly after starting. Has a good relationship with his gf. Has no kids. No hx of heart disease, MI. Last episode similar to this was 2014.    No current facility-administered medications for this encounter.   Current Outpatient Medications:  .  fluticasone (FLONASE) 50 MCG/ACT nasal spray, Place 2 sprays into both nostrils daily., Disp: , Rfl:  .  Polyethylene Glycol 3350 (MIRALAX PO), Take by mouth daily., Disp: , Rfl:     No Known Allergies   Past Medical History:  Diagnosis Date  . Allergy   . Carpal tunnel syndrome   . Complication of anesthesia 04-23-2013   pt told by anesthesia after probably has sleep apnea  . Complication of anesthesia    cannot lie on left side due to recent shoulder surgery per pt  . GERD (gastroesophageal reflux disease)   . OSA on CPAP 12/08/2013  . Snoring      Past Surgical History:  Procedure Laterality Date  . ANAL FISSURE REPAIR  july 2009 and yrs ago   x 2   . CHOLECYSTECTOMY  1999  . COLONOSCOPY WITH PROPOFOL N/A 07/22/2013   Procedure: COLONOSCOPY WITH PROPOFOL;  Surgeon: Cleotis Nipper, MD;  Location: [facility];  Service: Endoscopy;  Laterality: N/A;   . ESOPHAGOGASTRODUODENOSCOPY (EGD) WITH PROPOFOL N/A 07/22/2013   Procedure: ESOPHAGOGASTRODUODENOSCOPY (EGD) WITH PROPOFOL;  Surgeon: Cleotis Nipper, MD;  Location: [facility];  Service: Endoscopy;  Laterality: N/A;  . EYE SURGERY Bilateral 2005   lasix  . FRACTURE SURGERY Right    foot  . GALLBLADDER SURGERY    . SHOULDER SURGERY Left sept 2014   cartlidge repair    Review of Systems  Constitutional: Negative for fever and malaise/fatigue.  HENT: Negative for congestion, ear pain, sinus pain and sore throat.   Eyes: Negative for blurred vision, double vision, discharge and redness.  Respiratory: Negative for cough, hemoptysis, shortness of breath and wheezing.   Cardiovascular: Positive for chest pain.  Gastrointestinal: Negative for abdominal pain, diarrhea, nausea and vomiting.  Genitourinary: Negative for dysuria, flank pain and hematuria.  Musculoskeletal: Positive for myalgias (left arm pain with some tingling intermittently at forearm to the fingertips).  Skin: Negative for rash.  Neurological: Negative for dizziness, weakness and headaches.  Psychiatric/Behavioral: Negative for depression and substance abuse.    Family History  Problem Relation Age of Onset  . Cancer Mother 1       colon, stomach  . Cancer Father        testicular  . Cancer Maternal Grandmother        Colon and Breast cancer  . Cancer Maternal Grandfather  Colon cancer  . Cancer Maternal Uncle        colon  . Cancer Maternal Uncle        colon    Social History   Tobacco Use  . Smoking status: Never Smoker  . Smokeless tobacco: Never Used  Substance Use Topics  . Alcohol use: Yes    Alcohol/week: 0.0 - 1.0 standard drinks    Comment: rare  . Drug use: No    Objective:   Vitals: BP 127/75 (BP Location: Right Arm)   Pulse 68   Temp 98.4 F (36.9 C) (Temporal)   Resp 16   Wt 235 lb (106.6 kg)   SpO2 99%   BMI 37.93 kg/m    Physical Exam Constitutional:       General: He is not in acute distress.    Appearance: Normal appearance. He is well-developed. He is not ill-appearing, toxic-appearing or diaphoretic.  HENT:     Head: Normocephalic and atraumatic.     Right Ear: External ear normal.     Left Ear: External ear normal.     Nose: Nose normal.     Mouth/Throat:     Mouth: Mucous membranes are moist.     Pharynx: Oropharynx is clear.  Eyes:     General: No scleral icterus.    Extraocular Movements: Extraocular movements intact.     Pupils: Pupils are equal, round, and reactive to light.  Neck:     Musculoskeletal: Normal range of motion and neck supple.     Vascular: No carotid bruit.  Cardiovascular:     Rate and Rhythm: Normal rate and regular rhythm.     Heart sounds: Normal heart sounds. No murmur. No friction rub. No gallop.   Pulmonary:     Effort: Pulmonary effort is normal. No respiratory distress.     Breath sounds: Normal breath sounds. No stridor. No wheezing, rhonchi or rales.  Abdominal:     General: Bowel sounds are normal. There is no distension.     Palpations: Abdomen is soft. There is no mass.     Tenderness: There is no abdominal tenderness. There is no guarding or rebound.  Musculoskeletal: Normal range of motion.     Right lower leg: No edema.     Left lower leg: No edema.  Skin:    General: Skin is warm and dry.  Neurological:     Mental Status: He is alert and oriented to person, place, and time.     Cranial Nerves: No cranial nerve deficit.     Motor: No weakness.     Coordination: Coordination normal.     Deep Tendon Reflexes: Reflexes normal.  Psychiatric:        Mood and Affect: Mood normal.        Behavior: Behavior normal.        Thought Content: Thought content normal.        Judgment: Judgment normal.     Results for orders placed or performed during the hospital encounter of 04/07/19 (from the past 24 hour(s))  CBC with Differential     Status: None   Collection Time: 04/08/19  1:05 AM   Result Value Ref Range   WBC 5.9 4.0 - 10.5 K/uL   RBC 4.78 4.22 - 5.81 MIL/uL   Hemoglobin 13.7 13.0 - 17.0 g/dL   HCT 42.4 39.0 - 52.0 %   MCV 88.7 80.0 - 100.0 fL   MCH 28.7 26.0 - 34.0 pg   MCHC 32.3  30.0 - 36.0 g/dL   RDW 12.4 11.5 - 15.5 %   Platelets 276 150 - 400 K/uL   nRBC 0.0 0.0 - 0.2 %   Neutrophils Relative % 52 %   Neutro Abs 3.1 1.7 - 7.7 K/uL   Lymphocytes Relative 30 %   Lymphs Abs 1.8 0.7 - 4.0 K/uL   Monocytes Relative 12 %   Monocytes Absolute 0.7 0.1 - 1.0 K/uL   Eosinophils Relative 5 %   Eosinophils Absolute 0.3 0.0 - 0.5 K/uL   Basophils Relative 1 %   Basophils Absolute 0.0 0.0 - 0.1 K/uL   Immature Granulocytes 0 %   Abs Immature Granulocytes 0.02 0.00 - 0.07 K/uL  Basic metabolic panel     Status: Abnormal   Collection Time: 04/08/19  1:05 AM  Result Value Ref Range   Sodium 139 135 - 145 mmol/L   Potassium 4.4 3.5 - 5.1 mmol/L   Chloride 103 98 - 111 mmol/L   CO2 27 22 - 32 mmol/L   Glucose, Bld 115 (H) 70 - 99 mg/dL   BUN 15 6 - 20 mg/dL   Creatinine, Ser 1.11 0.61 - 1.24 mg/dL   Calcium 8.9 8.9 - 10.3 mg/dL   GFR calc non Af Amer >60 >60 mL/min   GFR calc Af Amer >60 >60 mL/min   Anion gap 9 5 - 15    ED ECG REPORT   Date: 04/08/2019  Rate: 62bpm  Rhythm: normal sinus rhythm  QRS Axis: normal  Intervals: normal  ST/T Wave abnormalities: normal  Conduction Disutrbances:none  Narrative Interpretation: Possible Q wave in lead III but otherwise sinus rhythm at 62 bpm and almost identical to ECG from 09/29/2012.  Old EKG Reviewed: unchanged  I have personally reviewed the EKG tracing and agree with the computerized printout as noted.   Assessment and Plan :   1. Atypical chest pain   2. Stress and adjustment reaction   3. Left arm pain     Physical exam findings, vital signs and EKG very reassuring.  Reviewed labs that were done from the emergency room.  We will have patient start Prilosec for recurrent GERD.  We will also have  patient use hydroxyzine to address anxiety, insomnia.  Emphasized need for patient to do better self-care. Counseled patient on potential for adverse effects with medications prescribed/recommended today, ER and return-to-clinic precautions discussed, patient verbalized understanding.  Follow-up with PCP, obtain referral to cardiologist.    Jaynee Eagles, PA-C 04/08/19 1711

## 2019-04-08 NOTE — ED Triage Notes (Addendum)
Pt states he had some chest discomfort and today he feels much better. Pt went to the ER last night and they did blood work. He left before they saw him. Pt didn't want to wait.

## 2019-04-08 NOTE — ED Notes (Signed)
Called pt to take back to room no answer

## 2019-04-21 DIAGNOSIS — Z6838 Body mass index (BMI) 38.0-38.9, adult: Secondary | ICD-10-CM | POA: Diagnosis not present

## 2019-04-21 DIAGNOSIS — E7849 Other hyperlipidemia: Secondary | ICD-10-CM | POA: Diagnosis not present

## 2019-04-21 DIAGNOSIS — Z1389 Encounter for screening for other disorder: Secondary | ICD-10-CM | POA: Diagnosis not present

## 2019-04-21 DIAGNOSIS — R0789 Other chest pain: Secondary | ICD-10-CM | POA: Diagnosis not present

## 2019-04-21 DIAGNOSIS — Z23 Encounter for immunization: Secondary | ICD-10-CM | POA: Diagnosis not present

## 2019-05-28 ENCOUNTER — Other Ambulatory Visit: Payer: Self-pay

## 2019-05-28 DIAGNOSIS — Z20822 Contact with and (suspected) exposure to covid-19: Secondary | ICD-10-CM

## 2019-05-30 LAB — NOVEL CORONAVIRUS, NAA: SARS-CoV-2, NAA: NOT DETECTED

## 2019-08-03 ENCOUNTER — Ambulatory Visit: Payer: BLUE CROSS/BLUE SHIELD | Attending: Internal Medicine

## 2019-08-03 DIAGNOSIS — Z20828 Contact with and (suspected) exposure to other viral communicable diseases: Secondary | ICD-10-CM | POA: Diagnosis not present

## 2019-08-03 DIAGNOSIS — Z20822 Contact with and (suspected) exposure to covid-19: Secondary | ICD-10-CM

## 2019-08-04 LAB — NOVEL CORONAVIRUS, NAA: SARS-CoV-2, NAA: NOT DETECTED

## 2019-08-10 ENCOUNTER — Ambulatory Visit: Payer: BLUE CROSS/BLUE SHIELD | Attending: Internal Medicine

## 2019-08-10 DIAGNOSIS — Z20822 Contact with and (suspected) exposure to covid-19: Secondary | ICD-10-CM

## 2019-08-12 ENCOUNTER — Other Ambulatory Visit: Payer: BLUE CROSS/BLUE SHIELD

## 2019-08-12 LAB — NOVEL CORONAVIRUS, NAA: SARS-CoV-2, NAA: DETECTED — AB

## 2019-09-01 DIAGNOSIS — G4733 Obstructive sleep apnea (adult) (pediatric): Secondary | ICD-10-CM | POA: Diagnosis not present

## 2019-09-16 DIAGNOSIS — G4733 Obstructive sleep apnea (adult) (pediatric): Secondary | ICD-10-CM | POA: Diagnosis not present

## 2019-12-29 ENCOUNTER — Encounter: Payer: Self-pay | Admitting: Neurology

## 2019-12-29 ENCOUNTER — Other Ambulatory Visit: Payer: Self-pay

## 2019-12-29 ENCOUNTER — Ambulatory Visit: Payer: Self-pay | Admitting: Neurology

## 2019-12-29 VITALS — BP 123/72 | HR 69 | Ht 67.0 in | Wt 243.3 lb

## 2019-12-29 DIAGNOSIS — Z9989 Dependence on other enabling machines and devices: Secondary | ICD-10-CM

## 2019-12-29 DIAGNOSIS — G4733 Obstructive sleep apnea (adult) (pediatric): Secondary | ICD-10-CM

## 2019-12-29 NOTE — Patient Instructions (Signed)
Please continue using your CPAP regularly. While your insurance requires that you use CPAP at least 4 hours each night on 70% of the nights, I recommend, that you not skip any nights and use it throughout the night if you can. Getting used to CPAP and staying with the treatment long term does take time and patience and discipline. Untreated obstructive sleep apnea when it is moderate to severe can have an adverse impact on cardiovascular health and raise her risk for heart disease, arrhythmias, hypertension, congestive heart failure, stroke and diabetes. Untreated obstructive sleep apnea causes sleep disruption, nonrestorative sleep, and sleep deprivation. This can have an impact on your day to day functioning and cause daytime sleepiness and impairment of cognitive function, memory loss, mood disturbance, and problems focussing. Using CPAP regularly can improve these symptoms.  You continue to be fully compliant with your CPAP, keep up the good work, continue to work on weight loss.  I do believe it would help if you change your head strap, it may reduce your leak and improve your mouth dryness.

## 2019-12-29 NOTE — Progress Notes (Signed)
Subjective:    Patient ID: Jacob House is a 54 y.o. male.  HPI     Interim history:   Jacob House is a 54 year old right-handed gentleman with an underlying medical history of carpal tunnel syndrome, shoulder pain, and obesity, who presents for followup consultation of his obstructive sleep apnea, treated with CPAP. He is unaccompanied today and presents for his yearly check up. I last saw Jacob House on 12/21/2018 in a virtual visit, at which time he had started using a new CPAP machine and he was compliant with treatment.  He was advised to follow-up routinely in 1 year.  Today, 12/29/19:  I reviewed his CPAP compliance data from the past year, during which time he was fully compliant.  I also reviewed his most recent compliance data from 11/29/2019, through 12/28/2019, which is a total of 30 days, during which time he used his machine every night with percent use days greater than 4 hours at 100%, indicating superb compliance with an average usage of 6 hours and 56 minutes, residual AHI at goal at 2.8/h, leak on the high side consistently with a 95th percentile at 38.6 L/min on a pressure of 15 cm with EPR of 2.  He reports that his mouth tends to be dry, he noticed this with a new machine but also admits that he could change his headgear, he does have a new head strap.  He currently does not have insurance as he quit his job at [employer] in March, due to the extended work hours.  He was not able to work 7 nights a week.  He still works at [employer].  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  His girlfriend got sick before he did, he had a fever, some upper respiratory symptoms, never had to go to the hospital and lost his sense of taste and smell.  He still has some altered sense of smell but is getting better.  He is in the process of selling his house and they will be renting for a while.   He is now fully vaccinated for Covid.  The patient's allergies, current medications, family history, past medical history, past social history, past surgical history and problem list were reviewed and updated as appropriate.    Previously (copied from previous notes for reference):    I saw Jacob House on 07/16/2018, at which time he was compliant with his CPAP and presented for a visit to qualify for his new CPAP machine.  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 He did notice some odd noises coming from his CPAP machine and it was over 93 years old.   I reviewed his CPAP compliance data from 11/17/2018 through 12/16/2018 which is a total of 30 days, during which time he used his machine every night with percent used days greater than 4 hours at 100%, indicating superb compliance with an average usage of 7 hours and 1 minute, residual AHI at goal at 1.9/h, leak on the high side consistently with a 95th percentile at 43.6 L/min on a pressure of 15 cm with EPR of 2. I also reviewed his compliance data from 08/09/2017 through 08/08/2018 which is a total of 1 year, during which time his percent used days greater than 4 hours is 98.1% altogether with an average AHI at 2.5/h, pressure at  15 cm so altogether he has been compliant without any lapses of treatment for the past year which is exemplary. Set up date on the new machine was 08/07/2018.    I saw Jacob House on 12/17/2017, at which time he was fully compliant with his previous CPAP machine. He was advised to follow-up routinely in one year.    He called in the interim reporting issues and abnormal sounds with his CPAP machine and he was eligible for a new CPAP machine.    I reviewed his CPAP compliance data from 06/16/2018 through 07/15/2018 which is a total of 30 days, during which time he used his CPAP machine every night with percent used days greater than 4 hours at 100%, indicating superb compliance with an average  usage of 6 hours and 55 minutes, residual AHI at goal at 2.5 per hour, pressure at 15, leak on the lower end.    I saw Jacob House on 01/21/2017, at which time he was fully compliant with his CPAP. I gave Jacob House a prescription for travel CPAP machine. He reported doing well, he was working on his Merchandiser, retail and working on weight loss. I suggested a one-year checkup.   He requested a replacement prescription for his travel CPAP in the interim, which we provided, but he could not fill it until he has a face-to-face visit as I understand.   I reviewed his CPAP compliance data from the past 30 days from 11/17/2017 through 12/16/2017, during which time he used his CPAP every night with percent used days greater than 4 hours at 100%, indicating superb compliance with an average usage of 6 hours and 50 minutes, residual AHI at goal at 2.6 per hour, leak fluctuates, some days better than others, average leakage is acceptable. We also reviewed his compliance data for the last year, he missed 1 day and has been fully compliant, apnea score at goal and leak on the low side.    I saw Jacob House on 11/28/2015, at which time he was doing well, he was fully compliant with his CPAP. I received his compliance data after the visit via fax from his DME company and he was under percent compliant at the time, he was trying to lose weight. 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   I reviewed his CPAP compliance data from 12/22/2016 through 01/20/2017 which is a total of 30 days, during which time he used his machine every night with percent used days greater than 4 hours at 100%, average usage of 6 hours and 48 minutes, residual AHI 2.4 per hour, leak low generally speaking, pressure of 15 cm.    I saw Jacob House on 11/17/2014 at which time he reported full compliance with CPAP. I reviewed his pulse ox report which indicated that he was appropriately treated with CPAP only and  did not need supplemental oxygen thankfully. He changed from a nasal mask to nasal pillows interface in February 2016 and like to it. He had some issues with low back pain.    I reviewed his CPAP compliance data from 10/29/15 to 11/27/15, which is a total of 30 days, during which time He used his machine 29 days with percent used days greater than 4 hours at 96.7%, indicating excellent compliance with an average usage of 6 hours and 16 minutes, residual AHI 3.4 per hour, pressure at 15 cm, leak low.    Addendum 11/17/2014: Later in the afternoon I received  his CPAP compliance data from his DME company via fax. This was from 08/24/2014 through 09/22/2014 which is a total of 30 days during which time he used his machine every night with percent used days greater than 4 hours at 100%, indicating superb compliance with an average usage of 6 hours and 35 minutes. Residual AHI acceptable at 3.9 per hour on a pressure of 15 cm and leak low. This indicates superb compliance and of course supports his verbal report of full compliance.    I saw Jacob House on 05/09/2014, at which time he reported compliance with treatment. He had some residual daytime somnolence and some issues with mask fitting. He was using a [place] on nasal mask. He was still not always fully rested in the mornings. He was given a prescription for Voltaren by his orthopedic doctor for his arthritis. He was trying to lose weight and had in fact lost some weight. I asked Jacob House to meet with his DME provider for mask refit. I also suggested we proceed with an overnight pulse oximetry test to ensure that his oxygen saturations were fine on treatment.   I reviewed the patient's overnight pulse oximetry report from 09/23/2014 while on CPAP therapy at night. His average oxygen saturation was 95.2%, total test time was 6 hours and 11 minutes. Lowest oxygen saturation was 86%. Time below 88% saturation was 28 seconds. Based on these test results, it appears that the  patient is appropriately treated on room air with CPAP only and does not require additional supplemental oxygen at this time. Average pulse rate was 60.2 bpm, maximum was 99 bpm, minimum was 46 bpm during the testing time.    I reviewed his compliance data from 04/10/14 to 05/08/14, which is 30 days, during which time he was 100%, percent used days greater than 4 hours was 90%, indicating excellent compliance. Residual AHI was 3.1 per hour. Leak was at times quite high.    I saw Jacob House on 12/08/2013, at which time I advised Jacob House regarding his sleep study results and his compliance. He indicated using his CPAP regularly. There was no data on his SD card. His [employer] CPAP machine indicated that he had been using it every day for the previous 30 days. He reported sleeping well with CPAP and having adjusted fairly well to it. His sleepiness was improved. I asked Jacob House to continue using CPAP regularly.   I reviewed the patient's CPAP compliance data from 11/09/2013 to 12/08/2013, which is a total of 30 days, during which time the patient used CPAP every day. The average usage for all days was 6 hours and 39 minutes. The percent used days greater than 4 hours was 100 %, indicating superb compliance. The residual AHI was 5.6 per hour, indicating an adequate treatment pressure of 15 cwp. Air leak from the mask was low.   I first met Jacob House on 08/31/2013, at which time he reported snoring and excessive daytime somnolence as well as witnessed apneas. I requested he return for sleep study. He had a split-night sleep study on 10/06/2013: Baseline sleep efficiency was significantly reduced at 45.5% with a long sleep latency of 116.5 minutes and wake after sleep onset of 34 minutes with mild sleep fragmentation noted. He had an elevated arousal index. He had markedly increased percentage of stage II sleep, near absence of slow-wave sleep, and absence of REM sleep prior to CPAP. He had no clinically significant periodic  leg movements or EKG changes. He had moderate to loud snoring.  His total AHI was 57.8 per hour. Baseline oxygen saturation was 91%, nadir was 67%. He was then titrated on CPAP with sleep efficiency improved at 73.3% with a latency to sleep of 50.5 minutes and wake after sleep onset of 3.5 minutes with mild sleep fragmentation noted. His arousal index was normal. He had a increased percentage of stage II sleep, near absence of slow-wave sleep and an increased percentage of REM sleep at 34.4%. Average oxygen saturation was improved at 93%, nadir was 70% which was during the initial titration period. On the final pressure of 14 cm his O2 nadir was 91% but he did not achieve REM sleep on the final pressure. He was titrated from 4-14 cm with reduction of the AHI of 6.2 events per hour at the final pressure. Based on the test results I did start the patient on CPAP at a pressure of 15 cm as he had a residual AHI of over 5/h on the final pressure of 14 cm during the study.     I reviewed his compliance data from 10/26/2013 through 11/07/2013 which is a total of 13 days during which time he was CPAP every night. Percent used days greater than 4 hours was 100%, average usage was 7 hours and 17 minutes. Residual AHI was 8 per hour on a pressure of 15 cm with no significant leak.   His Past Medical History Is Significant For: Past Medical History:  Diagnosis Date  . Allergy   . Carpal tunnel syndrome   . Complication of anesthesia 04-23-2013   pt told by anesthesia after probably has sleep apnea  . Complication of anesthesia    cannot lie on left side due to recent shoulder surgery per pt  . GERD (gastroesophageal reflux disease)   . OSA on CPAP 12/08/2013  . Snoring     His Past Surgical History Is Significant For: Past Surgical History:  Procedure Laterality Date  . ANAL FISSURE REPAIR  july 2009 and yrs ago   x 2   . CHOLECYSTECTOMY  1999  . COLONOSCOPY WITH PROPOFOL N/A 07/22/2013   Procedure:  COLONOSCOPY WITH PROPOFOL;  Surgeon: Cleotis Nipper, MD;  Location: [facility];  Service: Endoscopy;  Laterality: N/A;  . ESOPHAGOGASTRODUODENOSCOPY (EGD) WITH PROPOFOL N/A 07/22/2013   Procedure: ESOPHAGOGASTRODUODENOSCOPY (EGD) WITH PROPOFOL;  Surgeon: Cleotis Nipper, MD;  Location: [facility];  Service: Endoscopy;  Laterality: N/A;  . EYE SURGERY Bilateral 2005   lasix  . FRACTURE SURGERY Right    foot  . GALLBLADDER SURGERY    . SHOULDER SURGERY Left sept 2014   cartlidge repair    His Family History Is Significant For: Family History  Problem Relation Age of Onset  . Cancer Mother 78       colon, stomach  . Cancer Father        testicular  . Cancer Maternal Grandmother        Colon and Breast cancer  . Cancer Maternal Grandfather        Colon cancer  . Cancer Maternal Uncle        colon  . Cancer Maternal Uncle        colon    His Social History Is Significant For: Social History   Socioeconomic History  . Marital status: Significant Other    Spouse name: Juliann Pulse  . Number of children: 0  . Years of education: 30  . Highest education level: Not on file  Occupational History  . Occupation: Glass blower/designer  Employer: [employer]  Tobacco Use  . Smoking status: Never Smoker  . Smokeless tobacco: Never Used  Substance and Sexual Activity  . Alcohol use: Yes    Alcohol/week: 0.0 - 1.0 standard drinks    Comment: rare  . Drug use: No  . Sexual activity: Yes    Partners: Female    Birth control/protection: Condom    Comment: 1 sexual partner in last year  Other Topics Concern  . Not on file  Social History Narrative   Lives with his significant other, Juliann Pulse, and their cats.   Social Determinants of Health   Financial Resource Strain:   . Difficulty of Paying Living Expenses:   Food Insecurity:   . Worried About Charity fundraiser in the Last Year:   . Arboriculturist in the Last Year:   Transportation Needs:   . Film/video editor  (Medical):   Marland Kitchen Lack of Transportation (Non-Medical):   Physical Activity:   . Days of Exercise per Week:   . Minutes of Exercise per Session:   Stress:   . Feeling of Stress :   Social Connections:   . Frequency of Communication with Friends and Family:   . Frequency of Social Gatherings with Friends and Family:   . Attends Religious Services:   . Active Member of Clubs or Organizations:   . Attends Archivist Meetings:   Marland Kitchen Marital Status:     His Allergies Are:  No Known Allergies:   His Current Medications Are:  Outpatient Encounter Medications as of 12/29/2019  Medication Sig  . fluticasone (FLONASE) 50 MCG/ACT nasal spray Place 2 sprays into both nostrils daily.  . [DISCONTINUED] hydrOXYzine (ATARAX/VISTARIL) 25 MG tablet Take 0.5-1 tablets (12.5-25 mg total) by mouth every 8 (eight) hours as needed for itching.  . [DISCONTINUED] omeprazole (PRILOSEC) 20 MG capsule Take 1 capsule (20 mg total) by mouth daily.  . [DISCONTINUED] Polyethylene Glycol 3350 (MIRALAX PO) Take by mouth daily.   No facility-administered encounter medications on file as of 12/29/2019.  :  Review of Systems:  Out of a complete 14 point review of systems, all are reviewed and negative with the exception of these symptoms as listed below: Review of Systems  Neurological:       Here for f/u on cpap. Reports he has been doing well. No problems- reports he is in need of a letter stating he is complaint with cpap.    Objective:  Neurological Exam  Physical Exam Physical Examination:   Vitals:   12/29/19 1456  BP: 123/72  Pulse: 69    General Examination: The patient is a very pleasant 54 y.o. male in no acute distress. He appears well-developed and well-nourished and well groomed.   HEENT:No obvious changes. Hearing is grossly intact. Face is symmetric with normal facial animation and normal facial sensation. Speech is clear with no dysarthria noted. There is no hypophonia. There is no  lip, neck/head, jaw or voice tremor. Neck shows FROM. Oropharynx exam reveals: no obvious change, mild mouth dryness, tongue protrudes centrally and palate elevates symmetrically.   Chest:Clear to auscultation without wheezing, rhonchi or crackles noted.  Heart:S1+S2+0, regular and normal without murmurs, rubs or gallops noted.   Abdomen:Soft, non-tender and non-distended.  Extremities:There is no pitting edema in the distal lower extremities bilaterally.   Skin: Warm and dry without trophic changes noted.   Musculoskeletal: exam reveals no obvious joint deformities, tenderness or joint swelling or erythema.   Neurologically:  Mental status: The patient is awake, alert and oriented in all 4 spheres. His memory, attention, language and knowledge are appropriate. There is no aphasia, agnosia, apraxia or anomia. Speech is clear with normal prosody and enunciation. Thought process is linear. Mood is congruent and affect is normal.  Cranial nerves are as described above under HEENT exam.  Motor exam: Normal bulk, strength and tone is noted. There is no tremor. Fine motor skills are intactgrossly.  Cerebellar testing shows no dysmetria or intention tremor. There is no truncal or gait ataxia.  Sensory exam is intact to light touch in the upper and lower extremities.  Gait, station and balance are unremarkable.   Assessment and Plan:   In summary, Yair Dusza is a very pleasant 54 year old male with an underlying medical history of carpal tunnel syndrome, arthritis and obesity, who presents for followup consultation of his obstructive sleep apnea, welltreated with CPAP of 15 cm with ongoing superb complianceand ongoing good results.  Unfortunately, he had Covid in January but has recuperated well and is now fully vaccinated thankfully.  He has been fully compliant with treatment for the past years.  I provided a letter of support in that regard again today.  He needs a letter to  attest to his full compliance every year. Of note, he had a split-night sleep study in March 2015.He is trying to lose weight, and does report some weight gain over this past year.  He was able to get a new CPAP machine last year.  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  He is typically up-to-date with his supplies.  He is advised to continue to work on weight loss, continue to be fully compliant with his CPAP and is commended for his treatment adherence.  He is advised to follow-up routinely in 1 year, sooner if needed.  I answered all his questions today and he was in agreement.    I spent 20 minutes in total face-to-face time and in reviewing records during pre-charting, more than 50% of which was spent in counseling and coordination of care, reviewing test results, reviewing medications and treatment regimen and/or in discussing or reviewing the diagnosis of OSA on CPAP, the prognosis and treatment options. Pertinent laboratory and imaging test results that were available during this visit with the patient were reviewed by me and considered in my medical decision making (see chart for details).

## 2020-01-28 DIAGNOSIS — G4733 Obstructive sleep apnea (adult) (pediatric): Secondary | ICD-10-CM | POA: Diagnosis not present

## 2020-02-07 ENCOUNTER — Emergency Department (HOSPITAL_COMMUNITY)
Admission: EM | Admit: 2020-02-07 | Discharge: 2020-02-07 | Disposition: A | Discharge status: Home / Self Care | Payer: BLUE CROSS/BLUE SHIELD | Attending: Emergency Medicine | Admitting: Emergency Medicine

## 2020-02-07 ENCOUNTER — Other Ambulatory Visit: Payer: Self-pay

## 2020-02-07 ENCOUNTER — Encounter (HOSPITAL_COMMUNITY): Payer: Self-pay

## 2020-02-07 ENCOUNTER — Emergency Department (HOSPITAL_COMMUNITY): Payer: BLUE CROSS/BLUE SHIELD

## 2020-02-07 DIAGNOSIS — I517 Cardiomegaly: Secondary | ICD-10-CM | POA: Diagnosis not present

## 2020-02-07 DIAGNOSIS — E669 Obesity, unspecified: Secondary | ICD-10-CM | POA: Insufficient documentation

## 2020-02-07 DIAGNOSIS — R450 Nervousness: Secondary | ICD-10-CM | POA: Insufficient documentation

## 2020-02-07 DIAGNOSIS — R079 Chest pain, unspecified: Secondary | ICD-10-CM | POA: Diagnosis not present

## 2020-02-07 DIAGNOSIS — R0789 Other chest pain: Secondary | ICD-10-CM

## 2020-02-07 LAB — CBC
HCT: 43.4 % (ref 39.0–52.0)
Hemoglobin: 14.2 g/dL (ref 13.0–17.0)
MCH: 28.3 pg (ref 26.0–34.0)
MCHC: 32.7 g/dL (ref 30.0–36.0)
MCV: 86.6 fL (ref 80.0–100.0)
Platelets: 263 10*3/uL (ref 150–400)
RBC: 5.01 MIL/uL (ref 4.22–5.81)
RDW: 12.3 % (ref 11.5–15.5)
WBC: 6.5 10*3/uL (ref 4.0–10.5)
nRBC: 0 % (ref 0.0–0.2)

## 2020-02-07 LAB — TROPONIN I (HIGH SENSITIVITY)
Troponin I (High Sensitivity): 2 ng/L (ref <18)
Troponin I (High Sensitivity): <2 ng/L (ref <18)

## 2020-02-07 LAB — BASIC METABOLIC PANEL
Anion gap: 9 (ref 5–15)
BUN: 13 mg/dL (ref 6–20)
CO2: 29 mmol/L (ref 22–32)
Calcium: 9.2 mg/dL (ref 8.9–10.3)
Chloride: 103 mmol/L (ref 98–111)
Creatinine, Ser: 1.03 mg/dL (ref 0.61–1.24)
GFR calc Af Amer: >60 mL/min (ref >60)
GFR calc non Af Amer: >60 mL/min (ref >60)
Glucose, Bld: 115 mg/dL — ABNORMAL HIGH (ref 70–99)
Potassium: 3.7 mmol/L (ref 3.5–5.1)
Sodium: 141 mmol/L (ref 135–145)

## 2020-02-07 NOTE — ED Provider Notes (Signed)
[facility] DEPT Provider Note   CSN: [id number] Arrival date & time: 02/07/20  1517     History Chief Complaint  Patient presents with  . Chest Pain    Jacob House is a 54 y.o. male.  Jacob House is a 54 y.o. male with a history of GERD, sleep apnea, and carpal tunnel, who presents to the ED for evaluation of chest pain.  This episode of chest pain started about 2 to 3 hours prior to arrival.  He reports pain is located over the left upper chest and is a dull constant ache that does not radiate anywhere.  Pain is not worse with exertion and it is not pleuritic in nature.  Patient states that he thinks pain may be related to stress, he has been dealing with a lot at home and is currently in the process of trying to sell his house.  He states that he has had some similar pains twice in the past when he has been dealing with very stressful events in his life.  He denies any associated lightheadedness or syncope.  No nausea, vomiting or diaphoresis.  No shortness of breath or cough.  No fevers.  No abdominal pain.  Aside from stress and anxiety he has not noted any other factors that seem to make pain better or worse, he tried half a tablet of hydroxyzine that he found at home today without much improvement, has not tried anything else to treat pain.  No prior history of heart disease or MI.  Patient is obese but does not have history of hypertension, hyperlipidemia or diabetes, denies any smoking history or substance abuse.  No lower extremity pain or swelling, no history of PE or DVT, no recent long distance travel or surgery.        Past Medical History:  Diagnosis Date  . Allergy   . Carpal tunnel syndrome   . Complication of anesthesia 04-23-2013   pt told by anesthesia after probably has sleep apnea  . Complication of anesthesia    cannot lie on left side due to recent shoulder surgery per pt  . GERD (gastroesophageal reflux disease)   . OSA on CPAP  12/08/2013  . Snoring     Patient Active Problem List   Diagnosis Date Noted  . Acute paronychia of toe of right foot 02/24/2018  . OSA on CPAP 12/08/2013  . OSA on CPAP 12/08/2013  . Colon polyps 08/06/2012  . Carpal tunnel syndrome 06/13/2012    Past Surgical History:  Procedure Laterality Date  . ANAL FISSURE REPAIR  july 2009 and yrs ago   x 2   . CHOLECYSTECTOMY  1999  . COLONOSCOPY WITH PROPOFOL N/A 07/22/2013   Procedure: COLONOSCOPY WITH PROPOFOL;  Surgeon: Cleotis Nipper, MD;  Location: [facility];  Service: Endoscopy;  Laterality: N/A;  . ESOPHAGOGASTRODUODENOSCOPY (EGD) WITH PROPOFOL N/A 07/22/2013   Procedure: ESOPHAGOGASTRODUODENOSCOPY (EGD) WITH PROPOFOL;  Surgeon: Cleotis Nipper, MD;  Location: [facility];  Service: Endoscopy;  Laterality: N/A;  . EYE SURGERY Bilateral 2005   lasix  . FRACTURE SURGERY Right    foot  . GALLBLADDER SURGERY    . SHOULDER SURGERY Left sept 2014   cartlidge repair       Family History  Problem Relation Age of Onset  . Cancer Mother 62       colon, stomach  . Cancer Father        testicular  . Cancer Maternal Grandmother  Colon and Breast cancer  . Cancer Maternal Grandfather        Colon cancer  . Cancer Maternal Uncle        colon  . Cancer Maternal Uncle        colon    Social History   Tobacco Use  . Smoking status: Never Smoker  . Smokeless tobacco: Never Used  Substance Use Topics  . Alcohol use: Yes    Alcohol/week: 0.0 - 1.0 standard drinks    Comment: rare  . Drug use: No    Home Medications Prior to Admission medications   Medication Sig Start Date End Date Taking? Authorizing Provider  fluticasone (FLONASE) 50 MCG/ACT nasal spray Place 2 sprays into both nostrils daily.    [provider]    Allergies    Patient has no known allergies.  Review of Systems   Review of Systems  Constitutional: Negative for chills and fever.  HENT: Negative.   Eyes: Negative for visual  disturbance.  Respiratory: Negative for cough and shortness of breath.   Cardiovascular: Positive for chest pain. Negative for palpitations and leg swelling.  Gastrointestinal: Negative for abdominal pain, nausea and vomiting.  Genitourinary: Negative for dysuria and frequency.  Musculoskeletal: Negative for arthralgias and back pain.  Skin: Negative for color change and rash.  Neurological: Negative for dizziness, syncope and light-headedness.  Psychiatric/Behavioral: The patient is nervous/anxious.     Physical Exam Updated Vital Signs BP (!) 148/81 (BP Location: Right Arm)   Pulse 71   Temp 98.2 F (36.8 C) (Oral)   Resp 18   SpO2 100%   Physical Exam Vitals and nursing note reviewed.  Constitutional:      General: He is not in acute distress.    Appearance: He is well-developed. He is obese. He is not ill-appearing or diaphoretic.  HENT:     Head: Normocephalic and atraumatic.  Eyes:     General:        Right eye: No discharge.        Left eye: No discharge.     Pupils: Pupils are equal, round, and reactive to light.  Cardiovascular:     Rate and Rhythm: Normal rate and regular rhythm.     Pulses:          Radial pulses are 2+ on the right side and 2+ on the left side.       Dorsalis pedis pulses are 2+ on the right side and 2+ on the left side.     Heart sounds: Normal heart sounds. No murmur heard.  No friction rub. No gallop.   Pulmonary:     Effort: Pulmonary effort is normal. No respiratory distress.     Breath sounds: Normal breath sounds. No wheezing or rales.     Comments: Respirations equal and unlabored, patient able to speak in full sentences, lungs clear to auscultation bilaterally Abdominal:     General: Bowel sounds are normal. There is no distension.     Palpations: Abdomen is soft. There is no mass.     Tenderness: There is no abdominal tenderness. There is no guarding.     Comments: Abdomen soft, nondistended, nontender to palpation in all quadrants  without guarding or peritoneal signs  Musculoskeletal:        General: No deformity.     Cervical back: Neck supple.     Right lower leg: No tenderness. No edema.     Left lower leg: No tenderness. No edema.  Skin:    General: Skin is warm and dry.     Capillary Refill: Capillary refill takes less than 2 seconds.  Neurological:     Mental Status: He is alert.     Coordination: Coordination normal.     Comments: Speech is clear, able to follow commands Moves extremities without ataxia, coordination intact  Psychiatric:        Mood and Affect: Mood normal.        Behavior: Behavior normal.     ED Results / Procedures / Treatments   Labs (all labs ordered are listed, but only abnormal results are displayed) Labs Reviewed  BASIC METABOLIC PANEL - Abnormal; Notable for the following components:      Result Value   Glucose, Bld 115 (*)    All other components within normal limits  CBC  TROPONIN I (HIGH SENSITIVITY)  TROPONIN I (HIGH SENSITIVITY)    EKG EKG Interpretation  Date/Time:  Monday February 07 2020 15:29:26 EDT Ventricular Rate:  67 PR Interval:    QRS Duration: 113 QT Interval:  389 QTC Calculation: 411 R Axis:   16 Text Interpretation: Sinus rhythm Borderline intraventricular conduction delay Low voltage, precordial leads Abnormal R-wave progression, early transition Abnormal inferior Q waves 12 Lead; Mason-Likar No significant change since last tracing Confirmed by Wandra Arthurs [id number]) on 02/07/2020 8:17:05 PM   Radiology DG Chest 2 View  Result Date: 02/07/2020 CLINICAL DATA:  Chest pain. EXAM: CHEST - 2 VIEW COMPARISON:  Prior chest radiographs 08/21/2017 and earlier FINDINGS: Unchanged cardiomegaly. No appreciable airspace consolidation within the lungs. No frank pulmonary edema. No evidence of pleural effusion or pneumothorax. No acute bony abnormality identified. Thoracic spondylosis. IMPRESSION: No evidence of acute cardiopulmonary abnormality. Unchanged  cardiomegaly. Electronically Signed   By: Kellie Simmering DO   On: 02/07/2020 16:15    Procedures Procedures (including critical care time)  Medications Ordered in ED Medications - No data to display  ED Course  I have reviewed the triage vital signs and the nursing notes.  Pertinent labs & imaging results that were available during my care of the patient were reviewed by me and considered in my medical decision making (see chart for details).    MDM Rules/Calculators/A&P                          Patient presents to the emergency department with chest pain. Patient nontoxic appearing, in no apparent distress, vitals without significant abnormality. Fairly benign physical exam.   Prior heart catheterization reviewed: none Patient's primary cardiologist: none  DDX: ACS, pulmonary embolism, dissection, pneumothorax, pneumonia, arrhythmia, severe anemia, MSK, GERD, anxiety. Evaluation initiated with labs, EKG, and CXR. Patient on cardiac monitor.   CBC: No leukocytosis, normal hemoglobin BMP: Glucose of 115, patient not fasting, no other electrolyte derangements, normal renal function Troponin: Negative x2 EKG: Normal sinus rhythm with no significant changes when compared to previous CXR:  Negative, without infiltrate, effusion, pneumothorax, or fracture/dislocation.   Hearth Pathway Score 3- EKG without obvious acute ischemia, delta troponin negative, doubt ACS. Patient is low risk wells, PERC negative, doubt pulmonary embolism. Pain is not a tearing sensation, symmetric pulses, no widening of mediastinum on CXR, doubt dissection. Cardiac monitor reviewed, no notable arrhythmias or tachycardia. Patient has appeared hemodynamically stable throughout ER visit and appears safe for discharge with close PCP/cardiology follow up. I discussed results, treatment plan, need for PCP follow-up, and return precautions with the patient. Provided opportunity for  questions, patient confirmed understanding  and is in agreement with plan.   Final Clinical Impression(s) / ED Diagnoses Final diagnoses:  Atypical chest pain    Rx / DC Orders ED Discharge Orders    None       Janet Berlin 02/07/20 2059    Drenda Freeze, MD 02/08/20 [id number]

## 2020-02-07 NOTE — Discharge Instructions (Signed)
You were seen in the emergency department today for chest pain. Your work-up in the emergency department has been overall reassuring. Your labs have been fairly normal and or similar to previous blood work you have had done. Your EKG and the enzyme we use to check your heart did not show an acute heart attack at this time. Your chest x-ray was normal.  ° °We would like you to follow up closely with your primary care provider and/or the cardiologist provided in your discharge instructions within 1-3 days. Return to the ER immediately should you experience any new or worsening symptoms including but not limited to return of pain, worsened pain, vomiting, shortness of breath, dizziness, lightheadedness, passing out, or any other concerns that you may have.   °

## 2020-02-07 NOTE — ED Triage Notes (Signed)
Pt reports chest pain that started about 2-3 hours ago. Pt denies N/V/D and SHOB. Pt states that he has pain like this before, but could not remember what he was diagnosed with.

## 2020-02-15 DIAGNOSIS — Z6838 Body mass index (BMI) 38.0-38.9, adult: Secondary | ICD-10-CM | POA: Diagnosis not present

## 2020-02-15 DIAGNOSIS — R0789 Other chest pain: Secondary | ICD-10-CM | POA: Diagnosis not present

## 2020-03-14 NOTE — Progress Notes (Signed)
Cardiology Office Note:   Date:  03/16/2020  NAME:  Jacob House    MRN: [id number] DOB:  Jan 28, 1966   PCP:  Sharilyn Sites, MD  Cardiologist:  No primary care provider on file.  Electrophysiologist:  None   Referring MD: Cory Munch, PA-C   Chief Complaint  Patient presents with  . Chest Pain   History of Present Illness:   Jacob House is a 54 y.o. male with a hx of OSA, GERD who is being seen today for the evaluation of chest pain at the request of Sharilyn Sites, MD. Evaluated in the ER 02/07/2020 for atypical CP. Negative enzymes.  He presents for evaluation of chest pain that occurred 1 month ago.  He reports he has had significant stress in his life as he recently sold his townhouse and moved into an apartment.  Apparently his girlfriend is not happy with the living arrangements.  He reports on the night in question he had an argument with her and then had achy chest pain in the center of his chest.  Lasted for several hours.  The pain did not radiate.  It was not alleviated by rest and was not worsened by exertion.  He reports the pain is resolved by itself.  He reports he had on and off chest pain mainly associated with stressful situations.  He apparently was evaluated in 2014 for similar pain and a normal cardiac stress test.  His EKG today is normal.  He is a never smoker.  He does not consume alcohol or drugs.  His most recent lipid profile shows a total cholesterol 192, HDL 53, LDL 112, triglycerides 151.  He is not diabetic.  Most recent kidney function is normal.  Most recent thyroid studies are normal 1.53.  His cardiovascular exam is normal today.  Family history significant for colon cancer.  No heart disease in the family.  Past Medical History: Past Medical History:  Diagnosis Date  . Allergy   . Carpal tunnel syndrome   . Complication of anesthesia 04-23-2013   pt told by anesthesia after probably has sleep apnea  . Complication of anesthesia    cannot lie on left  side due to recent shoulder surgery per pt  . GERD (gastroesophageal reflux disease)   . OSA on CPAP 12/08/2013  . Snoring     Past Surgical History: Past Surgical History:  Procedure Laterality Date  . ANAL FISSURE REPAIR  july 2009 and yrs ago   x 2   . CHOLECYSTECTOMY  1999  . COLONOSCOPY WITH PROPOFOL N/A 07/22/2013   Procedure: COLONOSCOPY WITH PROPOFOL;  Surgeon: Cleotis Nipper, MD;  Location: [facility];  Service: Endoscopy;  Laterality: N/A;  . ESOPHAGOGASTRODUODENOSCOPY (EGD) WITH PROPOFOL N/A 07/22/2013   Procedure: ESOPHAGOGASTRODUODENOSCOPY (EGD) WITH PROPOFOL;  Surgeon: Cleotis Nipper, MD;  Location: [facility];  Service: Endoscopy;  Laterality: N/A;  . EYE SURGERY Bilateral 2005   lasix  . FRACTURE SURGERY Right    foot  . GALLBLADDER SURGERY    . SHOULDER SURGERY Left sept 2014   cartlidge repair    Current Medications: Current Meds  Medication Sig  . fluticasone (FLONASE) 50 MCG/ACT nasal spray Place 2 sprays into both nostrils daily.     Allergies:    Patient has no known allergies.   Social History: Social History   Socioeconomic History  . Marital status: Significant Other    Spouse name: Jacob House  . Number of children: 0  . Years of education: 68  .  Highest education level: Not on file  Occupational History  . Occupation: IT sales professional: [employer]  Tobacco Use  . Smoking status: Never Smoker  . Smokeless tobacco: Never Used  Substance and Sexual Activity  . Alcohol use: Yes    Alcohol/week: 0.0 - 1.0 standard drinks    Comment: rare  . Drug use: No  . Sexual activity: Yes    Partners: Female    Birth control/protection: Condom    Comment: 1 sexual partner in last year  Other Topics Concern  . Not on file  Social History Narrative   Lives with his significant other, Jacob House, and their cats.   Social Determinants of Health   Financial Resource Strain:   . Difficulty of Paying Living Expenses:   Food Insecurity:    . Worried About Charity fundraiser in the Last Year:   . Arboriculturist in the Last Year:   Transportation Needs:   . Film/video editor (Medical):   Marland Kitchen Lack of Transportation (Non-Medical):   Physical Activity:   . Days of Exercise per Week:   . Minutes of Exercise per Session:   Stress:   . Feeling of Stress :   Social Connections:   . Frequency of Communication with Friends and Family:   . Frequency of Social Gatherings with Friends and Family:   . Attends Religious Services:   . Active Member of Clubs or Organizations:   . Attends Archivist Meetings:   Marland Kitchen Marital Status:      Family History: The patient's family history includes Cancer in his father, maternal grandfather, maternal grandmother, maternal uncle, and maternal uncle; Cancer (age of onset: 68) in his mother; Heart disease in his maternal uncle.  ROS:   All other ROS reviewed and negative. Pertinent positives noted in the HPI.     EKGs/Labs/Other Studies Reviewed:   The following studies were personally reviewed by me today:  EKG:  EKG is ordered today.  The ekg ordered today demonstrates normal sinus rhythm, heart rate 65, no acute ST-T changes, no evidence for infarction, and was personally reviewed by me.   Recent Labs: 02/07/2020: BUN 13; Creatinine, Ser 1.03; Hemoglobin 14.2; Platelets 263; Potassium 3.7; Sodium 141   Recent Lipid Panel No results found for: CHOL, TRIG, HDL, CHOLHDL, VLDL, LDLCALC, LDLDIRECT  Physical Exam:   VS:  BP 130/78   House 65   Temp 97.7 F (36.5 C)   Ht _0  (1.676 m)   Wt 239 lb 6.4 oz (108.6 kg)   SpO2 98%   BMI 38.64 kg/m    Wt Readings from Last 3 Encounters:  03/16/20 239 lb 6.4 oz (108.6 kg)  12/29/19 243 lb 5 oz (110.4 kg)  04/08/19 235 lb (106.6 kg)    General: Well nourished, well developed, in no acute distress Heart: Atraumatic, normal size  Eyes: PEERLA, EOMI  Neck: Supple, no JVD Endocrine: No thryomegaly Cardiac: Normal S1, S2; RRR; no  murmurs, rubs, or gallops Lungs: Clear to auscultation bilaterally, no wheezing, rhonchi or rales  Abd: Soft, nontender, no hepatomegaly  Ext: No edema, pulses 2+ Musculoskeletal: No deformities, BUE and BLE strength normal and equal Skin: Warm and dry, no rashes   Neuro: Alert and oriented to person, place, time, and situation, CNII-XII grossly intact, no focal deficits  Psych: Normal mood and affect   ASSESSMENT:   Ido Wollman is a 54 y.o. male who presents for the following: 1. Other chest  pain    PLAN:   1. Other chest pain -Atypical chest pain.  Stress related.  EKG today is normal.  Cardiovascular TMs normal.  CVD risk factors include slightly elevated cholesterol and obesity.  He also has sleep apnea.  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  Given his high risk profession I think we should evaluate this further.  He had a stress test in 2014 that was normal.  I recommended a coronary CTA for definitive evaluation.  We will plan for a BMP today.  He will take 50 mg of metoprolol tartrate 2 hours before the scan.  We'll follow the results with him by phone.  Disposition: Return if symptoms worsen or fail to improve.  Medication Adjustments/Labs and Tests Ordered: Current medicines are reviewed at length with the patient today.  Concerns regarding medicines are outlined above.  Orders Placed This Encounter  Procedures  . CT CORONARY MORPH W/CTA COR W/SCORE W/CA W/CM &/OR WO/CM  . CT CORONARY FRACTIONAL FLOW RESERVE DATA PREP  . CT CORONARY FRACTIONAL FLOW RESERVE FLUID ANALYSIS  . Basic metabolic panel  . EKG 12-Lead   Meds ordered this encounter  Medications  . metoprolol tartrate (LOPRESSOR) 50 MG tablet    Sig: Take 1 tablet by mouth once for procedure.    Dispense:  1 tablet    Refill:  0    Patient Instructions  Medication Instructions:  Take Metorpolol 50 mg two hours before CT when scheduled.   *If you need a refill on your cardiac medications  before your next appointment, please call your pharmacy*   Lab Work: BMET today   If you have labs (blood work) drawn today and your tests are completely normal, you will receive your results only by: Marland Kitchen MyChart Message (if you have MyChart) OR . A paper copy in the mail If you have any lab test that is abnormal or we need to change your treatment, we will call you to review the results.   Testing/Procedures: Your physician has requested that you have cardiac CT. Cardiac computed tomography (CT) is a painless test that uses an x-ray machine to take clear, detailed pictures of your heart. For further information please visit HugeFiesta.tn. Please follow instruction sheet as given.    Follow-Up: At [facility], you and your health needs are our priority.  As part of our continuing mission to provide you with exceptional heart care, we have created designated Provider Care Teams.  These Care Teams include your primary Cardiologist (physician) and Advanced Practice Providers (APPs -  Physician Assistants and Nurse Practitioners) who all work together to provide you with the care you need, when you need it.  We recommend signing up for the patient portal called "MyChart".  Sign up information is provided on this After Visit Summary.  MyChart is used to connect with patients for Virtual Visits (Telemedicine).  Patients are able to view lab/test results, encounter notes, upcoming appointments, etc.  Non-urgent messages can be sent to your provider as well.   To learn more about what you can do with MyChart, go to [email or web address].    Your next appointment:   As needed  The format for your next appointment:   In Person  Provider:   Eleonore Chiquito, MD   Other Instructions Your cardiac CT will be scheduled at one of the below locations:   [facility] [address] [place], [place] [address] [phone]  If scheduled at [facility], please arrive at  the [facility] main entrance of [facility] 30 minutes prior to test start time. Proceed to the [facility] Radiology Department (first floor) to check-in and test prep.  Please follow these instructions carefully (unless otherwise directed):  Hold all erectile dysfunction medications at least 3 days (72 hrs) prior to test.  On the Night Before the Test: . Be sure to Drink plenty of water. . Do not consume any caffeinated/decaffeinated beverages or chocolate 12 hours prior to your test. . Do not take any antihistamines 12 hours prior to your test.  On the Day of the Test: . Drink plenty of water. Do not drink any water within one hour of the test. . Do not eat any food 4 hours prior to the test. . You may take your regular medications prior to the test.  . Take metoprolol (Lopressor) two hours prior to test. . HOLD Furosemide/Hydrochlorothiazide morning of the test. . FEMALES- please wear underwire-free bra if available       After the Test: . Drink plenty of water. . After receiving IV contrast, you may experience a mild flushed feeling. This is normal. . On occasion, you may experience a mild rash up to 24 hours after the test. This is not dangerous. If this occurs, you can take Benadryl 25 mg and increase your fluid intake. . If you experience trouble breathing, this can be serious. If it is severe call 911 IMMEDIATELY. If it is mild, please call our office. . If you take any of these medications: Glipizide/Metformin, Avandament, Glucavance, please do not take 48 hours after completing test unless otherwise instructed.   Once we have confirmed authorization from your insurance company, we will call you to set up a date and time for your test. Based on how quickly your insurance processes prior authorizations requests, please allow up to 4 weeks to be contacted for scheduling your Cardiac CT appointment. Be advised that routine Cardiac CT appointments could be scheduled as many  as 8 weeks after your provider has ordered it.  For non-scheduling related questions, please contact the cardiac imaging nurse navigator should you have any questions/concerns: Marchia Bond, Cardiac Imaging Nurse Navigator Burley Saver, Interim Cardiac Imaging Nurse [facility] Direct Office Dial: [phone]   For scheduling needs, including cancellations and rescheduling, please call Vivien Rota at [phone], option 3.         Signed, Addison Naegeli. Audie Box, [place]  [address], [place], [place] [address] [phone]  03/16/2020 4:03 PM

## 2020-03-16 ENCOUNTER — Other Ambulatory Visit: Payer: Self-pay

## 2020-03-16 ENCOUNTER — Ambulatory Visit (INDEPENDENT_AMBULATORY_CARE_PROVIDER_SITE_OTHER): Payer: BLUE CROSS/BLUE SHIELD | Admitting: Cardiovascular Disease

## 2020-03-16 ENCOUNTER — Encounter: Payer: Self-pay | Admitting: Cardiovascular Disease

## 2020-03-16 VITALS — BP 130/78 | HR 65 | Temp 97.7°F | Ht 66.0 in | Wt 239.4 lb

## 2020-03-16 DIAGNOSIS — R0789 Other chest pain: Secondary | ICD-10-CM

## 2020-03-16 MED ORDER — METOPROLOL TARTRATE 50 MG PO TABS
ORAL_TABLET | ORAL | 0 refills | Status: DC
Start: 2020-03-16 — End: 2020-12-25

## 2020-03-16 NOTE — Patient Instructions (Signed)
Medication Instructions:  Take Metorpolol 50 mg two hours before CT when scheduled.   *If you need a refill on your cardiac medications before your next appointment, please call your pharmacy*   Lab Work: BMET today   If you have labs (blood work) drawn today and your tests are completely normal, you will receive your results only by: Marland Kitchen MyChart Message (if you have MyChart) OR . A paper copy in the mail If you have any lab test that is abnormal or we need to change your treatment, we will call you to review the results.   Testing/Procedures: Your physician has requested that you have cardiac CT. Cardiac computed tomography (CT) is a painless test that uses an x-ray machine to take clear, detailed pictures of your heart. For further information please visit [email or web address]. Please follow instruction sheet as given.    Follow-Up: At [facility], you and your health needs are our priority.  As part of our continuing mission to provide you with exceptional heart care, we have created designated Provider Care Teams.  These Care Teams include your primary Cardiologist (physician) and Advanced Practice Providers (APPs -  Physician Assistants and Nurse Practitioners) who all work together to provide you with the care you need, when you need it.  We recommend signing up for the patient portal called "MyChart".  Sign up information is provided on this After Visit Summary.  MyChart is used to connect with patients for Virtual Visits (Telemedicine).  Patients are able to view lab/test results, encounter notes, upcoming appointments, etc.  Non-urgent messages can be sent to your provider as well.   To learn more about what you can do with MyChart, go to [email or web address].    Your next appointment:   As needed  The format for your next appointment:   In Person  Provider:   Eleonore Chiquito, MD   Other Instructions Your cardiac CT will be scheduled at one of the below locations:    [facility] [address] [place], [place] [address] [phone]  If scheduled at [facility], please arrive at the [facility] main entrance of [facility] 30 minutes prior to test start time. Proceed to the [facility] Radiology Department (first floor) to check-in and test prep.  Please follow these instructions carefully (unless otherwise directed):  Hold all erectile dysfunction medications at least 3 days (72 hrs) prior to test.  On the Night Before the Test: . Be sure to Drink plenty of water. . Do not consume any caffeinated/decaffeinated beverages or chocolate 12 hours prior to your test. . Do not take any antihistamines 12 hours prior to your test.  On the Day of the Test: . Drink plenty of water. Do not drink any water within one hour of the test. . Do not eat any food 4 hours prior to the test. . You may take your regular medications prior to the test.  . Take metoprolol (Lopressor) two hours prior to test. . HOLD Furosemide/Hydrochlorothiazide morning of the test. . FEMALES- please wear underwire-free bra if available       After the Test: . Drink plenty of water. . After receiving IV contrast, you may experience a mild flushed feeling. This is normal. . On occasion, you may experience a mild rash up to 24 hours after the test. This is not dangerous. If this occurs, you can take Benadryl 25 mg and increase your fluid intake. . If you experience trouble breathing, this can be serious.  If it is severe call 911 IMMEDIATELY. If it is mild, please call our office. . If you take any of these medications: Glipizide/Metformin, Avandament, Glucavance, please do not take 48 hours after completing test unless otherwise instructed.   Once we have confirmed authorization from your insurance company, we will call you to set up a date and time for your test. Based on how quickly your insurance processes prior authorizations requests, please allow up  to 4 weeks to be contacted for scheduling your Cardiac CT appointment. Be advised that routine Cardiac CT appointments could be scheduled as many as 8 weeks after your provider has ordered it.  For non-scheduling related questions, please contact the cardiac imaging nurse navigator should you have any questions/concerns: Marchia Bond, Cardiac Imaging Nurse Navigator Burley Saver, Interim Cardiac Imaging Nurse [place] [facility] Direct Office Dial: [phone]   For scheduling needs, including cancellations and rescheduling, please call Vivien Rota at [phone], option 3.

## 2020-03-17 LAB — BASIC METABOLIC PANEL
BUN/Creatinine Ratio: 9 (ref 9–20)
BUN: 9 mg/dL (ref 6–24)
CO2: 28 mmol/L (ref 20–29)
Calcium: 9.8 mg/dL (ref 8.7–10.2)
Chloride: 102 mmol/L (ref 96–106)
Creatinine, Ser: 0.96 mg/dL (ref 0.76–1.27)
GFR calc Af Amer: 102 mL/min/{1.73_m2} (ref >59)
GFR calc non Af Amer: 89 mL/min/{1.73_m2} (ref >59)
Glucose: 98 mg/dL (ref 65–99)
Potassium: 4.9 mmol/L (ref 3.5–5.2)
Sodium: 142 mmol/L (ref 134–144)

## 2020-04-04 DIAGNOSIS — U071 COVID-19: Secondary | ICD-10-CM | POA: Diagnosis not present

## 2020-04-21 ENCOUNTER — Telehealth (HOSPITAL_COMMUNITY): Payer: Self-pay | Admitting: Emergency Medicine

## 2020-04-21 NOTE — Telephone Encounter (Signed)
Reaching out to patient to offer assistance regarding upcoming cardiac imaging study; pt verbalizes understanding of appt date/time, parking situation and where to check in, pre-test NPO status and medications ordered, and verified current allergies; name and call back number provided for further questions should they arise Hughie Melroy RN Navigator Cardiac Imaging [facility] [phone] office [phone] cell 

## 2020-04-24 ENCOUNTER — Ambulatory Visit (HOSPITAL_COMMUNITY): Payer: BLUE CROSS/BLUE SHIELD

## 2020-04-24 ENCOUNTER — Telehealth (HOSPITAL_COMMUNITY): Payer: Self-pay | Admitting: Emergency Medicine

## 2020-04-24 NOTE — Telephone Encounter (Signed)
Calling to review CCTA instructions however patient informed me that he cancelled his appt and will be calling back to r/s  Marchia Bond RN Navigator Cardiac [facility] [phone] Office  [phone] Cell

## 2020-04-26 ENCOUNTER — Ambulatory Visit (HOSPITAL_COMMUNITY): Admission: RE | Admit: 2020-04-26 | Payer: BLUE CROSS/BLUE SHIELD | Source: Ambulatory Visit

## 2020-04-29 DIAGNOSIS — G4733 Obstructive sleep apnea (adult) (pediatric): Secondary | ICD-10-CM | POA: Diagnosis not present

## 2020-05-04 DIAGNOSIS — Z20822 Contact with and (suspected) exposure to covid-19: Secondary | ICD-10-CM | POA: Diagnosis not present

## 2020-05-04 DIAGNOSIS — Z03818 Encounter for observation for suspected exposure to other biological agents ruled out: Secondary | ICD-10-CM | POA: Diagnosis not present

## 2020-05-05 ENCOUNTER — Other Ambulatory Visit: Payer: BLUE CROSS/BLUE SHIELD

## 2020-05-05 DIAGNOSIS — Z20822 Contact with and (suspected) exposure to covid-19: Secondary | ICD-10-CM | POA: Diagnosis not present

## 2020-05-07 LAB — SPECIMEN STATUS REPORT

## 2020-05-07 LAB — NOVEL CORONAVIRUS, NAA: SARS-CoV-2, NAA: NOT DETECTED

## 2020-05-07 LAB — SARS-COV-2, NAA 2 DAY TAT

## 2020-05-24 DIAGNOSIS — Z1159 Encounter for screening for other viral diseases: Secondary | ICD-10-CM | POA: Diagnosis not present

## 2020-05-29 DIAGNOSIS — D123 Benign neoplasm of transverse colon: Secondary | ICD-10-CM | POA: Diagnosis not present

## 2020-05-29 DIAGNOSIS — K317 Polyp of stomach and duodenum: Secondary | ICD-10-CM | POA: Diagnosis not present

## 2020-05-29 DIAGNOSIS — Z1509 Genetic susceptibility to other malignant neoplasm: Secondary | ICD-10-CM | POA: Diagnosis not present

## 2020-05-29 DIAGNOSIS — K2930 Chronic superficial gastritis without bleeding: Secondary | ICD-10-CM | POA: Diagnosis not present

## 2020-06-05 DIAGNOSIS — Z6839 Body mass index (BMI) 39.0-39.9, adult: Secondary | ICD-10-CM | POA: Diagnosis not present

## 2020-06-05 DIAGNOSIS — U071 COVID-19: Secondary | ICD-10-CM | POA: Diagnosis not present

## 2020-06-05 DIAGNOSIS — Z23 Encounter for immunization: Secondary | ICD-10-CM | POA: Diagnosis not present

## 2020-07-30 DIAGNOSIS — Z20828 Contact with and (suspected) exposure to other viral communicable diseases: Secondary | ICD-10-CM | POA: Diagnosis not present

## 2020-07-31 DIAGNOSIS — E7849 Other hyperlipidemia: Secondary | ICD-10-CM | POA: Diagnosis not present

## 2020-07-31 DIAGNOSIS — J329 Chronic sinusitis, unspecified: Secondary | ICD-10-CM | POA: Diagnosis not present

## 2020-07-31 DIAGNOSIS — E669 Obesity, unspecified: Secondary | ICD-10-CM | POA: Diagnosis not present

## 2020-08-02 DIAGNOSIS — G4733 Obstructive sleep apnea (adult) (pediatric): Secondary | ICD-10-CM | POA: Diagnosis not present

## 2020-08-04 DIAGNOSIS — Z20822 Contact with and (suspected) exposure to covid-19: Secondary | ICD-10-CM | POA: Diagnosis not present

## 2020-08-12 ENCOUNTER — Other Ambulatory Visit: Payer: BLUE CROSS/BLUE SHIELD

## 2020-08-12 DIAGNOSIS — Z20822 Contact with and (suspected) exposure to covid-19: Secondary | ICD-10-CM | POA: Diagnosis not present

## 2020-08-15 LAB — NOVEL CORONAVIRUS, NAA: SARS-CoV-2, NAA: NOT DETECTED

## 2020-12-23 ENCOUNTER — Encounter: Payer: Self-pay | Admitting: Neurology

## 2020-12-25 ENCOUNTER — Other Ambulatory Visit: Payer: Self-pay

## 2020-12-25 ENCOUNTER — Encounter: Payer: Self-pay | Admitting: Neurology

## 2020-12-25 ENCOUNTER — Ambulatory Visit: Payer: Commercial Managed Care - PPO | Admitting: Neurology

## 2020-12-25 VITALS — BP 128/78 | HR 68 | Ht 67.0 in | Wt 259.0 lb

## 2020-12-25 DIAGNOSIS — Z9989 Dependence on other enabling machines and devices: Secondary | ICD-10-CM

## 2020-12-25 DIAGNOSIS — G4733 Obstructive sleep apnea (adult) (pediatric): Secondary | ICD-10-CM | POA: Diagnosis not present

## 2020-12-25 NOTE — Progress Notes (Signed)
Subjective:    Patient ID: Jacob House is a 55 y.o. male.  HPI     Interim history:   Mr. Skoda is a 55 year old right-handed gentleman with an underlying medical history of carpal tunnel syndrome, shoulder pain, and obesity, who presents for followup consultation of his obstructive sleep apnea, treated with CPAP. He is unaccompanied today and presents for his yearly check up. I last saw him on 12/29/19, at which time he was fully compliant with his CPAP. 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 He was then fully vaccinated for Covid.  Today, 12/25/20: I reviewed his CPAP compliance data from 11/24/2020 through 12/23/2020, which is a total of 30 days, during which time he used his machine every night with percent use days greater than 4 hours at 97%, indicating excellent compliance with an average usage of 7 hours and 9 minutes, residual AHI at goal at 1.9/h, leak acceptable with some increase noted at times, 95th percentile of leak at 26.8 L/min on a pressure of 15 cm with EPR of 2.  I also reviewed his compliance data for the past calendar year, he was fully compliant with treatment.  We also furnished an updated letter of support so he can submit to the [place].  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  He found a new job in January 2022.  He would like to maintain his job with [employer] as well.  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 He reports not having very many healthy eating options and traveling by road, both of these resulted in some weight gain in the past few months.  He estimates that he has gained about 20 pounds.  He is working on weight loss and is hoping that once his training is completed and he has a steady job and [place], he will focus more on healthy eating  and cooking more for himself.  He has had no recent medication changes, he does have a physical pending with his primary care.  He continues to be compliant with his CPAP.  He has occasional mouth dryness.   The patient's allergies, current medications, family history, past medical history, past social history, past surgical history and problem list were reviewed and updated as appropriate.    Previously (copied from previous notes for reference):    I saw him on 12/21/2018 in a virtual visit, at which time he had started using a new CPAP machine and he was compliant with treatment.  He was advised to follow-up routinely in 1 year.   I reviewed his CPAP compliance data from the past year, during which time he was fully compliant.  I also reviewed his most recent compliance data from 11/29/2019, through 12/28/2019, which is a total of 30 days, during which time he used his machine every night with percent use days greater than 4 hours at 100%, indicating superb compliance with an average usage of 6 hours and 56 minutes, residual AHI at goal at 2.8/h, leak on the high side consistently with a 95th percentile at 38.6 L/min on a pressure of 15 cm with EPR of 2.       I saw him on 07/16/2018, at which time he was compliant with his CPAP and presented for a visit  to qualify for his new CPAP machine.  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 He did notice some odd noises coming from his CPAP machine and it was over 24 years old.   I reviewed his CPAP compliance data from 11/17/2018 through 12/16/2018 which is a total of 30 days, during which time he used his machine every night with percent used days greater than 4 hours at 100%, indicating superb compliance with an average usage of 7 hours and 1 minute, residual AHI at goal at 1.9/h, leak on the high side consistently with a 95th percentile at 43.6 L/min on a pressure of 15 cm with EPR of 2. I also reviewed his  compliance data from 08/09/2017 through 08/08/2018 which is a total of 1 year, during which time his percent used days greater than 4 hours is 98.1% altogether with an average AHI at 2.5/h, pressure at 15 cm so altogether he has been compliant without any lapses of treatment for the past year which is exemplary. Set up date on the new machine was 08/07/2018.    I saw him on 12/17/2017, at which time he was fully compliant with his previous CPAP machine. He was advised to follow-up routinely in one year.    He called in the interim reporting issues and abnormal sounds with his CPAP machine and he was eligible for a new CPAP machine.    I reviewed his CPAP compliance data from 06/16/2018 through 07/15/2018 which is a total of 30 days, during which time he used his CPAP machine every night with percent used days greater than 4 hours at 100%, indicating superb compliance with an average usage of 6 hours and 55 minutes, residual AHI at goal at 2.5 per hour, pressure at 15, leak on the lower end.    I saw him on 01/21/2017, at which time he was fully compliant with his CPAP. I gave him a prescription for travel CPAP machine. He reported doing well, he was working on his Merchandiser, retail and working on weight loss. I suggested a one-year checkup.   He requested a replacement prescription for his travel CPAP in the interim, which we provided, but he could not fill it until he has a face-to-face visit as I understand.   I reviewed his CPAP compliance data from the past 30 days from 11/17/2017 through 12/16/2017, during which time he used his CPAP every night with percent used days greater than 4 hours at 100%, indicating superb compliance with an average usage of 6 hours and 50 minutes, residual AHI at goal at 2.6 per hour, leak fluctuates, some days better than others, average leakage is acceptable. We also reviewed his compliance data for the last year, he missed 1 day and has been fully compliant, apnea  score at goal and leak on the low side.    I saw him on 11/28/2015, at which time he was doing well, he was fully compliant with his CPAP. I received his compliance data after the visit via fax from his DME company and he was under percent compliant at the time, he was trying to lose weight. 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   I reviewed his CPAP compliance data from 12/22/2016 through 01/20/2017 which is a total of 30 days, during which time he used his machine every night with percent used days greater than 4 hours at 100%,  average usage of 6 hours and 48 minutes, residual AHI 2.4 per hour, leak low generally speaking, pressure of 15 cm.    I saw him on 11/17/2014 at which time he reported full compliance with CPAP. I reviewed his pulse ox report which indicated that he was appropriately treated with CPAP only and did not need supplemental oxygen thankfully. He changed from a nasal mask to nasal pillows interface in February 2016 and like to it. He had some issues with low back pain.    I reviewed his CPAP compliance data from 10/29/15 to 11/27/15, which is a total of 30 days, during which time He used his machine 29 days with percent used days greater than 4 hours at 96.7%, indicating excellent compliance with an average usage of 6 hours and 16 minutes, residual AHI 3.4 per hour, pressure at 15 cm, leak low.    Addendum 11/17/2014: Later in the afternoon I received his CPAP compliance data from his DME company via fax. This was from 08/24/2014 through 09/22/2014 which is a total of 30 days during which time he used his machine every night with percent used days greater than 4 hours at 100%, indicating superb compliance with an average usage of 6 hours and 35 minutes. Residual AHI acceptable at 3.9 per hour on a pressure of 15 cm and leak low. This indicates superb compliance and of course supports his verbal report of full compliance.    I  saw him on 05/09/2014, at which time he reported compliance with treatment. He had some residual daytime somnolence and some issues with mask fitting. He was using a [place] on nasal mask. He was still not always fully rested in the mornings. He was given a prescription for Voltaren by his orthopedic doctor for his arthritis. He was trying to lose weight and had in fact lost some weight. I asked him to meet with his DME provider for mask refit. I also suggested we proceed with an overnight pulse oximetry test to ensure that his oxygen saturations were fine on treatment.   I reviewed the patient's overnight pulse oximetry report from 09/23/2014 while on CPAP therapy at night. His average oxygen saturation was 95.2%, total test time was 6 hours and 11 minutes. Lowest oxygen saturation was 86%. Time below 88% saturation was 28 seconds. Based on these test results, it appears that the patient is appropriately treated on room air with CPAP only and does not require additional supplemental oxygen at this time. Average pulse rate was 60.2 bpm, maximum was 99 bpm, minimum was 46 bpm during the testing time.    I reviewed his compliance data from 04/10/14 to 05/08/14, which is 30 days, during which time he was 100%, percent used days greater than 4 hours was 90%, indicating excellent compliance. Residual AHI was 3.1 per hour. Leak was at times quite high.    I saw him on 12/08/2013, at which time I advised him regarding his sleep study results and his compliance. He indicated using his CPAP regularly. There was no data on his SD card. His [employer] CPAP machine indicated that he had been using it every day for the previous 30 days. He reported sleeping well with CPAP and having adjusted fairly well to it. His sleepiness was improved. I asked him to continue using CPAP regularly.   I reviewed the patient's CPAP compliance data from 11/09/2013 to 12/08/2013, which is a total of 30 days, during which time the  patient used CPAP every day.  The average usage for all days was 6 hours and 39 minutes. The percent used days greater than 4 hours was 100 %, indicating superb compliance. The residual AHI was 5.6 per hour, indicating an adequate treatment pressure of 15 cwp. Air leak from the mask was low.   I first met him on 08/31/2013, at which time he reported snoring and excessive daytime somnolence as well as witnessed apneas. I requested he return for sleep study. He had a split-night sleep study on 10/06/2013: Baseline sleep efficiency was significantly reduced at 45.5% with a long sleep latency of 116.5 minutes and wake after sleep onset of 34 minutes with mild sleep fragmentation noted. He had an elevated arousal index. He had markedly increased percentage of stage II sleep, near absence of slow-wave sleep, and absence of REM sleep prior to CPAP. He had no clinically significant periodic leg movements or EKG changes. He had moderate to loud snoring. His total AHI was 57.8 per hour. Baseline oxygen saturation was 91%, nadir was 67%. He was then titrated on CPAP with sleep efficiency improved at 73.3% with a latency to sleep of 50.5 minutes and wake after sleep onset of 3.5 minutes with mild sleep fragmentation noted. His arousal index was normal. He had a increased percentage of stage II sleep, near absence of slow-wave sleep and an increased percentage of REM sleep at 34.4%. Average oxygen saturation was improved at 93%, nadir was 70% which was during the initial titration period. On the final pressure of 14 cm his O2 nadir was 91% but he did not achieve REM sleep on the final pressure. He was titrated from 4-14 cm with reduction of the AHI of 6.2 events per hour at the final pressure. Based on the test results I did start the patient on CPAP at a pressure of 15 cm as he had a residual AHI of over 5/h on the final pressure of 14 cm during the study.     I reviewed his compliance data from 10/26/2013 through 11/07/2013  which is a total of 13 days during which time he was CPAP every night. Percent used days greater than 4 hours was 100%, average usage was 7 hours and 17 minutes. Residual AHI was 8 per hour on a pressure of 15 cm with no significant leak.   His Past Medical History Is Significant For: Past Medical History:  Diagnosis Date  . Allergy   . Carpal tunnel syndrome   . Complication of anesthesia 04-23-2013   pt told by anesthesia after probably has sleep apnea  . Complication of anesthesia    cannot lie on left side due to recent shoulder surgery per pt  . GERD (gastroesophageal reflux disease)   . OSA on CPAP 12/08/2013  . Snoring     His Past Surgical History Is Significant For: Past Surgical History:  Procedure Laterality Date  . ANAL FISSURE REPAIR  july 2009 and yrs ago   x 2   . CHOLECYSTECTOMY  1999  . COLONOSCOPY WITH PROPOFOL N/A 07/22/2013   Procedure: COLONOSCOPY WITH PROPOFOL;  Surgeon: Cleotis Nipper, MD;  Location: [facility];  Service: Endoscopy;  Laterality: N/A;  . ESOPHAGOGASTRODUODENOSCOPY (EGD) WITH PROPOFOL N/A 07/22/2013   Procedure: ESOPHAGOGASTRODUODENOSCOPY (EGD) WITH PROPOFOL;  Surgeon: Cleotis Nipper, MD;  Location: [facility];  Service: Endoscopy;  Laterality: N/A;  . EYE SURGERY Bilateral 2005   lasix  . FRACTURE SURGERY Right    foot  . GALLBLADDER SURGERY    . SHOULDER SURGERY  Left sept 2014   cartlidge repair    His Family History Is Significant For: Family History  Problem Relation Age of Onset  . Cancer Mother 2       colon, stomach  . Cancer Father        testicular  . Cancer Maternal Grandmother        Colon and Breast cancer  . Cancer Maternal Grandfather        Colon cancer  . Cancer Maternal Uncle        colon  . Cancer Maternal Uncle        colon  . Heart disease Maternal Uncle     His Social History Is Significant For: Social History   Socioeconomic History  . Marital status: Significant Other    Spouse name: Juliann Pulse  .  Number of children: 0  . Years of education: 30  . Highest education level: Not on file  Occupational History  . Occupation: IT sales professional: [employer]  Tobacco Use  . Smoking status: Never Smoker  . Smokeless tobacco: Never Used  Substance and Sexual Activity  . Alcohol use: Yes    Alcohol/week: 0.0 - 1.0 standard drinks    Comment: rare  . Drug use: No  . Sexual activity: Yes    Partners: Female    Birth control/protection: Condom    Comment: 1 sexual partner in last year  Other Topics Concern  . Not on file  Social History Narrative   Lives with his significant other, Juliann Pulse, and their cats.   Social Determinants of Health   Financial Resource Strain: Not on file  Food Insecurity: Not on file  Transportation Needs: Not on file  Physical Activity: Not on file  Stress: Not on file  Social Connections: Not on file    His Allergies Are:  No Known Allergies:   His Current Medications Are:  Outpatient Encounter Medications as of 12/25/2020  Medication Sig  . fluticasone (FLONASE) 50 MCG/ACT nasal spray Place 2 sprays into both nostrils as needed.  . [DISCONTINUED] metoprolol tartrate (LOPRESSOR) 50 MG tablet Take 1 tablet by mouth once for procedure. (Patient not taking: Reported on 12/25/2020)   No facility-administered encounter medications on file as of 12/25/2020.  :  Review of Systems:  Out of a complete 14 point review of systems, all are reviewed and negative with the exception of these symptoms as listed below:  Review of Systems  Neurological:       Here for yearly f/u for CPAP. Reports he is doing well. No concerns other than some times he will wake up with a dry mouth.     Objective:  Neurological Exam  Physical Exam Physical Examination:   Vitals:   12/25/20 0924  BP: 128/78  Pulse: 68    General Examination: The patient is a very pleasant 55 y.o. male in no acute distress. He appears well-developed and well-nourished and well  groomed.    HEENT:Extraocular tracking is well-preserved.  Hearing is grossly intact. Face is symmetric with normal facial animation and normal facial sensation. Speech is clear with no dysarthria noted. There is no hypophonia. There is no lip, neck/head, jaw or voice tremor. [place]ks[place]s FROM.Oropharynx exam reveals:no obvious change, mild mouth dryness, tongue protrudes centrally and palate elevates symmetrically.  Chest:Clear to auscultation without wheezing, rhonchi or crackles noted.  Heart:S1+S2+0, regular and normal without murmurs, rubs or gallops noted.   Abdomen:Soft, non-tender and non-distended.  Extremities:There is trace pitting edema in  the distal lower extremities bilaterally.   Skin: Warm and dry without trophic changes noted.   Musculoskeletal: exam reveals no obvious joint deformities, tenderness or joint swelling or erythema.   Neurologically:  Mental status: The patient is awake, alert and oriented in all 4 spheres. His memory, attention, language and knowledge are appropriate. There is no aphasia, agnosia, apraxia or anomia. Speech is clear with normal prosody and enunciation. Thought process is linear. Mood is congruent and affect is normal.  Cranial nerves are as described above under HEENT exam.  Motor exam: Normal bulk, strength and tone is noted. There is no tremor. Fine motor skills are intactgrossly.  Cerebellar testing shows no dysmetria or intention tremor. There is no truncal or gait ataxia.  Sensory exam is intact to light touch in the upper and lower extremities.  Gait, station and balance are unremarkable.   Assessment and Plan:   In summary, Jacob House is a very pleasant 55 year old male with an underlying medical history of carpal tunnel syndrome, arthritis and obesity, who presents for followup consultation of his obstructive sleep apnea, welltreated with CPAP of 15 cm with ongoing good benefit reported and excellent compliance.  He  has recovered from having COVID in January 2021, he is fully vaccinated.  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  He is currently receiving training in [place].  He is advised to continue with his CPAP at the current settings but we did talk about weight gain and the importance of maintaining healthy weight.  He is motivated to continue with his CPAP and also working on weight loss.  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  He has been compliant for the past calendar year.  He is commended for his treatment adherence. He had a split-night sleep study in March 2015.He was able to get a new CPAP machine in 2020. He is training to become a Programme researcher, broadcasting/film/video; he is a International aid/development worker. He is advised to follow-up routinely in 1 year, sooner if needed.  I answered all his questions today and he was in agreement.   I spent 22 minutes in total face-to-face time and in reviewing records during pre-charting, more than 50% of which was spent in counseling and coordination of care, reviewing test results, reviewing medications and treatment regimen and/or in discussing or reviewing the diagnosis of OSA on CPAP, the prognosis and treatment options. Pertinent laboratory and imaging test results that were available during this visit with the patient were reviewed by me and considered in my medical decision making (see chart for details).

## 2020-12-25 NOTE — Patient Instructions (Signed)
It was good to see you again today.  I am glad to hear that you have found a new job.  We will provide a letter of support so you can submit to the [facility].  You are compliant with your CPAP, please continue to work on weight loss as you have had some weight gain.  Please also talk to your primary care physician about the swelling we noticed in the legs.  Please continue using your CPAP regularly. While your insurance requires that you use CPAP at least 4 hours each night on 70% of the nights, I recommend, that you not skip any nights and use it throughout the night if you can. Getting used to CPAP and staying with the treatment long term does take time and patience and discipline. Untreated obstructive sleep apnea when it is moderate to severe can have an adverse impact on cardiovascular health and raise her risk for heart disease, arrhythmias, hypertension, congestive heart failure, stroke and diabetes. Untreated obstructive sleep apnea causes sleep disruption, nonrestorative sleep, and sleep deprivation. This can have an impact on your day to day functioning and cause daytime sleepiness and impairment of cognitive function, memory loss, mood disturbance, and problems focussing. Using CPAP regularly can improve these symptoms.  Please follow-up routinely in 1 year, sooner if needed.

## 2021-01-02 ENCOUNTER — Ambulatory Visit: Payer: Self-pay | Admitting: Neurology

## 2021-01-02 ENCOUNTER — Ambulatory Visit: Payer: BLUE CROSS/BLUE SHIELD | Admitting: Neurology

## 2021-03-26 ENCOUNTER — Telehealth: Payer: Self-pay | Admitting: Licensed Clinical Social Worker

## 2021-03-26 NOTE — Telephone Encounter (Signed)
Mr. Nanda returned my call and has been scheduled to see Faith Rogue on 9/12 at 2pm. Pt aware to arrive 15 minutes early.

## 2021-04-12 ENCOUNTER — Ambulatory Visit (HOSPITAL_COMMUNITY): Payer: Commercial Managed Care - PPO

## 2021-04-16 ENCOUNTER — Encounter: Payer: BLUE CROSS/BLUE SHIELD | Admitting: Licensed Clinical Social Worker

## 2021-04-16 ENCOUNTER — Other Ambulatory Visit: Payer: BLUE CROSS/BLUE SHIELD

## 2021-06-04 ENCOUNTER — Inpatient Hospital Stay: Payer: Commercial Managed Care - PPO | Attending: Genetic Counselor | Admitting: Genetic Counselor

## 2021-06-04 ENCOUNTER — Other Ambulatory Visit: Payer: Self-pay

## 2021-06-04 ENCOUNTER — Inpatient Hospital Stay: Payer: Commercial Managed Care - PPO

## 2021-06-04 DIAGNOSIS — Z800 Family history of malignant neoplasm of digestive organs: Secondary | ICD-10-CM | POA: Diagnosis not present

## 2021-06-04 LAB — GENETIC SCREENING ORDER

## 2021-06-05 ENCOUNTER — Encounter: Payer: Self-pay | Admitting: Genetic Counselor

## 2021-06-05 DIAGNOSIS — Z800 Family history of malignant neoplasm of digestive organs: Secondary | ICD-10-CM

## 2021-06-05 HISTORY — DX: Family history of malignant neoplasm of digestive organs: Z80.0

## 2021-06-05 NOTE — Progress Notes (Signed)
REFERRING PROVIDER: Ronald Lobo, MD [address] [place],  [place] [address]  PRIMARY PROVIDER:  Sharilyn Sites, MD  PRIMARY REASON FOR VISIT:  1. Family history of Lynch syndrome   2. Family history of colon cancer     HISTORY OF PRESENT ILLNESS:   Jacob House, a 55 y.o. male, was seen for a [facility] cancer genetics consultation at the request of Dr. Cristina Gong due to a family history of colon cancer and a family history of Lynch syndrome.  Jacob House presents to clinic today to discuss the possibility of a hereditary predisposition to cancer, to discuss genetic testing, and to further clarify his future cancer risks, as well as potential cancer risks for family members.   Jacob House is a 55 y.o. male with no personal history of cancer.    CANCER HISTORY:  Oncology History   No history exists.    RISK FACTORS:  Colonoscopy: yes; history of at least two tubular adenomas Prostate cancer screening: DRE; no PSA at this time   Past Medical History:  Diagnosis Date   Allergy    Carpal tunnel syndrome    Complication of anesthesia 04-23-2013   pt told by anesthesia after probably has sleep apnea   Complication of anesthesia    cannot lie on left side due to recent shoulder surgery per pt   Family history of colon cancer 06/05/2021   Family history of Lynch syndrome 06/05/2021   GERD (gastroesophageal reflux disease)    OSA on CPAP 12/08/2013   Snoring     Past Surgical History:  Procedure Laterality Date   ANAL FISSURE REPAIR  july 2009 and yrs ago   x 2    CHOLECYSTECTOMY  1999   COLONOSCOPY WITH PROPOFOL N/A 07/22/2013   Procedure: COLONOSCOPY WITH PROPOFOL;  Surgeon: Cleotis Nipper, MD;  Location: [facility];  Service: Endoscopy;  Laterality: N/A;   ESOPHAGOGASTRODUODENOSCOPY (EGD) WITH PROPOFOL N/A 07/22/2013   Procedure: ESOPHAGOGASTRODUODENOSCOPY (EGD) WITH PROPOFOL;  Surgeon: Cleotis Nipper, MD;  Location: [facility];  Service: Endoscopy;  Laterality:  N/A;   EYE SURGERY Bilateral 2005   lasix   FRACTURE SURGERY Right    foot   GALLBLADDER SURGERY     SHOULDER SURGERY Left sept 2014   cartlidge repair    FAMILY HISTORY:  We obtained a detailed, 4-generation family history.  Significant diagnoses are listed below: Family History  Problem Relation Age of Onset   Colon cancer Mother 5   Testicular cancer Father        d. 67   Colon cancer Maternal Uncle        d. 38   Colon cancer Maternal Uncle        dx 22s; Lynch syndrome   Colon cancer Maternal Grandmother        dx before 73   Breast cancer Maternal Grandmother        dx 50s   Colon cancer Maternal Grandfather        dx before 30     Jacob House reports Lynch syndrome in his maternal uncle and two of his maternal uncle's children.  No report was available for review today. Jacob House is unaware of previous family history of genetic testing for hereditary cancer risks. Patient's maternal and paternal ancestors are of [place] descent. There is no reported Ashkenazi Jewish ancestry. There is no known consanguinity.  GENETIC COUNSELING ASSESSMENT: Jacob House is a 55 y.o. male with a family history of a  known hereditary cancer syndrome. We, therefore, discussed and recommended the following at today's visit.   DISCUSSION: We discussed that 5 - 10% of cancer is hereditary, with most cases of hereditary colon cancer associated with mutations in the Lynch syndrome genes.  We reviewed the cancer risks and management strategies associated with Lynch syndrome.  There are other genes that can be associated with hereditary colon cancer syndromes.  Type of cancer risk and level of risk are gene-specific.  We discussed that testing is beneficial for several reasons, including knowing about other cancer risks, identifying potential screening and risk-reduction options that may be appropriate, and to understanding if other family members could be at risk for cancer and allowing them to undergo  genetic testing.  We reviewed the characteristics, features and inheritance patterns of hereditary cancer syndromes. We also discussed genetic testing, including the appropriate family members to test, the process of testing, insurance coverage and turn-around-time for results. We discussed the implications of a negative, positive, carrier and/or variant of uncertain significant result. We recommended Jacob House pursue genetic testing for a panel that contains genes associated with colon cancer.  Jacob House was offered a common hereditary cancer panel (47 genes) and an expanded pan-cancer panel (84 genes). Jacob House was informed of the benefits and limitations of each panel, including that expanded pan-cancer panels contain several genes that do not have clear management guidelines at this point in time.  We also discussed that as the number of genes included on a panel increases, the chances of variants of uncertain significance increases.  After considering the benefits and limitations of each gene panel, Jacob House elected to have an expanded pan cancer panel through [facility].   The Multi-Cancer + RNA Panel offered by [facility] includes sequencing and/or deletion/duplication analysis of the following 84 genes:  AIP*, ALK, APC*, ATM*, AXIN2*, BAP1*, BARD1*, BLM*, BMPR1A*, BRCA1*, BRCA2*, BRIP1*, CASR, CDC73*, CDH1*, CDK4, CDKN1B*, CDKN1C*, CDKN2A, CEBPA, CHEK2*, CTNNA1*, DICER1*, DIS3L2*, EGFR, EPCAM, FH*, FLCN*, GATA2*, GPC3, GREM1, HOXB13, HRAS, KIT, MAX*, MEN1*, MET, MITF, MLH1*, MSH2*, MSH3*, MSH6*, MUTYH*, NBN*, NF1*, NF2*, NTHL1*, PALB2*, PDGFRA, PHOX2B, PMS2*, POLD1*, POLE*, POT1*, PRKAR1A*, PTCH1*, PTEN*, RAD50*, RAD51C*, RAD51D*, RB1*, RECQL4, RET, RUNX1*, SDHA*, SDHAF2*, SDHB*, SDHC*, SDHD*, SMAD4*, SMARCA4*, SMARCB1*, SMARCE1*, STK11*, SUFU*, TERC, TERT, TMEM127*, Tp53*, TSC1*, TSC2*, VHL*, WRN*, and WT1.  RNA analysis is performed for * genes.  Based on Jacob House's family history of cancer, he meets  medical criteria for genetic testing. Despite that he meets criteria, he may still have an out of pocket cost. We discussed that if he has an out of pocket cost associated with testing, the laboratory should reach out to him to discuss self-pay options and/or patient pay assistance programs.   We discussed that some people do not want to undergo genetic testing due to fear of genetic discrimination.  A federal law called the Genetic Information Non-Discrimination Act (GINA) of 2008 helps protect individuals against genetic discrimination based on their genetic test results.  It impacts both health insurance and employment.  With health insurance, it protects against increased premiums, being kicked off insurance or being forced to take a test in order to be insured.  For employment it protects against hiring, firing and promoting decisions based on genetic test results.  GINA does not apply to those in the [employer], those who work for companies with less than 15 employees, and new life insurance or long-term disability insurance policies.  Health status due to a cancer diagnosis is not protected  under GINA.  PLAN: After considering the risks, benefits, and limitations, Jacob House provided informed consent to pursue genetic testing and the blood sample was sent to [facility] for analysis of the Multi-Cancer +RNA. Results should be available within approximately 3 weeks' time, at which point they will be disclosed by telephone to Jacob House, as will any additional recommendations warranted by these results. Jacob House will receive a summary of his genetic counseling visit and a copy of his results once available. This information will also be available in Epic.   Lastly, we encouraged Jacob House to remain in contact with cancer genetics annually so that we can continuously update the family history and inform him of any changes in cancer genetics and testing that may be of benefit for this family.   Mr.  House questions were answered to his satisfaction today. Our contact information was provided should additional questions or concerns arise. Thank you for the referral and allowing [place] to share in the care of your patient.   Brean Carberry M. Joette Catching, [place], [facility] Genetic Counselor [email or web address] (P) [phone]   The patient was seen for a total of 40 minutes in face-to-face genetic counseling.  The patient was seen alone.  Drs. Magrinat, Lindi Adie and/or Burr Medico were available to discuss this case as needed.  _______________________________________________________________________ For Office Staff:  Number of people involved in session: 1 Was an Intern/ student involved with case: no  .

## 2021-06-26 ENCOUNTER — Telehealth: Payer: Self-pay | Admitting: Genetic Counselor

## 2021-06-26 ENCOUNTER — Ambulatory Visit: Payer: Self-pay | Admitting: Genetic Counselor

## 2021-06-26 ENCOUNTER — Encounter: Payer: Self-pay | Admitting: Genetic Counselor

## 2021-06-26 DIAGNOSIS — Z1379 Encounter for other screening for genetic and chromosomal anomalies: Secondary | ICD-10-CM

## 2021-06-26 DIAGNOSIS — Z800 Family history of malignant neoplasm of digestive organs: Secondary | ICD-10-CM

## 2021-06-26 DIAGNOSIS — Z1509 Genetic susceptibility to other malignant neoplasm: Secondary | ICD-10-CM

## 2021-06-26 NOTE — Telephone Encounter (Signed)
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  Dicussed Lynch syndrome cancer risks and management.

## 2021-07-03 NOTE — Progress Notes (Signed)
REFERRING PROVIDER: Ronald Lobo, MD  PRIMARY PROVIDER:  Sharilyn Sites, MD  PRIMARY REASON FOR VISIT:  1. Lynch syndrome   2. Genetic testing   3. Family history of Lynch syndrome   4. Family history of colon cancer    GENETIC TEST RESULTS  Patient Name: Jacob House Patient Age: 55 y.o. Encounter Date: 06/26/2021  HPI: Jacob House was previously seen in the [place] clinic due to a family of cancer and concerns regarding a hereditary predisposition to cancer. Please refer to our prior cancer genetics clinic note for more information regarding Jacob House's medical, social and family histories, and our assessment and recommendations, at the time. Jacob House recent genetic test results were disclosed to her, as were recommendations warranted by these results. These results and recommendations are discussed in more detail below.   FAMILY HISTORY:  We obtained a detailed, 4-generation family history.  Significant diagnoses are listed below: Family History  Problem Relation Age of Onset   Colon cancer Mother 53   Testicular cancer Father        d. 39   Colon cancer Maternal Uncle        d. 7   Colon cancer Maternal Uncle        dx 6s; Lynch syndrome   Colon cancer Maternal Grandmother        dx before 75   Breast cancer Maternal Grandmother        dx 74s   Colon cancer Maternal Grandfather        dx before 16     Jacob House reports Lynch syndrome in his maternal uncle and two of his maternal uncle's children.  No report was available for review today. Jacob House is unaware of previous family history of genetic testing for hereditary cancer risks. Patient's maternal and paternal ancestors are of [place] descent. There is no reported Ashkenazi Jewish ancestry. There is no known consanguinity.  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 There were no deleterious mutations detected in other genes.   The Multi-Cancer + RNA Panel offered by [facility] includes sequencing and/or deletion/duplication analysis of the following 84 genes:  AIP*, ALK, APC*, ATM*, AXIN2*, BAP1*, BARD1*, BLM*, BMPR1A*, BRCA1*, BRCA2*, BRIP1*, CASR, CDC73*, CDH1*, CDK4, CDKN1B*, CDKN1C*, CDKN2A, CEBPA, CHEK2*, CTNNA1*, DICER1*, DIS3L2*, EGFR, EPCAM, FH*, FLCN*, GATA2*, GPC3, GREM1, HOXB13, HRAS, KIT, MAX*, MEN1*, MET, MITF, MLH1*, MSH2*, MSH3*, MSH6*, MUTYH*, NBN*, NF1*, NF2*, NTHL1*, PALB2*, PDGFRA, PHOX2B, PMS2*, POLD1*, POLE*, POT1*, PRKAR1A*, PTCH1*, PTEN*, RAD50*, RAD51C*, RAD51D*, RB1*, RECQL4, RET, RUNX1*, SDHA*, SDHAF2*, SDHB*, SDHC*, SDHD*, SMAD4*, SMARCA4*, SMARCB1*, SMARCE1*, STK11*, SUFU*, TERC, TERT, TMEM127*, Tp53*, TSC1*, TSC2*, VHL*, WRN*, and WT1.  RNA analysis is performed for * genes.   A copy of the test report has been scanned into Epic for review and is included below for reference.      Four variants of uncertain significance were detected in the the following genes BRCA1 at c.1571C>T (p.Ala524Val), HOXB13 at c.198C>A (p.Cys66*), MSH3 at c.3382A>G (p.Met1128Val), and NTHL1 at c.200C>T (p.Ala67Val).  At this time, it is unknown if these variants are associated with an increased risk for cancer or if they are benign, but most uncertain variants are reclassified to benign. It should not be used to make medical management decisions. With time, we suspect the laboratory will determine the significance of this variant, if any. If the laboratory reclassifies this variant, we  will attempt to contact Jacob House to discuss it further.    CANCER RISKS & SCREENING RECOMMENDATIONS:  We discussed the implications of Lynch syndrome for Jacob House and discussed who else in the family should have genetic testing. Individuals with Lynch syndrome have an increased risk for colon cancer and endometrial cancer, as well as other types of  cancers. The cancer risks associated with the MSH2 gene are currently known to include: Colon cancer: 33-52% Endometrial cancer: 21-57% Ovarian cancer: 8-38% Renal pelvis and/or ureter: 2.2-28% Bladder: 4.4-12.8% Gastric: 0.2-9.0% Small bowel: 1.1-10% Biliary tract: 0.02-1.7% Prostate: 3.9-23.8% Brain: 2.5-7.7% Increased risk for malignant and benign skin tumors such as sebaceous adenocarcinomas, sebaceous adenomas, and keratoacanthomas   Additional screening and risk-reduction options have been identified to be appropriate for individuals with Lynch syndrome to manage these risks.  We recommended Jacob House consider following management guidelines for Lynch syndrome; all of which are outlined below. These can be coordinated by Jacob House's gastroenterologist and primary care provider.     Surveillance/prevention strategies outlined by the NCCN guidelines (v1.2022) for individuals (both males and females) with MSH2-related Lynch syndrome: High-quality colonoscopy every 1-2 y, beginning at age 103-25 or 2-5 y prior to the earliest colon cancer if it is diagnosed before age 109 y. Consider the use of daily aspirin to decrease CRC risk. The decision to use aspirin in Lynch syndrome should be made on an individualized basis including a discussion of dose, benefits, and adverse effects.  While there is no clear evidence to support screening for stomach and small bowel cancer, an upper endoscopy can be considered at 2-4 year intervals beginning at age 35-40 y. However, whether to have this screening is best determined by a gastroenterologist.  There is no clear evidence to support screening for urothelial cancers in Lynch syndrome, although annual urinalysis starting at age 33-35 y may be considered for those with a family history of urothelial cancer. Individuals with MSH2 pathogenic variants (especially males) appear to be at higher risk. Patients should be educated regarding signs and symptoms of  neurologic cancer and the importance of prompt reporting of abnormal symptoms to their physicians. For males, consider beginning shared decision-making about prostate cancer screening at age 43 years and consider screening at annual intervals rather than every other year.  Consider skin exam every 1-2 years.  Age to start surveillance is uncertain and can be personalized.    For females with Lynch syndrome:  Females should seek medical attention if they experience abnormal vaginal bleeding or symptoms of ovarian cancer (pelvic or abdominal pain, bloating, increased abdominal girth, difficulty eating, etc.) Some providers may still recommend vaginal ultrasounds, uterine biopsies (for uterine cancer risk) and/or CA-125 analysis (for ovarian cancer risk), even though these have not been shown to be effective. A hysterectomy with removal of the ovaries and fallopian tubes could be considered once childbearing is completed (if planned).   FAMILY MEMBERS: Since we now know the mutation in Jacob House, we can test at-risk relatives to determine whether or not they have inherited the mutation and are at increased risk for cancer.  It is important that all of Jacob House's relatives (both males and females) know of the presence of this gene mutation. Site-specific genetic testing can sort out who in the family is at risk and who is not. We will be happy to meet with any of the family members or refer them to a genetic counselor in their local area. To locate genetic counselors in other cities,  individuals can visit the website of the [employer] (Artist[employer].se) and search for a counselor by zip code.   Children who inherit two mutations in the same Lynch gene, one mutation from each parent, are at-risk of a rare recessive condition called constitutional mismatch repair deficiency (CMMR-D) syndrome. If family members have this mutation, they may wish to have their partner tested for  reproductive planning purposes.   PLAN: Jacob House will need to be followed as high risk based on his diagnosis of Lynch syndrome.  Jacob House plans to continue to follow up with his gastroenterologist, Dr. Ronald House.  Jacob House is aware to discuss other screening listed above with his PCP, Dr. Sharilyn Sites.   RESOURCES:  [employer] (Facing Our Risk of Cancer Empowered) is a resource for those with a hereditary predisposition to develop cancer.  [employer] provides information about risk reduction, advocacy, legislation, and clinical trials.  Additionally, [employer] provides a platform for collaboration and support; which includes: peer navigation, message boards, local support groups, a toll-free helpline, research registry and recruitment, advocate training, published medical research, webinars, brochures, mastectomy photos, and more.  For more information, visit www.facingourrisk.org  We strongly encouraged Mr. Micheletti to remain in contact with [place] in cancer genetics on an annual basis so we can update Mr. Chason personal and family histories, and inform him of advances in cancer genetics that may be of benefit for the entire family. Mr. Eastham knows he is also welcome to call with any questions or concerns, at any time.    Donnia Poplaski M. Joette Catching, [place], [facility] Certified Genetic Counselor [email or web address] (P) [phone]

## 2021-07-04 ENCOUNTER — Encounter: Payer: Self-pay | Admitting: Genetic Counselor

## 2021-10-01 ENCOUNTER — Encounter: Payer: Self-pay | Admitting: Emergency Medicine

## 2021-10-01 ENCOUNTER — Other Ambulatory Visit: Payer: Self-pay

## 2021-10-01 ENCOUNTER — Ambulatory Visit (HOSPITAL_COMMUNITY): Admission: EM | Admit: 2021-10-01 | Discharge status: Absent without leave | Payer: Commercial Managed Care - PPO

## 2021-10-01 ENCOUNTER — Ambulatory Visit
Admission: EM | Admit: 2021-10-01 | Discharge: 2021-10-01 | Disposition: A | Discharge status: Home / Self Care | Payer: Commercial Managed Care - PPO

## 2021-10-01 DIAGNOSIS — G8929 Other chronic pain: Secondary | ICD-10-CM

## 2021-10-01 DIAGNOSIS — M25561 Pain in right knee: Secondary | ICD-10-CM | POA: Diagnosis not present

## 2021-10-01 MED ORDER — DICLOFENAC SODIUM 1 % EX GEL: 4.0000 g | Freq: Four times a day (QID) | CUTANEOUS | 0 refills | Status: DC

## 2021-10-01 NOTE — ED Provider Notes (Signed)
[facility]    CSN: [id number] Arrival date & time: 10/01/21  1514    HISTORY   Chief Complaint  Patient presents with   Leg Pain   HPI Jacob House is a 56 y.o. male. Patient reports a history of osteoarthritis in his left knee with bone spurs, he has noticed that his right knee has been bothering him more lately.  Patient states that yesterday at work he had to climb up and down stairs repeatedly throughout the day, states this caused the back of his right knee to become very sore, states that this time is tender to palpation and is having pain with walking and climbing stairs.  Patient states the area is not hot or particularly swollen.  Patient states that he has been prescribed Celebrex for his left knee in the past, has not been taking it because he has not needed it, states he took a dose last night and that it helped a little bit.  Patient politely declines imaging of his right knee today.  The history is provided by the patient.  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   Family history of colon cancer 06/05/2021   Family history of Lynch syndrome 06/05/2021   Acute paronychia of toe of right foot 02/24/2018   OSA on CPAP 12/08/2013   OSA on CPAP 12/08/2013   Colon polyps 08/06/2012   Carpal tunnel syndrome 06/13/2012   Past Surgical History:  Procedure Laterality Date   ANAL FISSURE REPAIR  july 2009 and yrs ago   x 2    CHOLECYSTECTOMY  1999   COLONOSCOPY  WITH PROPOFOL N/A 07/22/2013   Procedure: COLONOSCOPY WITH PROPOFOL;  Surgeon: Cleotis Nipper, MD;  Location: [facility];  Service: Endoscopy;  Laterality: N/A;   ESOPHAGOGASTRODUODENOSCOPY (EGD) WITH PROPOFOL N/A 07/22/2013   Procedure: ESOPHAGOGASTRODUODENOSCOPY (EGD) WITH PROPOFOL;  Surgeon: Cleotis Nipper, MD;  Location: [facility];  Service: Endoscopy;  Laterality: N/A;   EYE SURGERY Bilateral 2005   lasix   FRACTURE SURGERY Right    foot   GALLBLADDER SURGERY     SHOULDER SURGERY Left sept 2014   cartlidge repair    Home Medications    Prior to Admission medications   Medication Sig Start Date End Date Taking? Authorizing Provider    Family History Family History  Problem Relation Age of Onset   Colon cancer Mother 40   Testicular cancer Father        d. 37   Colon cancer Maternal Uncle        d. 72   Colon cancer Maternal Uncle        dx 17s; Lynch syndrome   Heart disease Maternal Uncle    Colon cancer Maternal Grandmother        dx before 16   Breast cancer Maternal Grandmother        dx 28s   Colon cancer Maternal  Grandfather        dx before 39   Social History Social History   Tobacco Use   Smoking status: Never   Smokeless tobacco: Never  Substance Use Topics   Alcohol use: Yes    Alcohol/week: 0.0 - 1.0 standard drinks    Comment: rare   Drug use: No   Allergies   Patient has no known allergies.  Review of Systems Review of Systems Pertinent findings noted in history of present illness.   Physical Exam Triage Vital Signs ED Triage Vitals  Enc Vitals Group     BP 06/01/21 0827 (!) 147/82     Pulse Rate 06/01/21 0827 72     Resp 06/01/21 0827 18     Temp 06/01/21 0827 98.3 F (36.8 C)     Temp Source 06/01/21 0827 Oral     SpO2 06/01/21 0827 98 %     Weight --      Height --      Head Circumference --      Peak Flow --      Pain Score 06/01/21 0826 5     Pain Loc --      Pain Edu? --      Excl. in [place]? --   No data  found.  Updated Vital Signs BP 123/73 (BP Location: Right Arm)    Pulse (!) 56    Temp 97.9 F (36.6 C) (Oral)    Resp 18    SpO2 97%   Physical Exam Vitals and nursing note reviewed.  Constitutional:      General: He is not in acute distress.    Appearance: Normal appearance. He is not ill-appearing.  HENT:     Head: Normocephalic and atraumatic.  Eyes:     General: Lids are normal.        Right eye: No discharge.        Left eye: No discharge.     Extraocular Movements: Extraocular movements intact.     Conjunctiva/sclera: Conjunctivae normal.     Right eye: Right conjunctiva is not injected.     Left eye: Left conjunctiva is not injected.  Neck:     Trachea: Trachea and phonation normal.  Cardiovascular:     Rate and Rhythm: Normal rate and regular rhythm.     Pulses: Normal pulses.     Heart sounds: Normal heart sounds. No murmur heard.   No friction rub. No gallop.  Pulmonary:     Effort: Pulmonary effort is normal. No accessory muscle usage, prolonged expiration or respiratory distress.     Breath sounds: Normal breath sounds. No stridor, decreased air movement or transmitted upper airway sounds. No decreased breath sounds, wheezing, rhonchi or rales.  Chest:     Chest wall: No tenderness.  Musculoskeletal:        General: Tenderness (Posterior aspect of right knee) present. Normal range of motion.     Cervical back: Normal range of motion and neck supple. Normal range of motion.  Lymphadenopathy:     Cervical: No cervical adenopathy.  Skin:    General: Skin is warm and dry.     Findings: No erythema or rash.  Neurological:     General: No focal deficit present.     Mental Status: He is alert and oriented to person, place, and time.  Psychiatric:        Mood and Affect: Mood normal.        Behavior: Behavior normal.  Visual Acuity Right Eye Distance:   Left Eye Distance:   Bilateral Distance:    Right Eye Near:   Left Eye Near:    Bilateral Near:      UC Couse / Diagnostics / Procedures:    EKG  Radiology No results found.  Procedures Procedures (including critical care time)  UC Diagnoses / Final Clinical Impressions(s)   I have reviewed the triage vital signs and the nursing notes.  Pertinent labs & imaging results that were available during my care of the patient were reviewed by me and considered in my medical decision making (see chart for details).    Final diagnoses:  Chronic pain of right knee   Patient advised rest, ice and elevation.  Patient also advised she can try Voltaren gel topically 4 times daily for anti-inflammatory pain relief.  Patient advised to continue Celebrex as it works better when taken daily and not intermittently.  Patient provided with a note to be out of work.  ED Prescriptions     Medication Sig Dispense Auth. Provider   diclofenac Sodium (VOLTAREN) 1 % GEL Apply 4 g topically 4 (four) times daily. 350 g Lynden Oxford Scales, PA-C      PDMP not reviewed this encounter.  Pending results:  Labs Reviewed - No data to display  Medications Ordered in UC: Medications - No data to display  Disposition Upon Discharge:  Condition: stable for discharge home Home: take medications as prescribed; routine discharge instructions as discussed; follow up as advised.  Patient presented with an acute illness with associated systemic symptoms and significant discomfort requiring urgent management. In my opinion, this is a condition that a prudent lay person (someone who possesses an average knowledge of health and medicine) may potentially expect to result in complications if not addressed urgently such as respiratory distress, impairment of bodily function or dysfunction of bodily organs.   Routine symptom specific, illness specific and/or disease specific instructions were discussed with the patient and/or caregiver at length.   As such, the patient has been evaluated and assessed, work-up was  performed and treatment was provided in alignment with urgent care protocols and evidence based medicine.  Patient/parent/caregiver has been advised that the patient may require follow up for further testing and treatment if the symptoms continue in spite of treatment, as clinically indicated and appropriate.  If the patient was tested for COVID-19, Influenza and/or RSV, then the patient/parent/guardian was advised to isolate at home pending the results of his/her diagnostic coronavirus test and potentially longer if theyre positive. I have also advised pt that if his/her COVID-19 test returns positive, it's recommended to self-isolate for at least 10 days after symptoms first appeared AND until fever-free for 24 hours without fever reducer AND other symptoms have improved or resolved. Discussed self-isolation recommendations as well as instructions for household member/close contacts as per the [facility] and [place] DHHS, and also gave patient the [place] packet with this information.  Patient/parent/caregiver has been advised to return to the [facility] or PCP in 3-5 days if no better; to PCP or the Emergency Department if new signs and symptoms develop, or if the current signs or symptoms continue to change or worsen for further workup, evaluation and treatment as clinically indicated and appropriate  The patient will follow up with their current PCP if and as advised. If the patient does not currently have a PCP we will assist them in obtaining one.   The patient may need specialty follow up if the symptoms  continue, in spite of conservative treatment and management, for further workup, evaluation, consultation and treatment as clinically indicated and appropriate.   Patient/parent/caregiver verbalized understanding and agreement of plan as discussed.  All questions were addressed during visit.  Please see discharge instructions below for further details of plan.  Discharge Instructions:   Discharge Instructions       For your pain in your right knee, I provided you with a prescription for Voltaren gel.  If your insurance will not pay for this medication, it is available over-the-counter.  I absolutely recommend that you continue using Celebrex daily as recommended by your provider.  I have provided you with a note to be out of work for the next few days.  During this time, I invite you to rest your knee is much as possible, ice your knee is much as possible, keep it elevated above your hip is much as possible and avoid walking on it any more than you absolutely have to.  Thank you for visiting urgent care today.  Please let us know if there is anything else we can do to help you.    This office note has been dictated using Museum/gallery curator.  Unfortunately, and despite my best efforts, this method of dictation can sometimes lead to occasional typographical or grammatical errors.  I apologize in advance if this occurs.     Lynden Oxford Scales, PA-C 10/01/21 1623

## 2021-10-01 NOTE — Discharge Instructions (Addendum)
For your pain in your right knee, I provided you with a prescription for Voltaren gel.  If your insurance will not pay for this medication, it is available over-the-counter.  I absolutely recommend that you continue using Celebrex daily as recommended by your provider.  I have provided you with a note to be out of work for the next few days.  During this time, I invite you to rest your knee is much as possible, ice your knee is much as possible, keep it elevated above your hip is much as possible and avoid walking on it any more than you absolutely have to.  Thank you for visiting urgent care today.  Please let us know if there is anything else we can do to help you.

## 2021-10-01 NOTE — ED Triage Notes (Signed)
Pt reports at work yesterday was having to go up and down lots of stairs, causing pain in bilat knees and posterior right left. Reports hx bone spurs in left knee. Denies any falls or injuries.

## 2021-10-22 ENCOUNTER — Ambulatory Visit
Admission: RE | Admit: 2021-10-22 | Discharge: 2021-10-22 | Disposition: A | Discharge status: Home / Self Care | Payer: Commercial Managed Care - PPO | Source: Ambulatory Visit | Attending: Emergency Medicine | Admitting: Emergency Medicine

## 2021-10-22 ENCOUNTER — Other Ambulatory Visit: Payer: Self-pay

## 2021-10-22 VITALS — BP 131/79 | HR 84 | Temp 98.0°F | Resp 20

## 2021-10-22 DIAGNOSIS — J209 Acute bronchitis, unspecified: Secondary | ICD-10-CM

## 2021-10-22 DIAGNOSIS — B9789 Other viral agents as the cause of diseases classified elsewhere: Secondary | ICD-10-CM | POA: Diagnosis not present

## 2021-10-22 DIAGNOSIS — J988 Other specified respiratory disorders: Secondary | ICD-10-CM

## 2021-10-22 MED ORDER — IPRATROPIUM BROMIDE 0.06 % NA SOLN: 2.0000 | Freq: Four times a day (QID) | NASAL | 0 refills | Status: DC

## 2021-10-22 MED ORDER — PROMETHAZINE-DM 6.25-15 MG/5ML PO SYRP: 5.0000 mL | ORAL_SOLUTION | Freq: Four times a day (QID) | ORAL | 0 refills | Status: DC | PRN

## 2021-10-22 MED ORDER — AEROCHAMBER PLUS FLO-VU LARGE MISC: 1.0000 | Freq: Once | 0 refills | Status: AC

## 2021-10-22 MED ORDER — GUAIFENESIN 400 MG PO TABS: ORAL_TABLET | ORAL | 0 refills | Status: DC

## 2021-10-22 MED ORDER — METHYLPREDNISOLONE SODIUM SUCC 125 MG IJ SOLR
80.0000 mg | Freq: Once | INTRAMUSCULAR | Status: AC
  Administered 2021-10-22: 80 mg via INTRAMUSCULAR

## 2021-10-22 MED ORDER — ALBUTEROL SULFATE HFA 108 (90 BASE) MCG/ACT IN AERS: 2.0000 | INHALATION_SPRAY | Freq: Four times a day (QID) | RESPIRATORY_TRACT | 0 refills | Status: DC | PRN

## 2021-10-22 NOTE — Discharge Instructions (Addendum)
Your symptoms and physical exam findings are concerning for a viral respiratory infection.   ? ?Due to the duration of your symptoms, you would no longer benefit from antiviral therapy for influenza or COVID-19 therefore viral testing is also no longer indicated.   ? ?Conservative care is recommended at this time.  This includes rest, pushing clear fluids and activity as tolerated.  Warm beverages such as teas and broths versus cold beverages/popsicles and frozen sherbet/sorbet are your choice, both warm and cold are beneficial.  You may also notice that your appetite is reduced; this is okay as long as you are drinking plenty of clear fluids.  ?  ?Please see the list below for recommended medications, dosages and frequencies to provide relief of your current symptoms:   ?  ?Methylprednisolone IM (Solu-Medrol):  To quickly address your significant respiratory inflammation, you were provided with an injection of methylprednisolone in the office today.  You should continue to feel the full benefit of the steroid for the next 4 to 6 hours.  ?  ?Albuterol HFA: This is a bronchodilator, it relaxes the smooth muscles that constrict your airway in your lungs when you are feeling sick or having inflammation secondary to allergies or upper respiratory infection.  Please inhale 2 puffs twice daily every day using the spacer provided.  You can also inhale 2 more puffs as often as needed throughout the day for aggravating cough, chest tightness, feeling short of breath, wheezing.    ?  ?Ipratropium (Atrovent): This is an excellent nasal decongestant spray that does not cause rebound congestion, can be used up to 4 times daily as needed, instill 2 sprays into each nare with each use.  I have provided you with a prescription for this medication.    ?  ?Guaifenesin (Robitussin, Mucinex): This is an expectorant.  This helps break up chest congestion and loosen up thick nasal drainage making phlegm and drainage more liquid and  therefore easier to remove.  I recommend being 400 mg three times daily as needed.    ?  ?Promethazine DM: Promethazine is both a nasal decongestant and an antinausea medication that makes most patients feel fairly sleepy.  The DM is dextromethorphan, a cough suppressant found in many over-the-counter cough medications.  Please take 5 mL before bedtime to minimize your cough which will help you sleep better.  I have provided you with a prescription for this medication.    ?  ?Conservative care is also recommended at this time.  This includes rest, pushing clear fluids and activity as tolerated.  Warm beverages such as teas and broths versus cold beverages/popsicles and frozen sherbet/sorbet are your choice, both warm and cold are beneficial.  You may also notice that your appetite is reduced; this is okay as long as you are drinking plenty of clear fluids.  ?  ?Please follow-up within the next 3 to 5 days either with your primary care provider or urgent care if your symptoms do not resolve.  If you do not have a primary care provider, we will assist you in finding one. ?  ?Thank you for visiting urgent care today.  We appreciate the opportunity to participate in your care. ? ?

## 2021-10-22 NOTE — ED Provider Notes (Signed)
?[facility] ? ? ? ?CSN: [id number] ?Arrival date & time: 10/22/21  1333 ?  ? ?HISTORY  ? ?Chief Complaint  ?Patient presents with  ? Appointment  ? Sore Throat  ? Cough  ? Nasal Congestion  ? ?HPI ?Jacob House is a 56 y.o. male. Patient complains of a week of sore throat, cough, congestion and sinus pressure.  Patient states he has been taking Flonase, Sudafed and Advil without meaningful relief.  Patient has normal vital signs on arrival today.  Patient is in no acute distress. ? ?The history is provided by the patient.  ?Past Medical History:  ?Diagnosis Date  ? Allergy   ? Carpal tunnel syndrome   ? Complication of anesthesia 04-23-2013  ? pt told by anesthesia after probably has sleep apnea  ? Complication of anesthesia   ? cannot lie on left side due to recent shoulder surgery per pt  ? Family history of colon cancer 06/05/2021  ? Family history of Lynch syndrome 06/05/2021  ? GERD (gastroesophageal reflux disease)   ? OSA on CPAP 12/08/2013  ? Snoring   ? ?Patient Active Problem List  ? Diagnosis Date Noted  ? Genetic testing 06/26/2021  ? 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 Family history of colon cancer 06/05/2021  ? Family history of Lynch syndrome 06/05/2021  ? Acute paronychia of toe of right foot 02/24/2018  ? OSA on CPAP 12/08/2013  ? OSA on CPAP 12/08/2013  ? Colon polyps 08/06/2012  ? Carpal tunnel syndrome 06/13/2012  ? ?Past Surgical History:  ?Procedure Laterality Date  ? ANAL FISSURE REPAIR  july 2009 and yrs ago  ? x 2   ? CHOLECYSTECTOMY  1999  ? COLONOSCOPY WITH PROPOFOL N/A 07/22/2013  ? Procedure: COLONOSCOPY WITH PROPOFOL;  Surgeon: Cleotis Nipper, MD;  Location: [facility];  Service: Endoscopy;  Laterality: N/A;  ? ESOPHAGOGASTRODUODENOSCOPY (EGD) WITH PROPOFOL N/A 07/22/2013  ? Procedure: ESOPHAGOGASTRODUODENOSCOPY (EGD) WITH PROPOFOL;  Surgeon: Cleotis Nipper, MD;  Location: [facility];  Service: Endoscopy;  Laterality: N/A;  ? EYE SURGERY Bilateral 2005  ? lasix  ? FRACTURE  SURGERY Right   ? foot  ? GALLBLADDER SURGERY    ? SHOULDER SURGERY Left sept 2014  ? cartlidge repair  ? ? ?Home Medications   ? ?Prior to Admission medications   ?Medication Sig Start Date End Date Taking? Authorizing Provider  ?celecoxib (CELEBREX) 400 MG capsule Take 400 mg by mouth daily as needed. 08/23/21   [provider]  ?diclofenac Sodium (VOLTAREN) 1 % GEL Apply 4 g topically 4 (four) times daily. 10/01/21   Lynden Oxford Scales, PA-C  ? ?Family History ?Family History  ?Problem Relation Age of Onset  ? Colon cancer Mother 22  ? Testicular cancer Father   ?     d. 72  ? Colon cancer Maternal Uncle   ?     d. 51  ? Colon cancer Maternal Uncle   ?     dx 59s; Lynch syndrome  ? Heart disease Maternal Uncle   ? Colon cancer Maternal Grandmother   ?     dx before 40  ? Breast cancer Maternal Grandmother   ?     dx 21s  ? Colon cancer Maternal Grandfather   ?     dx before 78  ? ?Social History ?Social History  ? ?Tobacco Use  ? Smoking status: Never  ? Smokeless tobacco: Never  ?Substance Use Topics  ? Alcohol use: Yes  ?  Alcohol/week: 0.0 - 1.0 standard drinks  ?  Comment: rare  ? Drug use: No  ? ?Allergies   ?Patient has no known allergies. ? ?Review of Systems ?Review of Systems ?Pertinent findings noted in history of present illness.  ? ?Physical Exam ?Triage Vital Signs ?ED Triage Vitals  ?Enc Vitals Group  ?   BP 06/01/21 0827 (!) 147/82  ?   Pulse Rate 06/01/21 0827 72  ?   Resp 06/01/21 0827 18  ?   Temp 06/01/21 0827 98.3 ?F (36.8 ?C)  ?   Temp Source 06/01/21 0827 Oral  ?   SpO2 06/01/21 0827 98 %  ?   Weight --   ?   Height --   ?   Head Circumference --   ?   Peak Flow --   ?   Pain Score 06/01/21 0826 5  ?   Pain Loc --   ?   Pain Edu? --   ?   Excl. in [place]? --   ?No data found. ? ?Updated Vital Signs ?BP 131/79 (BP Location: Right Arm)   Pulse 84   Temp 98 ?F (36.7 ?C) (Oral)   Resp 20   SpO2 95%  ? ?Physical Exam ?Constitutional:   ?   General: He is not in acute distress. ?    Appearance: He is well-developed. He is obese. He is not ill-appearing.  ?HENT:  ?   Head: Normocephalic and atraumatic.  ?   Salivary Glands: Right salivary gland is not diffusely enlarged or tender. Left salivary gland is not diffusely enlarged or tender.  ?   Right Ear: Ear canal and external ear normal. No drainage, swelling or tenderness. Tympanic membrane is erythematous.  ?   Left Ear: Ear canal and external ear normal. No drainage, swelling or tenderness.  No middle ear effusion. Tympanic membrane is erythematous.  ?   Nose: Congestion and rhinorrhea present. Rhinorrhea is clear.  ?   Right Sinus: No maxillary sinus tenderness or frontal sinus tenderness.  ?   Left Sinus: No maxillary sinus tenderness.  ?   Mouth/Throat:  ?   Mouth: Mucous membranes are moist. No oral lesions.  ?   Pharynx: Pharyngeal swelling, posterior oropharyngeal erythema and uvula swelling present. No oropharyngeal exudate.  ?   Tonsils: No tonsillar exudate. 0 on the right. 0 on the left.  ?Neck:  ?   Thyroid: No thyromegaly.  ?Cardiovascular:  ?   Rate and Rhythm: Normal rate and regular rhythm.  ?   Pulses: Normal pulses.  ?Pulmonary:  ?   Effort: Pulmonary effort is normal. No accessory muscle usage, prolonged expiration or respiratory distress.  ?   Breath sounds: No stridor. No wheezing, rhonchi or rales.  ?   Comments: Turbulent breath sounds throughout without wheeze, rale, rhonchi.  Significant bronchospasm with cough. ?Abdominal:  ?   General: Abdomen is flat. Bowel sounds are normal.  ?   Palpations: Abdomen is soft.  ?Musculoskeletal:     ?   General: Normal range of motion.  ?   Cervical back: Normal range of motion and neck supple.  ?Lymphadenopathy:  ?   Cervical: No cervical adenopathy.  ?Skin: ?   General: Skin is warm and dry.  ?Neurological:  ?   General: No focal deficit present.  ?   Mental Status: He is alert and oriented to person, place, and time.  ?   Motor: Motor function is intact.  ?   Coordination:  Coordination  is intact.  ?   Gait: Gait is intact.  ?   Deep Tendon Reflexes: Reflexes are normal and symmetric.  ?Psychiatric:     ?   Attention and Perception: Attention and perception normal.     ?   Mood and Affect: Mood and affect normal.     ?   Speech: Speech normal.     ?   Behavior: Behavior normal. Behavior is cooperative.     ?   Thought Content: Thought content normal.  ? ? ?Visual Acuity ?Right Eye Distance:   ?Left Eye Distance:   ?Bilateral Distance:   ? ?Right Eye Near:   ?Left Eye Near:    ?Bilateral Near:    ? ?UC Couse / Diagnostics / Procedures:  ?  ?EKG ? ?Radiology ?No results found. ? ?Procedures ?Procedures (including critical care time) ? ?UC Diagnoses / Final Clinical Impressions(s)   ?I have reviewed the triage vital signs and the nursing notes. ? ?Pertinent labs & imaging results that were available during my care of the patient were reviewed by me and considered in my medical decision making (see chart for details).   ?Final diagnoses:  ?Viral respiratory infection  ?Bronchospasm with bronchitis, acute  ? ?Patient provided with albuterol inhaler for significant bronchospasm.  Patient also advised to take Mucinex daytime and Promethazine DM at night.  Patient provided with Atrovent nasal spray to dry up mucous membranes.  Patient currently on Celebrex, no indication for ibuprofen in favor of Celebrex.  Patient provided with a methylprednisolone injection for better relief of lower respiratory inflammation.  Viral testing not indicated due to duration of symptoms, physical exam findings did not reveal any concern for bacterial respiratory infection.  Return precautions advised. ? ?ED Prescriptions   ? ? Medication Sig Dispense Auth. Provider  ? ipratropium (ATROVENT) 0.06 % nasal spray Place 2 sprays into both nostrils 4 (four) times daily. As needed for nasal congestion, runny nose 15 mL Lynden Oxford Scales, PA-C  ? promethazine-dextromethorphan (PROMETHAZINE-DM) 6.25-15 MG/5ML syrup  Take 5 mLs by mouth 4 (four) times daily as needed for cough. 180 mL Lynden Oxford Scales, PA-C  ? guaifenesin (HUMIBID E) 400 MG TABS tablet Take 1 tablet 3 times daily as needed for chest congestion and cough 21 tabl

## 2021-10-22 NOTE — ED Triage Notes (Signed)
Pt c/o being sick for about a week (sore throat, cough, congestion, and sinus pressure).  ?Home interventions: Sudafed, Flonase, Advil. ?

## 2021-10-26 ENCOUNTER — Telehealth: Payer: Self-pay | Admitting: Emergency Medicine

## 2021-10-26 MED ORDER — METHYLPREDNISOLONE 4 MG PO TBPK: ORAL_TABLET | ORAL | 0 refills | Status: DC

## 2021-10-26 NOTE — Telephone Encounter (Signed)
Pt identified with name and birthday. Patient called for new new prescription due to continued symptoms. Provider aware, patient made aware of new medication sent to pharmacy (all questions answered).  ?

## 2021-10-26 NOTE — Telephone Encounter (Signed)
Patient provided with steroid injection during most recent visit, patient calling back requesting oral steroid as well.  Prescription sent. ?

## 2021-12-17 ENCOUNTER — Other Ambulatory Visit: Payer: Self-pay | Admitting: Orthopedic Surgery

## 2021-12-25 ENCOUNTER — Encounter: Payer: Self-pay | Admitting: Neurology

## 2021-12-25 ENCOUNTER — Ambulatory Visit: Payer: Commercial Managed Care - PPO | Admitting: Neurology

## 2021-12-25 ENCOUNTER — Encounter: Payer: Self-pay | Admitting: *Deleted

## 2021-12-25 VITALS — BP 118/74 | HR 70 | Ht 67.0 in | Wt 251.6 lb

## 2021-12-25 DIAGNOSIS — G4733 Obstructive sleep apnea (adult) (pediatric): Secondary | ICD-10-CM

## 2021-12-25 DIAGNOSIS — Z9989 Dependence on other enabling machines and devices: Secondary | ICD-10-CM

## 2021-12-25 NOTE — Patient Instructions (Addendum)
It was nice to see you again today.  I wish you good luck with your upcoming knee surgery.  Please continue using your CPAP regularly. While your insurance requires that you use CPAP at least 4 hours each night on 70% of the nights, I recommend, that you not skip any nights and use it throughout the night if you can. Getting used to CPAP and staying with the treatment long term does take time and patience and discipline. Untreated obstructive sleep apnea when it is moderate to severe can have an adverse impact on cardiovascular health and raise her risk for heart disease, arrhythmias, hypertension, congestive heart failure, stroke and diabetes. Untreated obstructive sleep apnea causes sleep disruption, nonrestorative sleep, and sleep deprivation. This can have an impact on your day to day functioning and cause daytime sleepiness and impairment of cognitive function, memory loss, mood disturbance, and problems focussing. Using CPAP regularly can improve these symptoms. Please follow-up routinely in 1 year.

## 2021-12-25 NOTE — Progress Notes (Signed)
Subjective:    Patient ID: Jacob House is a 56 y.o. male.  HPI    Interim history:   Mr. Veney is a 56-year-old right-handed gentleman with an underlying medical history of carpal tunnel syndrome, shoulder pain, right knee pain, and obesity, who presents for followup consultation of his obstructive sleep apnea, treated with CPAP. He is unaccompanied today and presents for his yearly check up. I last saw him on 12/25/20, at which time he was fully compliant with his CPAP and doing well.  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  He started a new job in January 2022.  He was supposed to start working in [place] in October 2022. He had gained about 20 pounds.   Today, 12/25/2021: I reviewed his CPAP compliance data from 11/24/2021 through 12/23/2021, which is a total of 30 days, during which time he used his machine every night with percent use days greater than 4 hours at 100%, indicating superb compliance with an average usage of 8 hours and 11 minutes, residual AHI at goal at 1.7/h, leak on the higher side with the 95th percentile at 32 mL/min on a pressure of 15 cm with EPR of 2.  He reports doing okay but bothered by his right knee pain.  He is scheduled for arthroscopic surgery next month.  He had injection into his left knee.  He is currently unable to fly and is not working until his surgery.  He is compliant with his CPAP and continues to do well on it.  He is typically up-to-date with his supplies.  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  He no longer works in [place], he goes to [place] for his job and no longer works for [employer].  The patient's allergies, current medications, family history, past medical history, past social history, past surgical history and problem list were reviewed and updated as appropriate.    Previously (copied from previous notes for reference):     I saw him on 12/29/19, at which time he was fully  compliant with his CPAP. He started working with [employer] in September 2020.  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 He was then fully vaccinated for Covid.   I reviewed his CPAP compliance data from 11/24/2020 through 12/23/2020, which is a total of 30 days, during which time he used his machine every night with percent use days greater than 4 hours at 97%, indicating excellent compliance with an average usage of 7 hours and 9 minutes, residual AHI at goal at 1.9/h, leak acceptable with some increase noted at times, 95th percentile of leak at 26.8 L/min on a pressure of 15 cm with EPR of 2.  I also reviewed his compliance data for the past calendar year, he was fully compliant with treatment.       I saw him on 12/21/2018 in a virtual visit, at which time he had started using a new CPAP machine and he was compliant with treatment.  He was advised to follow-up routinely in 1 year.   I reviewed his CPAP compliance data from the past year, during which time he was fully compliant.  I also reviewed his most recent compliance data from 11/29/2019, through 12/28/2019, which is a total of 30 days, during which time he used his machine every night with percent use days greater than 4 hours   at 100%, indicating superb compliance with an average usage of 6 hours and 56 minutes, residual AHI at goal at 2.8/h, leak on the high side consistently with a 95th percentile at 38.6 L/min on a pressure of 15 cm with EPR of 2.       I saw him on 07/16/2018, at which time he was compliant with his CPAP and presented for a visit to qualify for his new CPAP machine.  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 He did notice some odd noises coming from his CPAP machine and it was over 67 years old.   I reviewed his CPAP compliance data from 11/17/2018 through 12/16/2018 which is a total of 30 days, during which time he  used his machine every night with percent used days greater than 4 hours at 100%, indicating superb compliance with an average usage of 7 hours and 1 minute, residual AHI at goal at 1.9/h, leak on the high side consistently with a 95th percentile at 43.6 L/min on a pressure of 15 cm with EPR of 2. I also reviewed his compliance data from 08/09/2017 through 08/08/2018 which is a total of 1 year, during which time his percent used days greater than 4 hours is 98.1% altogether with an average AHI at 2.5/h, pressure at 15 cm so altogether he has been compliant without any lapses of treatment for the past year which is exemplary. Set up date on the new machine was 08/07/2018.    I saw him on 12/17/2017, at which time he was fully compliant with his previous CPAP machine. He was advised to follow-up routinely in one year.    He called in the interim reporting issues and abnormal sounds with his CPAP machine and he was eligible for a new CPAP machine.    I reviewed his CPAP compliance data from 06/16/2018 through 07/15/2018 which is a total of 30 days, during which time he used his CPAP machine every night with percent used days greater than 4 hours at 100%, indicating superb compliance with an average usage of 6 hours and 55 minutes, residual AHI at goal at 2.5 per hour, pressure at 15, leak on the lower end.    I saw him on 01/21/2017, at which time he was fully compliant with his CPAP. I gave him a prescription for travel CPAP machine. He reported doing well, he was working on his Merchandiser, retail and working on weight loss. I suggested a one-year checkup.   He requested a replacement prescription for his travel CPAP in the interim, which we provided, but he could not fill it until he has a face-to-face visit as I understand.   I reviewed his CPAP compliance data from the past 30 days from 11/17/2017 through 12/16/2017, during which time he used his CPAP every night with percent used days greater than 4  hours at 100%, indicating superb compliance with an average usage of 6 hours and 50 minutes, residual AHI at goal at 2.6 per hour, leak fluctuates, some days better than others, average leakage is acceptable. We also reviewed his compliance data for the last year, he missed 1 day and has been fully compliant, apnea score at goal and leak on the low side.    I saw him on 11/28/2015, at which time he was doing well, he was fully compliant with his CPAP. I received his compliance data after the visit via fax from his DME company and he  was under percent compliant at the time, he was trying to lose weight. 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   I reviewed his CPAP compliance data from 12/22/2016 through 01/20/2017 which is a total of 30 days, during which time he used his machine every night with percent used days greater than 4 hours at 100%, average usage of 6 hours and 48 minutes, residual AHI 2.4 per hour, leak low generally speaking, pressure of 15 cm.    I saw him on 11/17/2014 at which time he reported full compliance with CPAP. I reviewed his pulse ox report which indicated that he was appropriately treated with CPAP only and did not need supplemental oxygen thankfully. He changed from a nasal mask to nasal pillows interface in February 2016 and like to it. He had some issues with low back pain.    I reviewed his CPAP compliance data from 10/29/15 to 11/27/15, which is a total of 30 days, during which time He used his machine 29 days with percent used days greater than 4 hours at 96.7%, indicating excellent compliance with an average usage of 6 hours and 16 minutes, residual AHI 3.4 per hour, pressure at 15 cm, leak low.    Addendum 11/17/2014: Later in the afternoon I received his CPAP compliance data from his DME company via fax. This was from 08/24/2014 through 09/22/2014 which is a total of 30 days during which time he used his machine every  night with percent used days greater than 4 hours at 100%, indicating superb compliance with an average usage of 6 hours and 35 minutes. Residual AHI acceptable at 3.9 per hour on a pressure of 15 cm and leak low. This indicates superb compliance and of course supports his verbal report of full compliance.    I saw him on 05/09/2014, at which time he reported compliance with treatment. He had some residual daytime somnolence and some issues with mask fitting. He was using a East on nasal mask. He was still not always fully rested in the mornings. He was given a prescription for Voltaren by his orthopedic doctor for his arthritis. He was trying to lose weight and had in fact lost some weight. I asked him to meet with his DME provider for mask refit. I also suggested we proceed with an overnight pulse oximetry test to ensure that his oxygen saturations were fine on treatment.   I reviewed the patient's overnight pulse oximetry report from 09/23/2014 while on CPAP therapy at night. His average oxygen saturation was 95.2%, total test time was 6 hours and 11 minutes. Lowest oxygen saturation was 86%. Time below 88% saturation was 28 seconds. Based on these test results, it appears that the patient is appropriately treated on room air with CPAP only and does not require additional supplemental oxygen at this time. Average pulse rate was 60.2 bpm, maximum was 99 bpm, minimum was 46 bpm during the testing time.    I reviewed his compliance data from 04/10/14 to 05/08/14, which is 30 days, during which time he was 100%, percent used days greater than 4 hours was 90%, indicating excellent compliance. Residual AHI was 3.1 per hour. Leak was at times quite high.    I saw him on 12/08/2013, at which time I advised him regarding his sleep study results and his compliance. He indicated using his CPAP regularly. There was no data on his SD card. His [employer] CPAP machine   indicated that he had been using it every  day for the previous 30 days. He reported sleeping well with CPAP and having adjusted fairly well to it. His sleepiness was improved. I asked him to continue using CPAP regularly.   I reviewed the patient's CPAP compliance data from 11/09/2013 to 12/08/2013, which is a total of 30 days, during which time the patient used CPAP every day. The average usage for all days was 6 hours and 39 minutes. The percent used days greater than 4 hours was 100 %, indicating superb compliance. The residual AHI was 5.6 per hour, indicating an adequate treatment pressure of 15 cwp. Air leak from the mask was low.   I first met him on 08/31/2013, at which time he reported snoring and excessive daytime somnolence as well as witnessed apneas. I requested he return for sleep study. He had a split-night sleep study on 10/06/2013: Baseline sleep efficiency was significantly reduced at 45.5% with a long sleep latency of 116.5 minutes and wake after sleep onset of 34 minutes with mild sleep fragmentation noted. He had an elevated arousal index. He had markedly increased percentage of stage II sleep, near absence of slow-wave sleep, and absence of REM sleep prior to CPAP. He had no clinically significant periodic leg movements or EKG changes. He had moderate to loud snoring. His total AHI was 57.8 per hour. Baseline oxygen saturation was 91%, nadir was 67%. He was then titrated on CPAP with sleep efficiency improved at 73.3% with a latency to sleep of 50.5 minutes and wake after sleep onset of 3.5 minutes with mild sleep fragmentation noted. His arousal index was normal. He had a increased percentage of stage II sleep, near absence of slow-wave sleep and an increased percentage of REM sleep at 34.4%. Average oxygen saturation was improved at 93%, nadir was 70% which was during the initial titration period. On the final pressure of 14 cm his O2 nadir was 91% but he did not achieve REM sleep on the final pressure. He was titrated from 4-14  cm with reduction of the AHI of 6.2 events per hour at the final pressure. Based on the test results I did start the patient on CPAP at a pressure of 15 cm as he had a residual AHI of over 5/h on the final pressure of 14 cm during the study.     I reviewed his compliance data from 10/26/2013 through 11/07/2013 which is a total of 13 days during which time he was CPAP every night. Percent used days greater than 4 hours was 100%, average usage was 7 hours and 17 minutes. Residual AHI was 8 per hour on a pressure of 15 cm with no significant leak.   His Past Medical History Is Significant For: Past Medical History:  Diagnosis Date   Allergy    Carpal tunnel syndrome    Complication of anesthesia 04/23/2013   pt told by anesthesia after probably has sleep apnea   Complication of anesthesia    cannot lie on left side due to recent shoulder surgery per pt   Family history of colon cancer 06/05/2021   Family history of Lynch syndrome 06/05/2021   GERD (gastroesophageal reflux disease)    Lynch syndrome    OSA on CPAP 12/08/2013   Snoring     His Past Surgical History Is Significant For: Past Surgical History:  Procedure Laterality Date   ANAL FISSURE REPAIR  july 2009 and yrs ago   x 2    CHOLECYSTECTOMY    1999   COLONOSCOPY WITH PROPOFOL N/A 07/22/2013   Procedure: COLONOSCOPY WITH PROPOFOL;  Surgeon: Robert V Buccini, MD;  Location: [facility];  Service: Endoscopy;  Laterality: N/A;   ESOPHAGOGASTRODUODENOSCOPY (EGD) WITH PROPOFOL N/A 07/22/2013   Procedure: ESOPHAGOGASTRODUODENOSCOPY (EGD) WITH PROPOFOL;  Surgeon: Robert V Buccini, MD;  Location: [facility];  Service: Endoscopy;  Laterality: N/A;   EYE SURGERY Bilateral 2005   lasix   FRACTURE SURGERY Right    foot   GALLBLADDER SURGERY     SHOULDER SURGERY Left sept 2014   cartlidge repair    His Family History Is Significant For: Family History  Problem Relation Age of Onset   Colon cancer Mother 46   Testicular cancer  Father        d. 34   Colon cancer Maternal Uncle        d. 49   Colon cancer Maternal Uncle        dx 50s; Lynch syndrome   Heart disease Maternal Uncle    Colon cancer Maternal Grandmother        dx before 50   Breast cancer Maternal Grandmother        dx 50s   Colon cancer Maternal Grandfather        dx before 50    His Social History Is Significant For: Social History   Socioeconomic History   Marital status: Significant Other    Spouse name: Kathy   Number of children: 0   Years of education: 16   Highest education level: Not on file  Occupational History   Occupation: machine operator    Employer: [employer]  Tobacco Use   Smoking status: Never   Smokeless tobacco: Never  Substance and Sexual Activity   Alcohol use: Yes    Alcohol/week: 0.0 - 1.0 standard drinks    Comment: rare   Drug use: No   Sexual activity: Yes    Partners: Female    Birth control/protection: Condom    Comment: 1 sexual partner in last year  Other Topics Concern   Not on file  Social History Narrative   Lives with his significant other, Kathy, and their cats.   Social Determinants of Health   Financial Resource Strain: Not on file  Food Insecurity: Not on file  Transportation Needs: Not on file  Physical Activity: Not on file  Stress: Not on file  Social Connections: Not on file    His Allergies Are:  No Known Allergies:   His Current Medications Are:  Outpatient Encounter Medications as of 12/25/2021  Medication Sig   celecoxib (CELEBREX) 400 MG capsule Take 400 mg by mouth daily as needed.   [DISCONTINUED] albuterol (VENTOLIN HFA) 108 (90 Base) MCG/ACT inhaler Inhale 2 puffs into the lungs every 6 (six) hours as needed for wheezing or shortness of breath (Cough).   [DISCONTINUED] diclofenac Sodium (VOLTAREN) 1 % GEL Apply 4 g topically 4 (four) times daily.   [DISCONTINUED] guaifenesin (HUMIBID E) 400 MG TABS tablet Take 1 tablet 3 times daily as needed for chest  congestion and cough   [DISCONTINUED] ipratropium (ATROVENT) 0.06 % nasal spray Place 2 sprays into both nostrils 4 (four) times daily. As needed for nasal congestion, runny nose   [DISCONTINUED] methylPREDNISolone (MEDROL DOSEPAK) 4 MG TBPK tablet Take 24 mg on day 1, 20 mg on day 2, 16 mg on day 3, 12 mg on day 4, 8 mg on day 5, 4 mg on day 6.   [DISCONTINUED] promethazine-dextromethorphan (PROMETHAZINE-DM)   6.25-15 MG/5ML syrup Take 5 mLs by mouth 4 (four) times daily as needed for cough.   No facility-administered encounter medications on file as of 12/25/2021.  :  Review of Systems:  Out of a complete 14 point review of systems, all are reviewed and negative with the exception of these symptoms as listed below:   Review of Systems  Neurological:        Annual cpap follow up. No concerns.  Needs copy of annual report and then letter.   Out of work due to R knee torn meniscus- due surgery next month.    Objective:  Neurological Exam  Physical Exam Physical Examination:   Vitals:   12/25/21 0939  BP: 118/74  Pulse: 70    General Examination: The patient is a very pleasant 56 y.o. male in no acute distress. He appears well-developed and well-nourished and well groomed.   HEENT: Extraocular tracking is well-preserved.  Hearing is grossly intact. Face is symmetric with normal facial animation and normal facial sensation. Speech is clear with no dysarthria noted. There is no hypophonia. There is no lip, neck/head, jaw or voice tremor. Neck shows FROM. Oropharynx exam reveals: no obvious change, mild mouth dryness, tongue protrudes centrally and palate elevates symmetrically.    Chest: Clear to auscultation without wheezing, rhonchi or crackles noted.   Heart: S1+S2+0, regular and normal without murmurs, rubs or gallops noted.    Abdomen: Soft, non-tender and non-distended.   Extremities: There is no obvious edema.      Skin: Warm and dry without trophic changes noted.     Musculoskeletal: exam reveals limited mobility in the right knee and he walks with a significant limp, no walking aid.    Neurologically:  Mental status: The patient is awake, alert and oriented in all 4 spheres. His memory, attention, language and knowledge are appropriate. There is no aphasia, agnosia, apraxia or anomia. Speech is clear with normal prosody and enunciation. Thought process is linear. Mood is congruent and affect is normal.  Cranial nerves are as described above under HEENT exam.  Motor exam: Normal bulk, strength and tone is noted. There is no tremor. Fine motor skills are intact grossly.  Cerebellar testing shows no dysmetria or intention tremor. There is no truncal or gait ataxia.  Sensory exam is intact to light touch in the upper and lower extremities.  Gait, station and balance are unremarkable.    Assessment and Plan:    In summary, Dorion Petillo is a very pleasant 56 year old male with an underlying medical history of carpal tunnel syndrome, arthritis and obesity, who presents for followup consultation of his obstructive sleep apnea, well treated with CPAP of 15 cm with ongoing good benefit reported and excellent compliance.  He has recovered from having COVID in January 2021, he was vaccinated, but not boostered. He had Covid again in 09/22. He has started a new job in January 2022.  He is commended for his treatment adherence.  He is scheduled for right knee arthroscopic surgery next month.  He is motivated to continue with his CPAP.  He is encouraged to continue to work on weight loss which hopefully will be easier for him after his knee surgery and after his rehab. 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  He has been compliant for the past calendar year.  He was given a copy of his download for the past year. Of note, he had a split-night sleep study in  March 2015. He was able to get a new CPAP machine in 2020. He is training to become  a Programme researcher, broadcasting/film/video; he is a International aid/development worker. He is advised to follow-up routinely in 1 year, sooner if needed.  I answered all his questions today and he was in agreement.

## 2022-01-07 NOTE — Pre-Procedure Instructions (Signed)
Surgical Instructions    Your procedure is scheduled on Tuesday 01/15/22.   Report to [facility] Main Entrance "A" at 10:20 A.M., then check in with the Admitting office.  Call this number if you have problems the morning of surgery:  [phone]   If you have any questions prior to your surgery date call [phone]: Open Monday-Friday 8am-4pm    Remember:  Do not eat after midnight the night before your surgery  You may drink clear liquids until 09:20 A.M. the morning of your surgery.   Clear liquids allowed are: Water, Non-Citrus Juices (without pulp), Carbonated Beverages, Clear Tea, Black Coffee ONLY (NO MILK, CREAM OR POWDERED CREAMER of any kind), and Gatorade  Patient Instructions  The night before surgery:  No food after midnight. ONLY clear liquids after midnight  The day of surgery (if you do NOT have diabetes):  Drink ONE (1) Pre-Surgery Clear Ensure by 09:20 A.M. the morning of surgery. Drink in one sitting. Do not sip.  This drink was given to you during your hospital  pre-op appointment visit.  Nothing else to drink after completing the  Pre-Surgery Clear Ensure.         If you have questions, please contact your surgeon's office.     Take these medicines the morning of surgery with A SIP OF WATER:   celecoxib (CELEBREX)    As of today, STOP taking any Aspirin (unless otherwise instructed by your surgeon) Aleve, Naproxen, Ibuprofen, Motrin, Advil, Goody's, BC's, all herbal medications, fish oil, and all vitamins.           Do not wear jewelry or makeup Do not wear lotions, powders, perfumes/colognes, or deodorant. Do not shave 48 hours prior to surgery.  Men may shave face and neck. Do not bring valuables to the hospital. Do not wear nail polish, gel polish, artificial nails, or any other type of covering on natural nails (fingers and toes) If you have artificial nails or gel coating that need to be removed by a nail salon, please have this removed prior  to surgery. Artificial nails or gel coating may interfere with anesthesia's ability to adequately monitor your vital signs.  [facility] is not responsible for any belongings or valuables. .   Do NOT Smoke (Tobacco/Vaping)  24 hours prior to your procedure  If you use a CPAP at night, you may bring your mask for your overnight stay.   Contacts, glasses, hearing aids, dentures or partials may not be worn into surgery, please bring cases for these belongings   For patients admitted to the hospital, discharge time will be determined by your treatment team.   Patients discharged the day of surgery will not be allowed to drive home, and someone needs to stay with them for 24 hours.   SURGICAL WAITING ROOM VISITATION Patients having surgery or a procedure in a hospital may have two support people. Children under the age of 60 must have an adult with them who is not the patient. They may stay in the waiting area during the procedure and may switch out with other visitors. If the patient needs to stay at the hospital during part of their recovery, the visitor guidelines for inpatient rooms apply.  Please refer to the [facility] website for the visitor guidelines for Inpatients (after your surgery is over and you are in a regular room).       Special instructions:    Oral Hygiene is also important to reduce your risk of infection.  Remember - BRUSH YOUR TEETH THE MORNING OF SURGERY WITH YOUR REGULAR TOOTHPASTE   [facility]- Preparing For Surgery  Before surgery, you can play an important role. Because skin is not sterile, your skin needs to be as free of germs as possible. You can reduce the number of germs on your skin by washing with CHG (chlorahexidine gluconate) Soap before surgery.  CHG is an antiseptic cleaner which kills germs and bonds with the skin to continue killing germs even after washing.     Please do not use if you have an allergy to CHG or antibacterial soaps. If your  skin becomes reddened/irritated stop using the CHG.  Do not shave (including legs and underarms) for at least 48 hours prior to first CHG shower. It is OK to shave your face.  Please follow these instructions carefully.     Shower the NIGHT BEFORE SURGERY and the MORNING OF SURGERY with CHG Soap.   If you chose to wash your hair, wash your hair first as usual with your normal shampoo. After you shampoo, rinse your hair and body thoroughly to remove the shampoo.  Then [employer] and genitals (private parts) with your normal soap and rinse thoroughly to remove soap.  After that Use CHG Soap as you would any other liquid soap. You can apply CHG directly to the skin and wash gently with a scrungie or a clean washcloth.   Apply the CHG Soap to your body ONLY FROM THE NECK DOWN.  Do not use on open wounds or open sores. Avoid contact with your eyes, ears, mouth and genitals (private parts). Wash Face and genitals (private parts)  with your normal soap.   Wash thoroughly, paying special attention to the area where your surgery will be performed.  Thoroughly rinse your body with warm water from the neck down.  DO NOT shower/wash with your normal soap after using and rinsing off the CHG Soap.  Pat yourself dry with a CLEAN TOWEL.  Wear CLEAN PAJAMAS to bed the night before surgery  Place CLEAN SHEETS on your bed the night before your surgery  DO NOT SLEEP WITH PETS.   Day of Surgery:  Take a shower with CHG soap. Wear Clean/Comfortable clothing the morning of surgery Do not apply any deodorants/lotions.   Remember to brush your teeth WITH YOUR REGULAR TOOTHPASTE.    If you received a COVID test during your pre-op visit, it is requested that you wear a mask when out in public, stay away from anyone that may not be feeling well, and notify your surgeon if you develop symptoms. If you have been in contact with anyone that has tested positive in the last 10 days, please notify your  surgeon.    Please read over the following fact sheets that you were given.

## 2022-01-08 ENCOUNTER — Encounter (HOSPITAL_COMMUNITY): Payer: Self-pay

## 2022-01-08 ENCOUNTER — Encounter (HOSPITAL_COMMUNITY)
Admission: RE | Admit: 2022-01-08 | Discharge: 2022-01-08 | Disposition: A | Discharge status: Home / Self Care | Payer: Commercial Managed Care - PPO | Source: Ambulatory Visit | Attending: Orthopedic Surgery | Admitting: Orthopedic Surgery

## 2022-01-08 ENCOUNTER — Other Ambulatory Visit: Payer: Self-pay

## 2022-01-08 VITALS — BP 141/76 | HR 80 | Temp 97.7°F | Resp 17 | Ht 67.0 in | Wt 254.1 lb

## 2022-01-08 DIAGNOSIS — Z01812 Encounter for preprocedural laboratory examination: Secondary | ICD-10-CM | POA: Insufficient documentation

## 2022-01-08 DIAGNOSIS — Z01818 Encounter for other preprocedural examination: Secondary | ICD-10-CM

## 2022-01-08 HISTORY — DX: Unspecified osteoarthritis, unspecified site: M19.90

## 2022-01-08 LAB — CBC
HCT: 40.8 % (ref 39.0–52.0)
Hemoglobin: 13.6 g/dL (ref 13.0–17.0)
MCH: 28.7 pg (ref 26.0–34.0)
MCHC: 33.3 g/dL (ref 30.0–36.0)
MCV: 86.1 fL (ref 80.0–100.0)
Platelets: 278 10*3/uL (ref 150–400)
RBC: 4.74 MIL/uL (ref 4.22–5.81)
RDW: 12.7 % (ref 11.5–15.5)
WBC: 5.4 10*3/uL (ref 4.0–10.5)
nRBC: 0 % (ref 0.0–0.2)

## 2022-01-08 NOTE — Progress Notes (Signed)
PCP - Dr. Sharilyn Sites Cardiologist - denies  PPM/ICD - n/a Device Orders - n/a Rep Notified - n/a  Chest x-ray - n/a EKG - Had one about a year ago, normal per patient Stress Test - Had one over 5 years ago, normal per patient ECHO - pt denies Cardiac Cath - pt denies  Sleep Study - Positive OSA. Wears CPAP nightly. Pressure setting 15  DM - patient denies  Blood Thinner Instructions: n/a Aspirin Instructions: n/a  ERAS Protcol - Clear Liquids until 0920 day of surgery PRE-SURGERY Ensure or G2- Ensure  COVID TEST- n/a   Anesthesia review: Yes. Clearance note in shadow chart  Patient denies shortness of breath, fever, cough and chest pain at PAT appointment   All instructions explained to the patient, with a verbal understanding of the material. Patient agrees to go over the instructions while at home for a better understanding. Patient also instructed to self quarantine after being tested for COVID-19. The opportunity to ask questions was provided.

## 2022-01-15 ENCOUNTER — Other Ambulatory Visit (HOSPITAL_COMMUNITY): Payer: Self-pay

## 2022-01-15 ENCOUNTER — Other Ambulatory Visit: Payer: Self-pay

## 2022-01-15 ENCOUNTER — Ambulatory Visit (HOSPITAL_COMMUNITY)
Admission: RE | Admit: 2022-01-15 | Discharge: 2022-01-15 | Disposition: A | Discharge status: Home / Self Care | Payer: Commercial Managed Care - PPO | Attending: Orthopedic Surgery | Admitting: Orthopedic Surgery

## 2022-01-15 ENCOUNTER — Ambulatory Visit (HOSPITAL_BASED_OUTPATIENT_CLINIC_OR_DEPARTMENT_OTHER): Payer: Commercial Managed Care - PPO | Admitting: Anesthesiology

## 2022-01-15 ENCOUNTER — Ambulatory Visit (HOSPITAL_COMMUNITY): Payer: Commercial Managed Care - PPO | Admitting: Physician Assistant

## 2022-01-15 ENCOUNTER — Encounter (HOSPITAL_COMMUNITY): Payer: Self-pay | Admitting: Orthopedic Surgery

## 2022-01-15 ENCOUNTER — Encounter (HOSPITAL_COMMUNITY)
Admission: RE | Disposition: A | Discharge status: Home / Self Care | Payer: Self-pay | Source: Home / Self Care | Attending: Orthopedic Surgery

## 2022-01-15 DIAGNOSIS — Z6841 Body Mass Index (BMI) 40.0 and over, adult: Secondary | ICD-10-CM | POA: Insufficient documentation

## 2022-01-15 DIAGNOSIS — X58XXXA Exposure to other specified factors, initial encounter: Secondary | ICD-10-CM | POA: Diagnosis not present

## 2022-01-15 DIAGNOSIS — S83281A Other tear of lateral meniscus, current injury, right knee, initial encounter: Secondary | ICD-10-CM | POA: Insufficient documentation

## 2022-01-15 DIAGNOSIS — K219 Gastro-esophageal reflux disease without esophagitis: Secondary | ICD-10-CM | POA: Insufficient documentation

## 2022-01-15 DIAGNOSIS — Z9989 Dependence on other enabling machines and devices: Secondary | ICD-10-CM

## 2022-01-15 DIAGNOSIS — M1990 Unspecified osteoarthritis, unspecified site: Secondary | ICD-10-CM

## 2022-01-15 DIAGNOSIS — S83241A Other tear of medial meniscus, current injury, right knee, initial encounter: Secondary | ICD-10-CM | POA: Insufficient documentation

## 2022-01-15 DIAGNOSIS — G4733 Obstructive sleep apnea (adult) (pediatric): Secondary | ICD-10-CM

## 2022-01-15 DIAGNOSIS — G4730 Sleep apnea, unspecified: Secondary | ICD-10-CM | POA: Diagnosis not present

## 2022-01-15 HISTORY — PX: KNEE ARTHROSCOPY WITH MENISCAL REPAIR: SHX5653

## 2022-01-15 SURGERY — ARTHROSCOPY, KNEE, WITH MENISCUS REPAIR
Anesthesia: Regional | Site: Knee | Laterality: Right

## 2022-01-15 MED ORDER — FENTANYL CITRATE (PF) 100 MCG/2ML IJ SOLN
25.0000 ug | INTRAMUSCULAR | Status: DC | PRN
  Administered 2022-01-15: 25 ug via INTRAVENOUS

## 2022-01-15 MED ORDER — OXYCODONE HCL 5 MG PO TABS: 5.0000 mg | ORAL_TABLET | Freq: Once | ORAL | Status: AC | PRN

## 2022-01-15 MED ORDER — FENTANYL CITRATE (PF) 250 MCG/5ML IJ SOLN
INTRAMUSCULAR | Status: DC | PRN
Start: 2022-01-15 — End: 2022-01-15
  Administered 2022-01-15 (×7): 25 ug via INTRAVENOUS

## 2022-01-15 MED ORDER — PROPOFOL 10 MG/ML IV BOLUS
INTRAVENOUS | Status: DC | PRN
  Administered 2022-01-15: 20 mg via INTRAVENOUS
  Administered 2022-01-15: 200 mg via INTRAVENOUS
  Administered 2022-01-15: 50 mg via INTRAVENOUS

## 2022-01-15 MED ORDER — ACETAMINOPHEN 10 MG/ML IV SOLN: 1000.0000 mg | Freq: Once | INTRAVENOUS | Status: DC | PRN

## 2022-01-15 MED ORDER — OXYCODONE HCL 5 MG PO TABS
5.0000 mg | ORAL_TABLET | Freq: Four times a day (QID) | ORAL | 0 refills | Status: DC | PRN
  Filled 2022-01-15: qty 6, 2d supply, fill #0
  Filled 2022-01-15: qty 22, 5d supply, fill #0

## 2022-01-15 MED ORDER — EPINEPHRINE PF 1 MG/ML IJ SOLN
INTRAMUSCULAR | Status: AC
  Filled 2022-01-15: qty 1

## 2022-01-15 MED ORDER — FENTANYL CITRATE (PF) 100 MCG/2ML IJ SOLN
INTRAMUSCULAR | Status: AC
  Administered 2022-01-15: 100 ug via INTRAVENOUS
  Filled 2022-01-15: qty 2

## 2022-01-15 MED ORDER — LACTATED RINGERS IV SOLN: INTRAVENOUS | Status: DC

## 2022-01-15 MED ORDER — DEXAMETHASONE SODIUM PHOSPHATE 10 MG/ML IJ SOLN
INTRAMUSCULAR | Status: AC
  Filled 2022-01-15: qty 1

## 2022-01-15 MED ORDER — ORAL CARE MOUTH RINSE: 15.0000 mL | Freq: Once | OROMUCOSAL | Status: AC

## 2022-01-15 MED ORDER — BUPIVACAINE-EPINEPHRINE (PF) 0.25% -1:200000 IJ SOLN
INTRAMUSCULAR | Status: AC
  Filled 2022-01-15: qty 30

## 2022-01-15 MED ORDER — LIDOCAINE 2% (20 MG/ML) 5 ML SYRINGE
INTRAMUSCULAR | Status: AC
Start: 2022-01-15 — End: ?
  Filled 2022-01-15: qty 5

## 2022-01-15 MED ORDER — FENTANYL CITRATE (PF) 250 MCG/5ML IJ SOLN
INTRAMUSCULAR | Status: AC
  Filled 2022-01-15: qty 5

## 2022-01-15 MED ORDER — LIDOCAINE 2% (20 MG/ML) 5 ML SYRINGE
INTRAMUSCULAR | Status: DC | PRN
  Administered 2022-01-15: 60 mg via INTRAVENOUS

## 2022-01-15 MED ORDER — ACETAMINOPHEN 10 MG/ML IV SOLN
INTRAVENOUS | Status: DC | PRN
  Administered 2022-01-15: 1000 mg via INTRAVENOUS

## 2022-01-15 MED ORDER — ONDANSETRON HCL 4 MG/2ML IJ SOLN
INTRAMUSCULAR | Status: DC | PRN
  Administered 2022-01-15: 4 mg via INTRAVENOUS

## 2022-01-15 MED ORDER — PROPOFOL 10 MG/ML IV BOLUS
INTRAVENOUS | Status: AC
  Filled 2022-01-15: qty 20

## 2022-01-15 MED ORDER — MIDAZOLAM HCL 2 MG/2ML IJ SOLN
INTRAMUSCULAR | Status: AC
  Administered 2022-01-15: 2 mg via INTRAVENOUS
  Filled 2022-01-15: qty 2

## 2022-01-15 MED ORDER — DEXAMETHASONE SODIUM PHOSPHATE 10 MG/ML IJ SOLN
INTRAMUSCULAR | Status: DC | PRN
  Administered 2022-01-15: 5 mg via INTRAVENOUS

## 2022-01-15 MED ORDER — MIDAZOLAM HCL 2 MG/2ML IJ SOLN
2.0000 mg | Freq: Once | INTRAMUSCULAR | Status: AC
Start: 2022-01-15 — End: 2022-01-15
  Filled 2022-01-15: qty 2

## 2022-01-15 MED ORDER — CHLORHEXIDINE GLUCONATE 0.12 % MT SOLN
15.0000 mL | Freq: Once | OROMUCOSAL | Status: AC
  Administered 2022-01-15: 15 mL via OROMUCOSAL
  Filled 2022-01-15: qty 15

## 2022-01-15 MED ORDER — OXYCODONE HCL 5 MG/5ML PO SOLN: 5.0000 mg | Freq: Once | ORAL | Status: AC | PRN

## 2022-01-15 MED ORDER — ACETAMINOPHEN 160 MG/5ML PO SOLN: 1000.0000 mg | Freq: Once | ORAL | Status: DC | PRN

## 2022-01-15 MED ORDER — ACETAMINOPHEN 500 MG PO TABS: 1000.0000 mg | ORAL_TABLET | Freq: Once | ORAL | Status: DC | PRN

## 2022-01-15 MED ORDER — EPINEPHRINE PF 1 MG/ML IJ SOLN
INTRAMUSCULAR | Status: DC | PRN
  Administered 2022-01-15 (×3): 1 mg

## 2022-01-15 MED ORDER — OXYCODONE HCL 5 MG PO TABS
ORAL_TABLET | ORAL | Status: AC
  Administered 2022-01-15: 5 mg via ORAL
  Filled 2022-01-15: qty 1

## 2022-01-15 MED ORDER — DEXAMETHASONE SODIUM PHOSPHATE 10 MG/ML IJ SOLN
INTRAMUSCULAR | Status: DC | PRN
  Administered 2022-01-15: 10 mg

## 2022-01-15 MED ORDER — CEFAZOLIN SODIUM-DEXTROSE 2-4 GM/100ML-% IV SOLN
2.0000 g | INTRAVENOUS | Status: AC
  Administered 2022-01-15: 2 g via INTRAVENOUS
  Filled 2022-01-15: qty 100

## 2022-01-15 MED ORDER — FENTANYL CITRATE (PF) 100 MCG/2ML IJ SOLN: 25.0000 ug | INTRAMUSCULAR | Status: DC | PRN

## 2022-01-15 MED ORDER — EPINEPHRINE PF 1 MG/ML IJ SOLN
INTRAMUSCULAR | Status: AC
Start: 2022-01-15 — End: ?
  Filled 2022-01-15: qty 2

## 2022-01-15 MED ORDER — FENTANYL CITRATE (PF) 100 MCG/2ML IJ SOLN
INTRAMUSCULAR | Status: AC
  Filled 2022-01-15: qty 2

## 2022-01-15 MED ORDER — ROPIVACAINE HCL 5 MG/ML IJ SOLN
INTRAMUSCULAR | Status: DC | PRN
  Administered 2022-01-15: 30 mL via PERINEURAL

## 2022-01-15 MED ORDER — FENTANYL CITRATE (PF) 100 MCG/2ML IJ SOLN
100.0000 ug | Freq: Once | INTRAMUSCULAR | Status: AC
  Filled 2022-01-15: qty 2

## 2022-01-15 MED ORDER — SODIUM CHLORIDE 0.9 % IR SOLN
Status: DC | PRN
  Administered 2022-01-15 (×3): 3000 mL

## 2022-01-15 MED ORDER — PROMETHAZINE HCL 12.5 MG PO TABS
12.5000 mg | ORAL_TABLET | Freq: Four times a day (QID) | ORAL | 0 refills | Status: DC | PRN
  Filled 2022-01-15: qty 30, 8d supply, fill #0

## 2022-01-15 MED ORDER — ACETAMINOPHEN 10 MG/ML IV SOLN
INTRAVENOUS | Status: AC
Start: 2022-01-15 — End: ?
  Filled 2022-01-15: qty 100

## 2022-01-15 MED ORDER — ONDANSETRON HCL 4 MG/2ML IJ SOLN
INTRAMUSCULAR | Status: AC
  Filled 2022-01-15: qty 2

## 2022-01-15 MED ORDER — DEXMEDETOMIDINE (PRECEDEX) IN NS 20 MCG/5ML (4 MCG/ML) IV SYRINGE
PREFILLED_SYRINGE | INTRAVENOUS | Status: DC | PRN
  Administered 2022-01-15: 8 ug via INTRAVENOUS

## 2022-01-15 MED ORDER — BUPIVACAINE-EPINEPHRINE (PF) 0.25% -1:200000 IJ SOLN
INTRAMUSCULAR | Status: DC | PRN
  Administered 2022-01-15: 10 mL

## 2022-01-15 SURGICAL SUPPLY — 70 items
ALCOHOL 70% 16 OZ (MISCELLANEOUS) ×2 IMPLANT
ANCHOR SUT 1.8 FIBERTAK SB KL (Anchor) ×1 IMPLANT
BAG COUNTER SPONGE SURGICOUNT (BAG) ×2 IMPLANT
BANDAGE ESMARK 6X9 LF (GAUZE/BANDAGES/DRESSINGS) IMPLANT
BIT DRILL CANN W/ LASSO 2.4 (BIT) IMPLANT
BLADE CLIPPER SURG (BLADE) IMPLANT
BLADE SURG 10 STRL SS (BLADE) ×2 IMPLANT
BLADE SURG 15 STRL LF DISP TIS (BLADE) ×1 IMPLANT
BLADE SURG 15 STRL SS (BLADE) ×2
BNDG ELASTIC 6X10 VLCR STRL LF (GAUZE/BANDAGES/DRESSINGS) ×1 IMPLANT
BNDG ELASTIC 6X5.8 VLCR STR LF (GAUZE/BANDAGES/DRESSINGS) IMPLANT
BNDG ESMARK 6X9 LF (GAUZE/BANDAGES/DRESSINGS)
COVER SURGICAL LIGHT HANDLE (MISCELLANEOUS) ×2 IMPLANT
CUFF TOURN SGL QUICK 34 (TOURNIQUET CUFF) ×2
CUFF TRNQT CYL 34X4.125X (TOURNIQUET CUFF) IMPLANT
CUTTER BONE 4.0MM X 13CM (MISCELLANEOUS) ×1 IMPLANT
DISSECTOR 4.0MMX13CM CVD (MISCELLANEOUS) ×1 IMPLANT
DRAPE HALF SHEET 40X57 (DRAPES) IMPLANT
DRAPE INCISE IOBAN 66X45 STRL (DRAPES) ×2 IMPLANT
DRAPE U-SHAPE 47X51 STRL (DRAPES) ×2 IMPLANT
DRILL BIT CANN W/ LASSO 2.4 (BIT) ×1
DRSG PAD ABDOMINAL 8X10 ST (GAUZE/BANDAGES/DRESSINGS) ×1 IMPLANT
DRSG TEGADERM 4X4.75 (GAUZE/BANDAGES/DRESSINGS) ×6 IMPLANT
DW OUTFLOW CASSETTE/TUBE SET (MISCELLANEOUS) ×2 IMPLANT
ELECT REM PT RETURN 9FT ADLT (ELECTROSURGICAL) ×2
ELECTRODE REM PT RTRN 9FT ADLT (ELECTROSURGICAL) ×1 IMPLANT
GAUZE SPONGE 4X4 12PLY STRL (GAUZE/BANDAGES/DRESSINGS) ×1 IMPLANT
GAUZE XEROFORM 1X8 LF (GAUZE/BANDAGES/DRESSINGS) IMPLANT
GLOVE BIOGEL PI IND STRL 7.0 (GLOVE) ×1 IMPLANT
GLOVE BIOGEL PI IND STRL 8 (GLOVE) ×1 IMPLANT
GLOVE BIOGEL PI INDICATOR 7.0 (GLOVE) ×1
GLOVE BIOGEL PI INDICATOR 8 (GLOVE) ×1
GLOVE ECLIPSE 7.0 STRL STRAW (GLOVE) ×2 IMPLANT
GLOVE ECLIPSE 8.0 STRL XLNG CF (GLOVE) ×2 IMPLANT
GOWN STRL REUS W/ TWL LRG LVL3 (GOWN DISPOSABLE) ×2 IMPLANT
GOWN STRL REUS W/ TWL XL LVL3 (GOWN DISPOSABLE) ×1 IMPLANT
GOWN STRL REUS W/TWL LRG LVL3 (GOWN DISPOSABLE) ×4
GOWN STRL REUS W/TWL XL LVL3 (GOWN DISPOSABLE) ×2
KIT BASIN OR (CUSTOM PROCEDURE TRAY) ×2 IMPLANT
KIT CVD SPEAR FBRTK 1.8 DRILL (KITS) ×1 IMPLANT
KIT ROOT REPAIR MEINISCAL PEEK (Anchor) IMPLANT
KIT TURNOVER KIT B (KITS) ×2 IMPLANT
MANIFOLD NEPTUNE II (INSTRUMENTS) IMPLANT
MEINISCAL ROOT REPAIR KIT PEEK (Anchor) ×2 IMPLANT
NDL 18GX1X1/2 (RX/OR ONLY) (NEEDLE) IMPLANT
NDL HYPO 25GX1X1/2 BEV (NEEDLE) ×1 IMPLANT
NEEDLE 18GX1X1/2 (RX/OR ONLY) (NEEDLE) ×2 IMPLANT
NEEDLE HYPO 25GX1X1/2 BEV (NEEDLE) ×2 IMPLANT
NS IRRIG 1000ML POUR BTL (IV SOLUTION) ×2 IMPLANT
PACK ARTHROSCOPY DSU (CUSTOM PROCEDURE TRAY) ×2 IMPLANT
PAD ARMBOARD 7.5X6 YLW CONV (MISCELLANEOUS) ×4 IMPLANT
PADDING CAST COTTON 6X4 STRL (CAST SUPPLIES) ×2 IMPLANT
PENCIL BUTTON HOLSTER BLD 10FT (ELECTRODE) ×2 IMPLANT
PORT APPOLLO RF 90DEGREE MULTI (SURGICAL WAND) ×1 IMPLANT
SOL PREP POV-IOD 4OZ 10% (MISCELLANEOUS) ×2 IMPLANT
SPONGE T-LAP 4X18 ~~LOC~~+RFID (SPONGE) ×2 IMPLANT
STRIP CLOSURE SKIN 1/2X4 (GAUZE/BANDAGES/DRESSINGS) ×1 IMPLANT
SUT 0 FIBERLINK 1.5 FWIRE CLS (SUTURE) ×8
SUT LASSO 45 DEG R (SUTURE) ×1 IMPLANT
SUT MNCRL AB 3-0 PS2 27 (SUTURE) ×1 IMPLANT
SUT MON AB 2-0 CT1 36 (SUTURE) ×1 IMPLANT
SUTURE 0 FIBRLNK 1.5 FWIRE CLS (SUTURE) IMPLANT
SYR 20ML ECCENTRIC (SYRINGE) ×2 IMPLANT
SYR CONTROL 10ML LL (SYRINGE) IMPLANT
SYR TB 1ML LUER SLIP (SYRINGE) ×2 IMPLANT
TOWEL GREEN STERILE (TOWEL DISPOSABLE) ×2 IMPLANT
TOWEL GREEN STERILE FF (TOWEL DISPOSABLE) ×2 IMPLANT
TUBE CONNECTING 12X1/4 (SUCTIONS) ×2 IMPLANT
TUBING ARTHROSCOPY IRRIG 16FT (MISCELLANEOUS) ×2 IMPLANT
WATER STERILE IRR 1000ML POUR (IV SOLUTION) ×2 IMPLANT

## 2022-01-15 NOTE — Progress Notes (Signed)
Orthopedic Tech Progress Note Patient Details:  Jacob House 1966-03-23 [id number]  PACU RN called requesting CRUTCHES for patient   Ortho Devices Type of Ortho Device: Crutches Ortho Device/Splint Interventions: Ordered, Application, Adjustment   Post Interventions Patient Tolerated: Well Instructions Provided: Care of device  Janit Pagan 01/15/2022, 5:10 PM

## 2022-01-15 NOTE — Anesthesia Procedure Notes (Signed)
Procedure Name: LMA Insertion Date/Time: 01/15/2022 12:55 PM  Performed by: Erick Colace, CRNAPre-anesthesia Checklist: Patient identified, Emergency Drugs available, Suction available and Patient being monitored Patient Re-evaluated:Patient Re-evaluated prior to induction Oxygen Delivery Method: Circle system utilized Preoxygenation: Pre-oxygenation with 100% oxygen Induction Type: IV induction Ventilation: Mask ventilation without difficulty LMA: LMA inserted LMA Size: 4.0 Laser Tube: Cuffed inflated with minimal occlusive pressure - saline Placement Confirmation: positive ETCO2 and breath sounds checked- equal and bilateral Tube secured with: Tape Dental Injury: Teeth and Oropharynx as per pre-operative assessment

## 2022-01-15 NOTE — Transfer of Care (Signed)
Immediate Anesthesia Transfer of Care Note  Patient: Jacob House  Procedure(s) Performed: RIGHT KNEE MEDIAL MENISCUS ROOT REPAIR (Right: Knee)  Patient Location: PACU  Anesthesia Type:General  Level of Consciousness: drowsy  Airway & Oxygen Therapy: Patient Spontanous Breathing and Patient connected to face mask oxygen  Post-op Assessment: Report given to RN and Post -op Vital signs reviewed and stable  Post vital signs: Reviewed and stable  Last Vitals:  Vitals Value Taken Time  BP 136/66 01/15/22 1555  Temp    Pulse 96 01/15/22 1554  Resp 13 01/15/22 1555  SpO2 93 % 01/15/22 1554  Vitals shown include unvalidated device data.  Last Pain:  Vitals:   01/15/22 1119  TempSrc:   PainSc: 2       Patients Stated Pain Goal: 2 (41/28/20 8138)  Complications: No notable events documented.

## 2022-01-15 NOTE — Anesthesia Preprocedure Evaluation (Addendum)
Anesthesia Evaluation  Patient identified by MRN, date of birth, ID band Patient awake    Reviewed: Allergy & Precautions, NPO status , Patient's Chart, lab work & pertinent test results  Airway Mallampati: III  TM Distance: >3 FB Neck ROM: Full    Dental  (+) Teeth Intact, Missing, Dental Advisory Given,    Pulmonary sleep apnea and Continuous Positive Airway Pressure Ventilation ,    Pulmonary exam normal breath sounds clear to auscultation       Cardiovascular negative cardio ROS Normal cardiovascular exam Rhythm:Regular Rate:Normal     Neuro/Psych negative neurological ROS  negative psych ROS   GI/Hepatic Neg liver ROS, GERD  Controlled,  Endo/Other  Morbid obesityBMI 40  Renal/GU negative Renal ROS  negative genitourinary   Musculoskeletal  (+) Arthritis , Osteoarthritis,    Abdominal (+) + obese,   Peds  Hematology negative hematology ROS (+)   Anesthesia Other Findings   Reproductive/Obstetrics negative OB ROS                           Anesthesia Physical Anesthesia Plan  ASA: 3  Anesthesia Plan: General and Regional   Post-op Pain Management: Regional block* and Tylenol PO (pre-op)*   Induction: Intravenous  PONV Risk Score and Plan: 2 and Dexamethasone, Ondansetron, Midazolam and Treatment may vary due to age or medical condition  Airway Management Planned: LMA  Additional Equipment: None  Intra-op Plan:   Post-operative Plan: Extubation in OR  Informed Consent: I have reviewed the patients History and Physical, chart, labs and discussed the procedure including the risks, benefits and alternatives for the proposed anesthesia with the patient or authorized representative who has indicated his/her understanding and acceptance.     Dental advisory given  Plan Discussed with: CRNA  Anesthesia Plan Comments:         Anesthesia Quick Evaluation
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
## 2022-01-15 NOTE — Anesthesia Procedure Notes (Signed)
Anesthesia Regional Block: Adductor canal block   Pre-Anesthetic Checklist: , timeout performed,  Correct Patient, Correct Site, Correct Laterality,  Correct Procedure, Correct Position, site marked,  Risks and benefits discussed,  Surgical consent,  Pre-op evaluation,  At surgeon's request and post-op pain management  Laterality: Right  Prep: Maximum Sterile Barrier Precautions used, chloraprep       Needles:  Injection technique: Single-shot  Needle Type: Echogenic Stimulator Needle     Needle Length: 9cm  Needle Gauge: 22     Additional Needles:   Procedures:,,,, ultrasound used (permanent image in chart),,    Narrative:  Start time: 01/15/2022 12:00 PM End time: 01/15/2022 12:10 PM Injection made incrementally with aspirations every 5 mL.  Performed by: Personally  Anesthesiologist: Pervis Hocking, DO  Additional Notes: Monitors applied. No increased pain on injection. No increased resistance to injection. Injection made in 5cc increments. Good needle visualization. Patient tolerated procedure well.

## 2022-01-15 NOTE — H&P (Signed)
PREOPERATIVE H&P  HPI: Jacob House is a 56 y.o. male who has presented today for surgery, with the diagnosis of right knee medial meniscus root tear.  The various methods of treatment have been discussed with the patient and family.  After consideration of risks, benefits, and other options for treatment, the patient has consented to [facility] as a surgical intervention.  The patient's history has been reviewed, patient examined, no change in status, stable for surgery.  I have reviewed the patient's chart and labs.  Questions were answered to the patient's satisfaction.    PMH: Past Medical History:  Diagnosis Date   Allergy    Arthritis    Carpal tunnel syndrome    Complication of anesthesia 04/23/2013   pt told by anesthesia after probably has sleep apnea   Complication of anesthesia    cannot lie on left side due to recent shoulder surgery per pt   Family history of colon cancer 06/05/2021   Family history of Lynch syndrome 06/05/2021   GERD (gastroesophageal reflux disease)    Lynch syndrome    OSA on CPAP 12/08/2013   Snoring     Home Medications Allergies  No current facility-administered medications on file prior to encounter.   Current Outpatient Medications on File Prior to Encounter  Medication Sig Dispense Refill   celecoxib (CELEBREX) 200 MG capsule Take 200 mg by mouth daily as needed for pain.     NON FORMULARY Pt uses a cpap nightly     No Known Allergies   PSH: Past Surgical History:  Procedure Laterality Date   ANAL FISSURE REPAIR  july 2009 and yrs ago   x 2    CHOLECYSTECTOMY  1999   COLONOSCOPY WITH PROPOFOL N/A 07/22/2013   Procedure: COLONOSCOPY WITH PROPOFOL;  Surgeon: Cleotis Nipper, MD;  Location: [facility] END[facility];  Service: Endoscopy;  Laterality: N/A;   ESOPHAGOGASTRODUODENOSCOPY (EGD) WITH PROPOFOL N/A 07/22/2013   Procedure: ESOPHAGOGASTRODUODENOSCOPY (EGD) WITH PROPOFOL;  Surgeon: Cleotis Nipper, MD;  Location: [facility]  ENDOSCOPY;  Service: Endoscopy;  Laterality: N/A;   EYE SURGERY Bilateral 2005   lasix   FRACTURE SURGERY Right    foot   GALLBLADDER SURGERY     SHOULDER SURGERY Left sept 2014   cartlidge repair     Family History Social History  Family History  Problem Relation Age of Onset   Colon cancer Mother 53   Testicular cancer Father        d. 75   Colon cancer Maternal Uncle        d. 68   Colon cancer Maternal Uncle        dx 18s; Lynch syndrome   Heart disease Maternal Uncle    Colon cancer Maternal Grandmother        dx before 25   Breast cancer Maternal Grandmother        dx 37s   Colon cancer Maternal Grandfather        dx before 92    Social History   Socioeconomic History   Marital status: Significant Other    Spouse name: Juliann Pulse   Number of children: 0   Years of education: 16   Highest education level: Not on file  Occupational History   Occupation: Glass blower/designer    Employer: [employer]  Tobacco Use   Smoking status: Never   Smokeless tobacco: Never  Vaping Use   Vaping Use: Never used  Substance and Sexual Activity   Alcohol  use: Yes    Alcohol/week: 0.0 - 1.0 standard drinks of alcohol    Comment: rare   Drug use: No   Sexual activity: Yes    Partners: Female    Birth control/protection: Condom    Comment: 1 sexual partner in last year  Other Topics Concern   Not on file  Social History Narrative   Lives with his significant other, Juliann Pulse, and their cats.   Social Determinants of Health   Financial Resource Strain: Not on file  Food Insecurity: Not on file  Transportation Needs: Not on file  Physical Activity: Not on file  Stress: Not on file  Social Connections: Not on file     Review of Systems: MSK: As noted per HPI above GI: No current Nausea/vomiting ENT: Denies sore throat, epistaxis CV: Denies chest pain Resp: No current shortness of breath  Other than mentioned above, there are no Constitutional, Neurological, Psychiatric,  ENT, Ophthalmological, Cardiovascular, Respiratory, GI, GU, Musculoskeletal, Integumentary, Lymphatic, Endocrine or Allergic issues.   Physical Examination: CV: Normal distal pulses Lungs: Unlabored respirations RLE: Examination of the right knee demonstrates skin is intact benign.  There is an effusion present.  There is tenderness over the medial joint line.  There is a positive medial McMurray's, and pain with hyperflexion/Steinmann testing.  Apley and Thessaly test reproduce pain along the medial joint line.  No instability to varus or valgus stress in 2 degrees or 30 degrees.  Stable Lachman.  Stable anterior drawer posterior drawer.  Normal quadriceps muscle strength.  Normal patellar tracking.  Normal dorsiflexion, plantarflexion, and EHL function and strength.  Sensation intact light touch in the superficial peroneal, deep peroneal, and tibial distributions distally.  Normal DP pulse.  Warm and well-perfused distally.  Assessment/Plan: RIGHT KNEE MEDIAL MENISCUS ROOT REPAIR    Georgeanna Harrison M.D. Orthopaedic Surgery [facility]  Review of this patient's medications prescribed by other providers does not in any way constitute an endorsement by this clinician of their use, indications, dosage, route, efficacy, interactions, or other clinical parameters.  Portions of the record have been created with voice recognition software.  Grammatical and punctuation errors, random word insertions, wrong-word or "sound-a-like" substitutions, pronoun errors (inaccuracies and/or substitutions), and/or incomplete sentences may have occurred due to the inherent limitations of voice recognition software.  Not all errors are caught or corrected.  Although every attempt is made to root out erroneous and incomplete transcription, the note may still not fully represent the intent or opinion of the author.  Read the chart carefully and recognize, using context, where  errors/substitutions have occurred.  Any questions or concerns about the content of this note or information contained within the body of this dictation should be addressed directly with the author for clarification.

## 2022-01-15 NOTE — Discharge Instructions (Signed)
Discharge instructions for Dr. Georgeanna Harrison, M.D.: Please refer to the two-sided discharge instructions paper that Dr. Mable Fill placed in the patient's paper chart. Please give this to the patient to take home after reviewing with the patient!!   General discharge instructions:  PLEASE REFER TO TWO-SIDED PAPER INSTRUCTIONS IN [place]!!!  Diet: As you were doing prior to hospitalization. Shower:  Unless otherwise specified (i.e. on two-sided paper instructions with paper chart) may shower but keep the wounds dry, use an occlusive plastic wrap, NO SOAKING IN TUB.  If the bandage gets wet, change with a clean dry gauze. Dressing:  Unless otherwise specified (i.e. on two-sided paper instructions with paper chart), may change your dressing 3-5 days after surgery.  Then change the dressing daily with sterile gauze dressing.  If there are sticky tapes (steri-strips) on your wounds and all the stitches are absorbable.  Leave the steri-strips in place when changing your dressings, they will peel off with time, usually 2-3 weeks. Activity:  Increase activity slowly as tolerated, but follow the restrictions on the two-sided paper discharge instructions sheet that Dr. Mable Fill placed in the paper chart.  No lifting or driving for 6 weeks. Weight Bearing: NON-WEIGHTBEARING (NWB) on the LEFT LOWER EXTREMITY. To prevent constipation: You may use over-the-counter stool softener(s) such as Colace (over the counter) 100 mg by mouth twice a day and/or Miralax (over the counter) for constipation as needed.  Drink plenty of fluids (prune juice may be helpful) and high fiber foods.  Itching:  If you experience itching with your medications, try taking only a single pain pill, or even half a pain pill at a time.  You can also use benadryl over the counter for itching or also to help with sleep.  Precautions:  If you experience chest pain or shortness of breath - call 911 immediately for transfer to  the hospital emergency department!!  PLEASE REFER TO TWO-SIDED PAPER INSTRUCTIONS IN PAPER CHART FOR SPECIFIC INSTRUCTIONS!!!  If you develop a fever greater that 101.1 deg F, purulent drainage from wound, increased redness or drainage from wound, or calf pain -- Call the office at [phone].

## 2022-01-16 ENCOUNTER — Encounter (HOSPITAL_COMMUNITY): Payer: Self-pay | Admitting: Orthopedic Surgery

## 2022-01-16 NOTE — Anesthesia Postprocedure Evaluation (Signed)
Anesthesia Post Note  Patient: Jacob House  Procedure(s) Performed: RIGHT KNEE MEDIAL MENISCUS ROOT REPAIR (Right: Knee)     Patient location during evaluation: PACU Anesthesia Type: Regional and General Level of consciousness: awake and alert Pain management: pain level controlled Vital Signs Assessment: post-procedure vital signs reviewed and stable Respiratory status: spontaneous breathing, nonlabored ventilation, respiratory function stable and patient connected to nasal cannula oxygen Cardiovascular status: blood pressure returned to baseline and stable Postop Assessment: no apparent nausea or vomiting Anesthetic complications: no   No notable events documented.  Last Vitals:  Vitals:   01/15/22 1625 01/15/22 1640  BP: 111/66 117/66  Pulse: 77 69  Resp: 16 12  Temp:    SpO2: 96% 94%    Last Pain:  Vitals:   01/15/22 1625  TempSrc:   PainSc: 6                  Deane Melick

## 2022-04-01 ENCOUNTER — Other Ambulatory Visit: Payer: Self-pay | Admitting: Orthopedic Surgery

## 2022-04-05 ENCOUNTER — Other Ambulatory Visit (HOSPITAL_COMMUNITY): Payer: Self-pay

## 2022-04-09 DIAGNOSIS — R2240 Localized swelling, mass and lump, unspecified lower limb: Secondary | ICD-10-CM | POA: Diagnosis not present

## 2022-04-09 DIAGNOSIS — Z6841 Body Mass Index (BMI) 40.0 and over, adult: Secondary | ICD-10-CM | POA: Diagnosis not present

## 2022-04-09 DIAGNOSIS — A46 Erysipelas: Secondary | ICD-10-CM | POA: Diagnosis not present

## 2022-04-09 DIAGNOSIS — Z9889 Other specified postprocedural states: Secondary | ICD-10-CM | POA: Diagnosis not present

## 2022-04-15 NOTE — Pre-Procedure Instructions (Signed)
Surgical Instructions    Your procedure is scheduled on Thursday, April 25, 2022 at 7:30 AM.  Report to [facility] Main Entrance "A" at 5:30 A.M., then check in with the Admitting office.  Call this number if you have problems the morning of surgery:  [phone]   If you have any questions prior to your surgery date call [phone]: Open Monday-Friday 8am-4pm    Remember:  Do not eat after midnight the night before your surgery  You may drink clear liquids until 4:30 AM the morning of your surgery.   Clear liquids allowed are: Water, Non-Citrus Juices (without pulp), Carbonated Beverages, Clear Tea, Black Coffee Only (NO MILK, CREAM OR POWDERED CREAMER of any kind), and Gatorade.  Please complete your PRE-SURGERY ENSURE that was provided to you by 4:30 AM the morning of surgery.  Please, if able, drink it in one setting. DO NOT SIP.     Take these medicines the morning of surgery with A SIP OF WATER:  cephALEXin (KEFLEX)  IF NEEDED: fluticasone (FLONASE) promethazine (PHENERGAN)   As of today, STOP taking any Aspirin (unless otherwise instructed by your surgeon) Aleve, Naproxen, Ibuprofen, Motrin, Advil, Goody's, BC's, all herbal medications, fish oil, and all vitamins.                     Do NOT Smoke (Tobacco/Vaping) for 24 hours prior to your procedure.  If you use a CPAP at night, you may bring your mask/headgear for your overnight stay.   Contacts, glasses, piercing's, hearing aid's, dentures or partials may not be worn into surgery, please bring cases for these belongings.    For patients admitted to the hospital, discharge time will be determined by your treatment team.   Patients discharged the day of surgery will not be allowed to drive home, and someone needs to stay with them for 24 hours.  SURGICAL WAITING ROOM VISITATION Patients having surgery or a procedure may have two support people in the waiting area. Visitors may stay in the waiting area during  the procedure and switch out with other visitors if needed. Children under the age of 31 must have an adult accompany them who is not the patient. If the patient needs to stay at the hospital during part of their recovery, the visitor guidelines for inpatient rooms apply.  Please refer to the [facility] website for the visitor guidelines for Inpatients (after your surgery is over and you are in a regular room).    Special instructions:   [facility]- Preparing For Surgery  Before surgery, you can play an important role. Because skin is not sterile, your skin needs to be as free of germs as possible. You can reduce the number of germs on your skin by washing with CHG (chlorahexidine gluconate) Soap before surgery.  CHG is an antiseptic cleaner which kills germs and bonds with the skin to continue killing germs even after washing.    Oral Hygiene is also important to reduce your risk of infection.  Remember - BRUSH YOUR TEETH THE MORNING OF SURGERY WITH YOUR REGULAR TOOTHPASTE  Please do not use if you have an allergy to CHG or antibacterial soaps. If your skin becomes reddened/irritated stop using the CHG.  Do not shave (including legs and underarms) for at least 48 hours prior to first CHG shower. It is OK to shave your face.  Please follow these instructions carefully.   Shower the NIGHT BEFORE SURGERY and the MORNING OF SURGERY  If you  chose to wash your hair, wash your hair first as usual with your normal shampoo.  After you shampoo, rinse your hair and body thoroughly to remove the shampoo.  Use CHG Soap as you would any other liquid soap. You can apply CHG directly to the skin and wash gently with a scrungie or a clean washcloth.   Apply the CHG Soap to your body ONLY FROM THE NECK DOWN.  Do not use on open wounds or open sores. Avoid contact with your eyes, ears, mouth and genitals (private parts). Wash Face and genitals (private parts)  with your normal soap.   Wash thoroughly,  paying special attention to the area where your surgery will be performed.  Thoroughly rinse your body with warm water from the neck down.  DO NOT shower/wash with your normal soap after using and rinsing off the CHG Soap.  Pat yourself dry with a CLEAN TOWEL.  Wear CLEAN PAJAMAS to bed the night before surgery  Place CLEAN SHEETS on your bed the night before your surgery  DO NOT SLEEP WITH PETS.   Day of Surgery: Take a shower with CHG soap. Do not wear jewelry. Do not wear lotions, powders, colognes, or deodorant. Do not shave 48 hours prior to surgery.  Men may shave face and neck. Do not bring valuables to the hospital.  [facility] is not responsible for any belongings or valuables. Wear Clean/Comfortable clothing the morning of surgery Do not apply any deodorants/lotions.   Remember to brush your teeth WITH YOUR REGULAR TOOTHPASTE.   Please read over the following fact sheets that you were given.  If you received a COVID test during your pre-op visit  it is requested that you wear a mask when out in public, stay away from anyone that may not be feeling well and notify your surgeon if you develop symptoms. If you have been in contact with anyone that has tested positive in the last 10 days please notify you surgeon.

## 2022-04-16 ENCOUNTER — Encounter (HOSPITAL_COMMUNITY)
Admission: RE | Admit: 2022-04-16 | Discharge: 2022-04-16 | Disposition: A | Discharge status: Home / Self Care | Payer: 59 | Source: Ambulatory Visit | Attending: Orthopedic Surgery | Admitting: Orthopedic Surgery

## 2022-04-16 ENCOUNTER — Other Ambulatory Visit: Payer: Self-pay

## 2022-04-16 ENCOUNTER — Encounter (HOSPITAL_COMMUNITY): Payer: Self-pay

## 2022-04-16 VITALS — BP 137/79 | HR 76 | Temp 97.7°F | Resp 18 | Ht 67.0 in | Wt 250.0 lb

## 2022-04-16 DIAGNOSIS — Z01812 Encounter for preprocedural laboratory examination: Secondary | ICD-10-CM | POA: Insufficient documentation

## 2022-04-16 DIAGNOSIS — Z01818 Encounter for other preprocedural examination: Secondary | ICD-10-CM

## 2022-04-16 LAB — CBC
HCT: 40.2 % (ref 39.0–52.0)
Hemoglobin: 13.4 g/dL (ref 13.0–17.0)
MCH: 28.3 pg (ref 26.0–34.0)
MCHC: 33.3 g/dL (ref 30.0–36.0)
MCV: 85 fL (ref 80.0–100.0)
Platelets: 275 10*3/uL (ref 150–400)
RBC: 4.73 MIL/uL (ref 4.22–5.81)
RDW: 12.5 % (ref 11.5–15.5)
WBC: 5.8 10*3/uL (ref 4.0–10.5)
nRBC: 0 % (ref 0.0–0.2)

## 2022-04-16 NOTE — Progress Notes (Signed)
PCP - Sharilyn Sites Cardiologist - Denies  PPM/ICD - Denies Device Orders -  Rep Notified -   Chest x-ray - NI EKG - NI Stress Test - Denies ECHO - Denies Cardiac Cath - Denies  Sleep Study - Yes has OSA CPAP - Nightly  DM - Denies  Blood Thinner Instructions:Denies Aspirin Instructions:Denies  ERAS Protcol -Yes PRE-SURGERY Ensure given   COVID TEST- NI   Anesthesia review: No  Patient denies shortness of breath, fever, cough and chest pain at PAT appointment   All instructions explained to the patient, with a verbal understanding of the material. Patient agrees to go over the instructions while at home for a better understanding.  The opportunity to ask questions was provided.

## 2022-04-22 DIAGNOSIS — G4733 Obstructive sleep apnea (adult) (pediatric): Secondary | ICD-10-CM | POA: Diagnosis not present

## 2022-04-24 ENCOUNTER — Encounter (HOSPITAL_COMMUNITY): Payer: Self-pay | Admitting: Orthopedic Surgery

## 2022-04-24 NOTE — Anesthesia Preprocedure Evaluation (Signed)
Anesthesia Evaluation  Patient identified by MRN, date of birth, ID band Patient awake    Reviewed: Allergy & Precautions, NPO status , Patient's Chart, lab work & pertinent test results  Airway Mallampati: III  TM Distance: >3 FB Neck ROM: Full    Dental no notable dental hx. (+) Teeth Intact, Dental Advisory Given, Caps   Pulmonary sleep apnea and Continuous Positive Airway Pressure Ventilation ,    Pulmonary exam normal breath sounds clear to auscultation       Cardiovascular negative cardio ROS Normal cardiovascular exam Rhythm:Regular Rate:Normal     Neuro/Psych  Neuromuscular disease negative psych ROS   GI/Hepatic Neg liver ROS, GERD  Medicated,  Endo/Other  Morbid obesity  Renal/GU negative Renal ROS  negative genitourinary   Musculoskeletal  (+) Arthritis , Right knee arthrofibrosis   Abdominal (+) + obese,   Peds  Hematology negative hematology ROS (+)   Anesthesia Other Findings   Reproductive/Obstetrics                           Anesthesia Physical Anesthesia Plan  ASA: 3  Anesthesia Plan: General   Post-op Pain Management: Tylenol PO (pre-op)*, Precedex, Dilaudid IV and Regional block*   Induction: Intravenous  PONV Risk Score and Plan: 3 and Treatment may vary due to age or medical condition, Midazolam, Ondansetron and Dexamethasone  Airway Management Planned: LMA  Additional Equipment: None  Intra-op Plan:   Post-operative Plan: Extubation in OR  Informed Consent: I have reviewed the patients History and Physical, chart, labs and discussed the procedure including the risks, benefits and alternatives for the proposed anesthesia with the patient or authorized representative who has indicated his/her understanding and acceptance.     Dental advisory given  Plan Discussed with: CRNA and Anesthesiologist  Anesthesia Plan Comments:       Anesthesia Quick  Evaluation

## 2022-04-25 ENCOUNTER — Ambulatory Visit (HOSPITAL_COMMUNITY): Payer: 59 | Admitting: Anesthesiology

## 2022-04-25 ENCOUNTER — Other Ambulatory Visit (HOSPITAL_COMMUNITY): Payer: Self-pay

## 2022-04-25 ENCOUNTER — Ambulatory Visit (HOSPITAL_BASED_OUTPATIENT_CLINIC_OR_DEPARTMENT_OTHER): Payer: 59 | Admitting: Anesthesiology

## 2022-04-25 ENCOUNTER — Encounter (HOSPITAL_COMMUNITY)
Admission: RE | Disposition: A | Discharge status: Home / Self Care | Payer: Self-pay | Source: Home / Self Care | Attending: Orthopedic Surgery

## 2022-04-25 ENCOUNTER — Ambulatory Visit (HOSPITAL_COMMUNITY)
Admission: RE | Admit: 2022-04-25 | Discharge: 2022-04-25 | Disposition: A | Discharge status: Home / Self Care | Payer: 59 | Attending: Orthopedic Surgery | Admitting: Orthopedic Surgery

## 2022-04-25 ENCOUNTER — Encounter (HOSPITAL_COMMUNITY): Payer: Self-pay | Admitting: Orthopedic Surgery

## 2022-04-25 ENCOUNTER — Other Ambulatory Visit: Payer: Self-pay

## 2022-04-25 DIAGNOSIS — Z9889 Other specified postprocedural states: Secondary | ICD-10-CM | POA: Insufficient documentation

## 2022-04-25 DIAGNOSIS — Z9989 Dependence on other enabling machines and devices: Secondary | ICD-10-CM | POA: Diagnosis not present

## 2022-04-25 DIAGNOSIS — G4733 Obstructive sleep apnea (adult) (pediatric): Secondary | ICD-10-CM | POA: Diagnosis not present

## 2022-04-25 DIAGNOSIS — Z6839 Body mass index (BMI) 39.0-39.9, adult: Secondary | ICD-10-CM | POA: Diagnosis not present

## 2022-04-25 DIAGNOSIS — M25661 Stiffness of right knee, not elsewhere classified: Secondary | ICD-10-CM | POA: Diagnosis not present

## 2022-04-25 DIAGNOSIS — M24661 Ankylosis, right knee: Secondary | ICD-10-CM | POA: Diagnosis not present

## 2022-04-25 DIAGNOSIS — G709 Myoneural disorder, unspecified: Secondary | ICD-10-CM | POA: Diagnosis not present

## 2022-04-25 DIAGNOSIS — G8918 Other acute postprocedural pain: Secondary | ICD-10-CM | POA: Diagnosis not present

## 2022-04-25 HISTORY — PX: KNEE ARTHROSCOPY WITH MENISCAL REPAIR: SHX5653

## 2022-04-25 SURGERY — ARTHROSCOPY, KNEE, WITH MENISCUS REPAIR
Anesthesia: General | Site: Knee | Laterality: Right

## 2022-04-25 MED ORDER — ROPIVACAINE HCL 7.5 MG/ML IJ SOLN
INTRAMUSCULAR | Status: DC | PRN
  Administered 2022-04-25: 20 mL via PERINEURAL

## 2022-04-25 MED ORDER — EPINEPHRINE PF 1 MG/ML IJ SOLN
INTRAMUSCULAR | Status: AC
  Filled 2022-04-25: qty 6

## 2022-04-25 MED ORDER — LIDOCAINE 2% (20 MG/ML) 5 ML SYRINGE
INTRAMUSCULAR | Status: DC | PRN
  Administered 2022-04-25: 60 mg via INTRAVENOUS

## 2022-04-25 MED ORDER — LIDOCAINE 2% (20 MG/ML) 5 ML SYRINGE
INTRAMUSCULAR | Status: AC
  Filled 2022-04-25: qty 5

## 2022-04-25 MED ORDER — DEXAMETHASONE SODIUM PHOSPHATE 10 MG/ML IJ SOLN
INTRAMUSCULAR | Status: AC
  Filled 2022-04-25: qty 1

## 2022-04-25 MED ORDER — PROMETHAZINE HCL 12.5 MG PO TABS
12.5000 mg | ORAL_TABLET | Freq: Four times a day (QID) | ORAL | 0 refills | Status: DC | PRN
  Filled 2022-04-25: qty 30, 8d supply, fill #0

## 2022-04-25 MED ORDER — OXYCODONE HCL 5 MG PO TABS
ORAL_TABLET | ORAL | Status: AC
  Filled 2022-04-25: qty 1

## 2022-04-25 MED ORDER — CEFAZOLIN SODIUM-DEXTROSE 2-4 GM/100ML-% IV SOLN
2.0000 g | INTRAVENOUS | Status: AC
  Administered 2022-04-25: 2 g via INTRAVENOUS
  Filled 2022-04-25: qty 100

## 2022-04-25 MED ORDER — ONDANSETRON HCL 4 MG/2ML IJ SOLN
INTRAMUSCULAR | Status: DC | PRN
  Administered 2022-04-25: 4 mg via INTRAVENOUS

## 2022-04-25 MED ORDER — SUCCINYLCHOLINE CHLORIDE 200 MG/10ML IV SOSY
PREFILLED_SYRINGE | INTRAVENOUS | Status: DC | PRN
  Administered 2022-04-25: 140 mg via INTRAVENOUS

## 2022-04-25 MED ORDER — CHLORHEXIDINE GLUCONATE 0.12 % MT SOLN
15.0000 mL | Freq: Once | OROMUCOSAL | Status: AC
  Administered 2022-04-25: 15 mL via OROMUCOSAL
  Filled 2022-04-25: qty 15

## 2022-04-25 MED ORDER — ACETAMINOPHEN 500 MG PO TABS: 1000.0000 mg | ORAL_TABLET | Freq: Once | ORAL | Status: DC

## 2022-04-25 MED ORDER — FENTANYL CITRATE (PF) 100 MCG/2ML IJ SOLN
INTRAMUSCULAR | Status: AC
  Filled 2022-04-25: qty 2

## 2022-04-25 MED ORDER — PROPOFOL 10 MG/ML IV BOLUS
INTRAVENOUS | Status: AC
  Filled 2022-04-25: qty 20

## 2022-04-25 MED ORDER — FENTANYL CITRATE (PF) 100 MCG/2ML IJ SOLN
25.0000 ug | INTRAMUSCULAR | Status: DC | PRN
  Administered 2022-04-25: 25 ug via INTRAVENOUS
  Administered 2022-04-25: 50 ug via INTRAVENOUS
  Administered 2022-04-25: 25 ug via INTRAVENOUS

## 2022-04-25 MED ORDER — OXYCODONE HCL 5 MG/5ML PO SOLN: 5.0000 mg | Freq: Once | ORAL | Status: AC | PRN

## 2022-04-25 MED ORDER — ONDANSETRON HCL 4 MG/2ML IJ SOLN
INTRAMUSCULAR | Status: AC
  Filled 2022-04-25: qty 2

## 2022-04-25 MED ORDER — ONDANSETRON HCL 4 MG/2ML IJ SOLN: 4.0000 mg | Freq: Once | INTRAMUSCULAR | Status: DC | PRN

## 2022-04-25 MED ORDER — DEXAMETHASONE SODIUM PHOSPHATE 10 MG/ML IJ SOLN
INTRAMUSCULAR | Status: DC | PRN
  Administered 2022-04-25: 10 mg via INTRAVENOUS

## 2022-04-25 MED ORDER — EPINEPHRINE PF 1 MG/ML IJ SOLN
INTRAMUSCULAR | Status: DC | PRN
  Administered 2022-04-25 (×7): 1 mg

## 2022-04-25 MED ORDER — SUCCINYLCHOLINE CHLORIDE 200 MG/10ML IV SOSY
PREFILLED_SYRINGE | INTRAVENOUS | Status: AC
  Filled 2022-04-25: qty 10

## 2022-04-25 MED ORDER — OXYCODONE HCL 5 MG PO TABS
5.0000 mg | ORAL_TABLET | Freq: Once | ORAL | Status: AC | PRN
  Administered 2022-04-25: 5 mg via ORAL

## 2022-04-25 MED ORDER — PROPOFOL 10 MG/ML IV BOLUS
INTRAVENOUS | Status: DC | PRN
  Administered 2022-04-25: 50 mg via INTRAVENOUS
  Administered 2022-04-25: 250 mg via INTRAVENOUS
  Administered 2022-04-25: 100 mg via INTRAVENOUS

## 2022-04-25 MED ORDER — FENTANYL CITRATE (PF) 250 MCG/5ML IJ SOLN
INTRAMUSCULAR | Status: AC
  Filled 2022-04-25: qty 5

## 2022-04-25 MED ORDER — MIDAZOLAM HCL 2 MG/2ML IJ SOLN
INTRAMUSCULAR | Status: AC
  Filled 2022-04-25: qty 2

## 2022-04-25 MED ORDER — ORAL CARE MOUTH RINSE: 15.0000 mL | Freq: Once | OROMUCOSAL | Status: AC

## 2022-04-25 MED ORDER — LACTATED RINGERS IV SOLN: INTRAVENOUS | Status: DC

## 2022-04-25 MED ORDER — 0.9 % SODIUM CHLORIDE (POUR BTL) OPTIME
TOPICAL | Status: DC | PRN
  Administered 2022-04-25: 1000 mL

## 2022-04-25 MED ORDER — ROPIVACAINE HCL 5 MG/ML IJ SOLN
INTRAMUSCULAR | Status: DC | PRN
  Administered 2022-04-25: 30 mL via PERINEURAL

## 2022-04-25 MED ORDER — MIDAZOLAM HCL 2 MG/2ML IJ SOLN
INTRAMUSCULAR | Status: DC | PRN
  Administered 2022-04-25: 2 mg via INTRAVENOUS

## 2022-04-25 MED ORDER — SODIUM CHLORIDE 0.9 % IR SOLN
Status: DC | PRN
  Administered 2022-04-25 (×7): 3000 mL

## 2022-04-25 MED ORDER — OXYCODONE HCL 5 MG PO TABS
5.0000 mg | ORAL_TABLET | Freq: Four times a day (QID) | ORAL | 0 refills | Status: DC | PRN
  Filled 2022-04-25: qty 30, 8d supply, fill #0

## 2022-04-25 MED ORDER — FENTANYL CITRATE (PF) 250 MCG/5ML IJ SOLN
INTRAMUSCULAR | Status: DC | PRN
  Administered 2022-04-25 (×4): 50 ug via INTRAVENOUS

## 2022-04-25 SURGICAL SUPPLY — 56 items
BAG COUNTER SPONGE SURGICOUNT (BAG) ×1 IMPLANT
BANDAGE ESMARK 6X9 LF (GAUZE/BANDAGES/DRESSINGS) IMPLANT
BLADE CLIPPER SURG (BLADE) IMPLANT
BLADE EXCALIBUR 4.0X13 (MISCELLANEOUS) IMPLANT
BLADE SHAVER BONE 5.0X13 (MISCELLANEOUS) IMPLANT
BNDG COHESIVE 4X5 TAN STRL LF (GAUZE/BANDAGES/DRESSINGS) ×1 IMPLANT
BNDG ELASTIC 6X10 VLCR STRL LF (GAUZE/BANDAGES/DRESSINGS) IMPLANT
BNDG ESMARK 6X9 LF (GAUZE/BANDAGES/DRESSINGS)
CHLORAPREP W/TINT 26 (MISCELLANEOUS) ×1 IMPLANT
COVER MAYO STAND STRL (DRAPES) ×1 IMPLANT
COVER SURGICAL LIGHT HANDLE (MISCELLANEOUS) ×1 IMPLANT
CUFF TOURN SGL QUICK 34 (TOURNIQUET CUFF)
CUFF TRNQT CYL 34X4.125X (TOURNIQUET CUFF) IMPLANT
CUTTER BONE 4.0MM X 13CM (MISCELLANEOUS) ×1 IMPLANT
DERMABOND ADVANCED .7 DNX12 (GAUZE/BANDAGES/DRESSINGS) ×1 IMPLANT
DISSECTOR 4.0MMX13CM CVD (MISCELLANEOUS) ×1 IMPLANT
DRAPE EXTREMITY T 121X128X90 (DISPOSABLE) ×1 IMPLANT
DRAPE HALF SHEET 40X57 (DRAPES) ×1 IMPLANT
DRAPE INCISE IOBAN 66X45 STRL (DRAPES) ×1 IMPLANT
DRAPE OEC MINIVIEW 54X84 (DRAPES) ×1 IMPLANT
DRAPE U-SHAPE 47X51 STRL (DRAPES) ×1 IMPLANT
DW OUTFLOW CASSETTE/TUBE SET (MISCELLANEOUS) ×1 IMPLANT
GAUZE SPONGE 4X4 12PLY STRL (GAUZE/BANDAGES/DRESSINGS) IMPLANT
GLOVE BIOGEL PI IND STRL 8 (GLOVE) ×1 IMPLANT
GLOVE ORTHO TXT STRL SZ7.5 (GLOVE) ×2 IMPLANT
GOWN STRL REUS W/ TWL LRG LVL3 (GOWN DISPOSABLE) ×2 IMPLANT
GOWN STRL REUS W/ TWL XL LVL3 (GOWN DISPOSABLE) ×1 IMPLANT
GOWN STRL REUS W/TWL LRG LVL3 (GOWN DISPOSABLE) ×2
GOWN STRL REUS W/TWL XL LVL3 (GOWN DISPOSABLE) ×1
KIT BASIN OR (CUSTOM PROCEDURE TRAY) ×1 IMPLANT
KIT TURNOVER KIT B (KITS) ×1 IMPLANT
MANIFOLD NEPTUNE II (INSTRUMENTS) IMPLANT
NDL 18GX1X1/2 (RX/OR ONLY) (NEEDLE) IMPLANT
NDL HYPO 25GX1X1/2 BEV (NEEDLE) ×1 IMPLANT
NEEDLE 18GX1X1/2 (RX/OR ONLY) (NEEDLE) IMPLANT
NEEDLE HYPO 22GX1.5 SAFETY (NEEDLE) IMPLANT
NEEDLE HYPO 25GX1X1/2 BEV (NEEDLE) ×1 IMPLANT
NS IRRIG 1000ML POUR BTL (IV SOLUTION) ×1 IMPLANT
PACK ARTHROSCOPY DSU (CUSTOM PROCEDURE TRAY) ×1 IMPLANT
PAD ARMBOARD 7.5X6 YLW CONV (MISCELLANEOUS) ×2 IMPLANT
PADDING CAST ABS COTTON 6X4 NS (CAST SUPPLIES) IMPLANT
PADDING CAST COTTON 6X4 STRL (CAST SUPPLIES) ×1 IMPLANT
PORT APPOLLO RF 90DEGREE MULTI (SURGICAL WAND) ×1 IMPLANT
RESECTOR TORPEDO 4MM 13CM CVD (MISCELLANEOUS) IMPLANT
STRIP CLOSURE SKIN 1/2X4 (GAUZE/BANDAGES/DRESSINGS) ×1 IMPLANT
SUT ETHILON 3 0 PS 1 (SUTURE) ×1 IMPLANT
SUT MON AB 2-0 CT1 36 (SUTURE) IMPLANT
SYR 10ML LL (SYRINGE) IMPLANT
SYR 20ML ECCENTRIC (SYRINGE) IMPLANT
SYR CONTROL 10ML LL (SYRINGE) IMPLANT
TOWEL GREEN STERILE (TOWEL DISPOSABLE) ×1 IMPLANT
TOWEL GREEN STERILE FF (TOWEL DISPOSABLE) ×1 IMPLANT
TUBE CONNECTING 12X1/4 (SUCTIONS) ×1 IMPLANT
TUBING ARTHRO INFLOW-ONLY STRL (TUBING) IMPLANT
TUBING ARTHROSCOPY IRRIG 16FT (MISCELLANEOUS) ×1 IMPLANT
WATER STERILE IRR 1000ML POUR (IV SOLUTION) IMPLANT

## 2022-04-25 NOTE — Transfer of Care (Signed)
Immediate Anesthesia Transfer of Care Note  Patient: Jacob House  Procedure(s) Performed: RIGHT KNEE MANIPULATION, ARTHROSCOPIC LYSIS OF ADHESIONS (Right: Knee)  Patient Location: PACU  Anesthesia Type:GA combined with regional for post-op pain  Level of Consciousness: drowsy, patient cooperative and responds to stimulation  Airway & Oxygen Therapy: Patient Spontanous Breathing and Patient connected to face mask oxygen  Post-op Assessment: Report given to RN and Post -op Vital signs reviewed and stable  Post vital signs: Reviewed and stable  Last Vitals:  Vitals Value Taken Time  BP 127/61 04/25/22 0945  Temp    Pulse 92 04/25/22 0945  Resp    SpO2 100 % 04/25/22 0945  Vitals shown include unvalidated device data.  Last Pain:  Vitals:   04/25/22 0618  TempSrc:   PainSc: 0-No pain         Complications: No notable events documented.

## 2022-04-25 NOTE — Anesthesia Procedure Notes (Signed)
Anesthesia Regional Block: Adductor canal block   Pre-Anesthetic Checklist: , timeout performed,  Correct Patient, Correct Site, Correct Laterality,  Correct Procedure, Correct Position, site marked,  Risks and benefits discussed,  Surgical consent,  Pre-op evaluation,  At surgeon's request and post-op pain management  Laterality: Right  Prep: chloraprep       Needles:  Injection technique: Single-shot  Needle Type: Echogenic Stimulator Needle     Needle Length: 10cm  Needle Gauge: 21   Needle insertion depth: 8 cm   Additional Needles:   Procedures:,,,, ultrasound used (permanent image in chart),,    Narrative:  Start time: 04/25/2022 7:20 AM End time: 04/25/2022 7:25 AM Injection made incrementally with aspirations every 5 mL.  Performed by: Personally  Anesthesiologist: Josephine Igo, MD  Additional Notes: Timeout performed. Patient sedated. Relevant anatomy ID'd using [place]. Incremental 2-74m injection of LA with frequent aspiration. Patient tolerated procedure well.     Right Adductor Canal Block

## 2022-04-25 NOTE — H&P (Signed)
PREOPERATIVE H&P  HPI: Jacob House is a 56 y.o. 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  The various methods of treatment have been discussed with the patient and family.  After consideration of risks, benefits, and other options for treatment, the patient has consented to RIGHT KNEE MANIPULATION, ARTHROSCOPIC LYSIS OF ADHESIONS, POSSIBLE REVISION OF [place] as a surgical intervention.  The patient's history has been reviewed, patient examined, no change in status, stable for surgery.  I have reviewed the patient's chart and labs.    Once again in the preoperative area, we had a long discussion about where we are today and possible options today.  I reiterated that the #1 goal is to achieve a near normal range of motion which I plan to accomplish through arthroscopic lysis of adhesions and manipulation under anesthesia.  We discussed how he would like to proceed if a retear of the medial meniscus is encountered, e.g. as a result of the manipulation/lysis of adhesions.  I explained that typically this degree of stiffness does not occur when patients are in physical therapy and following the protocol immediately after surgery.  Recall that he did have a very delayed time getting into physical therapy, some 4 to 5 weeks after his initial surgery.  He does have therapy scheduled tomorrow as well as an appointment with me tomorrow, and 4 visits of therapy the following week.  He states that he would like to proceed with revision repair if a retear is encountered because he was having so much trouble with it before the index surgery, and he does not want to go through that again.  He would also like to try to restore function of his knee and preserve meniscal function going forward.  He understands this means he would need to be nonweightbearing in the brace.  Questions were answered to the patient's satisfaction.     PMH: Past Medical History:  Diagnosis Date   Allergy    Arthritis    Carpal tunnel syndrome    Family history of colon cancer 06/05/2021   Family history of Lynch syndrome 06/05/2021   GERD (gastroesophageal reflux disease)    Lynch syndrome    OSA on CPAP 12/08/2013   Snoring     Home Medications Allergies  No current facility-administered medications on file prior to encounter.   Current Outpatient Medications on File Prior to Encounter  Medication Sig Dispense Refill   cephALEXin (KEFLEX) 500 MG capsule Take 500 mg by mouth 4 (four) times daily.     furosemide (LASIX) 40 MG tablet Take 40 mg by mouth daily.     mupirocin ointment (BACTROBAN) 2 % Apply 1 Application topically 3 (three) times daily.     NON FORMULARY Pt uses a cpap nightly     promethazine (PHENERGAN) 12.5 MG tablet Take 1 tablet (12.5 mg total) by mouth every 6 (six) hours as needed for nausea or vomiting. 30 tablet 0   fluticasone (FLONASE) 50 MCG/ACT nasal spray Place 2 sprays into both nostrils daily as needed for allergies or rhinitis.     oxyCODONE (ROXICODONE) 5 MG immediate release tablet Take 1 tablet (5 mg total) by mouth every 6 (six) hours as needed for severe pain or breakthrough pain (POSTOPERATIVE PAIN). (Patient not taking: Reported on 04/12/2022) 30 tablet 0   No Known Allergies   PSH: Past Surgical History:  Procedure Laterality Date   ANAL FISSURE REPAIR  july 2009 and yrs ago   x 2    CHOLECYSTECTOMY  1999   COLONOSCOPY WITH PROPOFOL N/A 07/22/2013   Procedure: COLONOSCOPY WITH PROPOFOL;  Surgeon: Cleotis Nipper, MD;  Location: [facility];  Service: Endoscopy;  Laterality: N/A;   ESOPHAGOGASTRODUODENOSCOPY (EGD) WITH PROPOFOL N/A 07/22/2013   Procedure: ESOPHAGOGASTRODUODENOSCOPY (EGD) WITH PROPOFOL;  Surgeon: Cleotis Nipper, MD;  Location: [facility];  Service: Endoscopy;  Laterality: N/A;   EYE SURGERY Bilateral 2005   lasix   FRACTURE SURGERY Right    foot   GALLBLADDER  SURGERY     KNEE ARTHROSCOPY WITH MENISCAL REPAIR Right 01/15/2022   Procedure: RIGHT KNEE MEDIAL MENISCUS ROOT REPAIR;  Surgeon: Georgeanna Harrison, MD;  Location: [facility];  Service: Orthopedics;  Laterality: Right;   SHOULDER SURGERY Left sept 2014   cartlidge repair     Family History Social History  Family History  Problem Relation Age of Onset   Colon cancer Mother 28   Testicular cancer Father        d. 13   Colon cancer Maternal Uncle        d. 41   Colon cancer Maternal Uncle        dx 15s; Lynch syndrome   Heart disease Maternal Uncle    Colon cancer Maternal Grandmother        dx before 25   Breast cancer Maternal Grandmother        dx 52s   Colon cancer Maternal Grandfather        dx before 71    Social History   Socioeconomic History   Marital status: Significant Other    Spouse name: Juliann Pulse   Number of children: 0   Years of education: 16   Highest education level: Not on file  Occupational History   Occupation: Glass blower/designer    Employer: [employer]  Tobacco Use   Smoking status: Never   Smokeless tobacco: Never  Vaping Use   Vaping Use: Never used  Substance and Sexual Activity   Alcohol use: Yes    Alcohol/week: 0.0 - 1.0 standard drinks of alcohol    Comment: rare   Drug use: No   Sexual activity: Yes    Partners: Female    Birth control/protection: Condom    Comment: 1 sexual partner in last year  Other Topics Concern   Not on file  Social History Narrative   Lives with his significant other, Juliann Pulse, and their cats.   Social Determinants of Health   Financial Resource Strain: Not on file  Food Insecurity: Not on file  Transportation Needs: Not on file  Physical Activity: Not on file  Stress: Not on file  Social Connections: Not on file     Review of Systems: MSK: As noted per HPI above GI: No current Nausea/vomiting ENT: Denies sore throat, epistaxis CV: Denies chest pain Resp: No current shortness of breath  Other than mentioned  above, there are no Constitutional, Neurological, Psychiatric, ENT, Ophthalmological, Cardiovascular, Respiratory, GI, GU, Musculoskeletal, Integumentary, Lymphatic, Endocrine or Allergic issues.   Physical Examination: CV: Normal distal pulses Lungs: Unlabored respirations RLE: Examination is largely unchanged from his most recent visit in the office.  Range of motion approximately 10 degrees to 40 degrees.  Interval decreased effusion.  Intact dorsiflexion, plantarflexion, and EHL.  Sensation tact light touch distally in the superficial peroneal, deep peroneal, and tibial distributions.  Normal DP pulse.  Warm and well-perfused distally.  Assessment/Plan: RIGHT KNEE MANIPULATION, ARTHROSCOPIC LYSIS  OF ADHESIONS, POSSIBLE REVISION OF MEDIAL MENISCUS ROOT REPAIR    Georgeanna Harrison M.D. Orthopaedic Surgery [facility]  Review of this patient's medications prescribed by other providers does not in any way constitute an endorsement by this clinician of their use, indications, dosage, route, efficacy, interactions, or other clinical parameters.  Portions of the record have been created with voice recognition software.  Grammatical and punctuation errors, random word insertions, wrong-word or "sound-a-like" substitutions, pronoun errors (inaccuracies and/or substitutions), and/or incomplete sentences may have occurred due to the inherent limitations of voice recognition software.  Not all errors are caught or corrected.  Although every attempt is made to root out erroneous and incomplete transcription, the note may still not fully represent the intent or opinion of the author.  Read the chart carefully and recognize, using context, where errors/substitutions have occurred.  Any questions or concerns about the content of this note or information contained within the body of this dictation should be addressed directly with the author for clarification.

## 2022-04-25 NOTE — Discharge Instructions (Signed)
Discharge instructions for Dr. Jeryn Cerney, M.D.: Please refer to the two-sided discharge instructions paper that Dr. Ariannah Arenson placed in the patient's paper chart. Please give this to the patient to take home after reviewing with the patient!!   General discharge instructions:  PLEASE REFER TO TWO-SIDED PAPER INSTRUCTIONS IN PAPER CHART FOR SPECIFIC INSTRUCTIONS!!!  Diet: As you were doing prior to hospitalization. Shower:  Unless otherwise specified (i.e. on two-sided paper instructions with paper chart) may shower but keep the wounds dry, use an occlusive plastic wrap, NO SOAKING IN TUB.  If the bandage gets wet, change with a clean dry gauze. Dressing:  Unless otherwise specified (i.e. on two-sided paper instructions with paper chart), may change your dressing 3-5 days after surgery.  Then change the dressing daily with sterile gauze dressing.  If there are sticky tapes (steri-strips) on your wounds and all the stitches are absorbable.  Leave the steri-strips in place when changing your dressings, they will peel off with time, usually 2-3 weeks. Activity:  Increase activity slowly as tolerated, but follow the restrictions on the two-sided paper discharge instructions sheet that Dr. Karnisha Lefebre placed in the paper chart.  No lifting or driving for 6 weeks. Weight Bearing: WEIGHTBEARING AS TOLERATED (WBAT) on the RIGHT LOWER EXTREMITY. To prevent constipation: You may use over-the-counter stool softener(s) such as Colace (over the counter) 100 mg by mouth twice a day and/or Miralax (over the counter) for constipation as needed.  Drink plenty of fluids (prune juice may be helpful) and high fiber foods.  Itching:  If you experience itching with your medications, try taking only a single pain pill, or even half a pain pill at a time.  You can also use benadryl over the counter for itching or also to help with sleep.  Precautions:  If you experience chest pain or shortness of breath - call 911 immediately for  transfer to the hospital emergency department!!  PLEASE REFER TO TWO-SIDED PAPER INSTRUCTIONS IN PAPER CHART FOR SPECIFIC INSTRUCTIONS!!!  If you develop a fever greater that 101.1 deg F, purulent drainage from wound, increased redness or drainage from wound, or calf pain -- Call the office at [phone].  

## 2022-04-25 NOTE — Op Note (Signed)
OPERATIVE NOTE  Jacob House male 56 y.o. 04/25/2022  PREOPERATIVE DIAGNOSIS: Right knee arthrofibrosis s/p medial meniscus root repair Right knee stiffness and loss of motion s/p medial meniscus root repair  POSTOPERATIVE DIAGNOSIS: Right knee arthrofibrosis s/p medial meniscus root repair (M24.661) Right knee stiffness and loss of motion s/p medial meniscus root repair (M25.661)  PROCEDURE(S): Right knee arthroscopy with lysis of adhesions, including diagnostic evaluation (66063) Right knee manipulation under anesthesia (01601)  SURGEON: Georgeanna Harrison, M.D.  ASSISTANT(S): Izola Price, RNFA  ANESTHESIA: Choice  FINDINGS: Preoperative Examination: RLE: Examination is largely unchanged from his most recent visit in the office.  Range of motion approximately 10 degrees to 40 degrees.  Interval decreased effusion.  Intact dorsiflexion, plantarflexion, and EHL.  Sensation tact light touch distally in the superficial peroneal, deep peroneal, and tibial distributions.  Normal DP pulse.  Warm and well-perfused distally.  Operative Findings: Extensive organized arthrofibrosis with extensive organized adhesions and scar tissue throughout the knee, particularly involving the anterior interval and suprapatellar pouch.  This required extensive debridement and releasing.  After complete anterior interval release and a thorough debridement including suprapatellar pouch and medial and lateral gutters, there was full release of adhesive ankylosis structures.  Diagnostic arthroscopy confirmed intact medial meniscus root repair which had healed.  Manipulation restored full range of motion with full extension and nearly full flexion to 135 degrees.  Arthroscopic examination of the knee revealed the following: Medial compartment: Previous medial meniscus root repair intact and healed and stable to probing.  Remainder of medial meniscus intact and stable to probing.  Unchanged from previous arthroscopy,  grade 1 changes present on medial tibial plateau cartilage, with focal changes present on the medial femoral condyle cartilage.   Intercondylar notch: Extensive fibrotic band extending from anterior interval scarring to the top of the notch, thoroughly debrided and released.  ACL intact.  PCL intact. Lateral compartment: Recurrence of complex tear fraying of the central portion of the body of the lateral meniscus.  Remainder of lateral meniscus intact.  Unchanged grade 1 changes present on the lateral femoral condyle with grade 1 focal areas of grade 2 cartilage change present on the lateral tibial plateau. Lateral gutter: Synovitis, fibrosis, successfully debrided and released.  No loose bodies. Patellofemoral compartment: Unchanged grade 4 changes on the lateral patella facet with grade 3 and 4 changes of the medial patellar facet.  Grade 2-3 change in central portion of the trochlea, also unchanged. Suprapatellar pouch: Extensive fibrosis, fibrotic bands, and organized scarring.  Thorough debridement resulting in restoration of suprapatellar pouch and patellar mobility.  No loose bodies. Medial gutter: Extensive synovitis, fibrosis, and adhesions.  Thoroughly released and debrided.  No loose bodies.  IMPLANTS: * No implants in log *  INDICATIONS:  The patient is a 56 y.o. male who previously underwent right arthroscopic medial meniscus root repair.  Unfortunately he was significantly delayed in getting into physical therapy after that surgery, and he was never able to recover his motion.  Ultimately at his most recent clinic visit, he was noted to have lost even more motion, with range of motion 10 degrees to about 40 degrees.  Given that this is simply not an acceptable outcome and substantially less than functional, he was recommended to undergo repeat arthroscopy with diagnostic evaluation including evaluation of the previous repair, lysis of adhesions, and manipulation under anesthesia in order to  restore the motion, with possibility of revision meniscal repair if indicated.  He understood the risks, benefits and alternatives to surgery  which include but are not limited to bleeding, wound healing complications, infection, damage to surrounding structures, persistent pain, stiffness, lack of improvement, potential for subsequent arthritis or worsening of pre-existing arthritis, and need for further surgery, as well as complications related to anesthesia, cardiovascular complications, and death.  He also understood the potential for continued pain, and that there were no guarantees of acceptable outcome.  After weighing these risks the patient opted to proceed with surgery.  TECHNIQUE: Patient was identified in the preoperative holding area.  The right knee was confirmed as the appropriate operative site and marked by me.  Consent was signed by myself and the patient and witnessed by the preoperative nurse.  Adductor cannal and popliteal blocks were performed by anesthesia in the preoperative holding area.  Patient was taken to the operative suite and placed supine on the operative table.  Anesthesia was induced by the anesthesia team.  The patient was positioned appropriately for the procedure and all bony prominences were well padded.  A non-sterile thigh tourniquet was placed on the operative extremity.  Preoperative antibiotics were given. The extremity was prepped and draped in the usual sterile fashion and surgical timeout was performed.  Tourniquet was not used during the procedure.  Surface anatomy was marked out anteriorly.  The previous portal sites were also marked out.  Procedure was performed through the same previous portal sites.  Anterolateral infrapatellar portal was established.  The anteromedial infrapatellar portal was also established.  There was extensive anterior and old scarring and fibrosis which prevented any formal diagnostic arthroscopy and had to be dealt with immediately.   Throughout, lysis of lesions complex with a combination of arthroscopic sucker shaver and cautery.  The anterior interval was thoroughly debrided and released.  There was a fibrotic band extending from the anterior interval scar tissue up to the top of the notch which was released.  Completion of the anterior interval release and debridement enabled access to the suprapatellar pouch.  The knee was extended and instrumentation was advanced suprapatellar pouch, where thorough debridement was also performed, removing extensive fibrotic bands and adhesions.  A combination of this with the anterior interval release did successfully restore full extension.  Fibrotic bands on the medial and lateral sides patella were also released which restored patellar mobility.  Scarring in both the medial and lateral gutters was thoroughly debrided and released.  All of this had to be performed before and diagnostic arthroscopy could be performed.  Diagnostic arthroscopy was performed with findings as noted.  In particular, the previous medial meniscus root repair was intact and stable to probing.  This did not require revision.  The scope was withdrawn, and a manipulation under anesthesia was performed.  First the patella was mobilized both medially and laterally.  Downward pressure was applied centrally restoring full terminal extension.  The knee was then slowly flexed maximally with constant pressure anteriorly on the tibia, taking care to avoid iatrogenic injury to the extensor mechanism.  There is resulted in restoration of flexion up to at least 135 degrees.  Repeat diagnostic arthroscopy was performed following manipulation, confirming that the medial meniscus root repair remained intact and stable.  The knee was then thoroughly lavaged and suctioned of all fluid and debris.  Portal sites were closed with simple inverted #2-0 Monocryl sutures and sealed with Steri-Strips.  Dry sterile dressing was placed consisting of 4 x  4's, ABD, Webril, and Ace wrap.  He was placed in hinged knee brace locked in full extension.  An Iceman ice pack was placed over top of the brace.  Patient was awakened from anesthesia and transferred to PACU in stable condition.  He tolerated the procedure well.  No complications were noted intraoperatively.  POSTOPERATIVE INSTRUCTIONS: Mobility: Use crutches or walker as needed for ambulation but discontinue as soon as possible.  When resting in bed, keep hinged knee brace on and locked in full extension in order to maintain extension.  Otherwise remove brace including while seated and focus on trying to bend the knee is much as possible. Pain control: Continue to wean/titrate to appropriate oral regimen DVT Prophylaxis: 81 mg aspirin twice daily for 2 weeks RLE: Weightbearing as tolerated, full range of motion without restrictions Dressing care: Remove brace (if present), Ace wrap (if present), and gauze/tape dressing 48 hours after surgery.  Do not remove paper tapes (Steri-Strips) or any suture material.  Steri-Strips may eventually fall off on their own.  Reapply fresh gauze/tape.  Reapply Ace wrap (if present), wrapping lightly over wounds.  Reapply brace if present. Disposition: Home Follow-up: Please call [facility] [phone]) to schedule follow-op appointment for 2 weeks after surgery.  TOURNIQUET TIME: N/A  BLOOD LOSS: 30 mL         DRAINS: none         SPECIMEN: none       COMPLICATIONS:  * No complications entered in OR log *         DISPOSITION: PACU - hemodynamically stable.         CONDITION: stable   Georgeanna Harrison M.D. Orthopaedic Surgery [facility]   Portions of the record have been created with voice recognition software.  Grammatical and punctuation errors, random word insertions, wrong-word or "sound-a-like" substitutions, pronoun errors (inaccuracies and/or substitutions), and/or incomplete  sentences may have occurred due to the inherent limitations of voice recognition software.  Not all errors are caught or corrected.  Although every attempt is made to root out erroneous and incomplete transcription, the note may still not fully represent the intent or opinion of the author.  Read the chart carefully and recognize, using context, where errors/substitutions have occurred.  Any questions or concerns about the content of this note or information contained within the body of this dictation should be addressed directly with the author for clarification.

## 2022-04-25 NOTE — Anesthesia Procedure Notes (Addendum)
Procedure Name: Intubation Date/Time: 04/25/2022 7:49 AM  Performed by: Michele Rockers, CRNAPre-anesthesia Checklist: Patient identified, Patient being monitored, Timeout performed, Emergency Drugs available and Suction available Patient Re-evaluated:Patient Re-evaluated prior to induction Oxygen Delivery Method: Circle System Utilized Preoxygenation: Pre-oxygenation with 100% oxygen Induction Type: IV induction Ventilation: Mask ventilation without difficulty Laryngoscope Size: Mac, 4 and Glidescope Grade View: Grade I Tube type: Oral Tube size: 7.5 mm Number of attempts: 1 Airway Equipment and Method: Stylet Placement Confirmation: ETT inserted through vocal cords under direct vision, positive ETCO2 and breath sounds checked- equal and bilateral Secured at: 22 cm Tube secured with: Tape Dental Injury: Teeth and Oropharynx as per pre-operative assessment

## 2022-04-25 NOTE — Addendum Note (Signed)
Addendum  created 04/25/22 1254 by Michele Rockers, CRNA   Flowsheet accepted

## 2022-04-25 NOTE — Anesthesia Procedure Notes (Addendum)
   Anesthesia Regional Block: Popliteal block   Pre-Anesthetic Checklist: , timeout performed,  Correct Patient, Correct Site, Correct Laterality,  Correct Procedure, Correct Position, site marked,  Risks and benefits discussed,  Surgical consent,  Pre-op evaluation,  At surgeon's request and post-op pain management  Laterality: Right  Prep: chloraprep       Needles:  Injection technique: Single-shot  Needle Type: Echogenic Stimulator Needle     Needle Length: 10cm  Needle Gauge: 21   Needle insertion depth: 7 cm   Additional Needles:   Procedures:,,,, ultrasound used (permanent image in chart),,    Narrative:  Start time: 04/25/2022 7:15 AM End time: 04/25/2022 7:20 AM Injection made incrementally with aspirations every 5 mL.  Performed by: Personally  Anesthesiologist: Josephine Igo, MD  Additional Notes: Timeout performed. Patient sedated. Relevant anatomy ID'd using [place]. Incremental 2-40m injection of LA with frequent aspiration. Patient tolerated procedure well.

## 2022-04-25 NOTE — Anesthesia Postprocedure Evaluation (Signed)
Anesthesia Post Note  Patient: Jacob House  Procedure(s) Performed: RIGHT KNEE MANIPULATION, ARTHROSCOPIC LYSIS OF ADHESIONS (Right: Knee)     Patient location during evaluation: PACU Anesthesia Type: General Level of consciousness: awake and alert and oriented Pain management: pain level controlled Vital Signs Assessment: post-procedure vital signs reviewed and stable Respiratory status: spontaneous breathing, nonlabored ventilation and respiratory function stable Cardiovascular status: blood pressure returned to baseline and stable Postop Assessment: no apparent nausea or vomiting Anesthetic complications: no   No notable events documented.  Last Vitals:  Vitals:   04/25/22 1030 04/25/22 1045  BP: (!) 114/59 109/66  Pulse: 82 80  Resp: 19 15  Temp:  36.7 C  SpO2: 97% 96%    Last Pain:  Vitals:   04/25/22 1045  TempSrc:   PainSc: 2                  Nani Ingram A.

## 2022-04-26 ENCOUNTER — Encounter (HOSPITAL_COMMUNITY): Payer: Self-pay | Admitting: Orthopedic Surgery

## 2022-05-16 DIAGNOSIS — M25661 Stiffness of right knee, not elsewhere classified: Secondary | ICD-10-CM | POA: Diagnosis not present

## 2022-05-20 DIAGNOSIS — M25661 Stiffness of right knee, not elsewhere classified: Secondary | ICD-10-CM | POA: Diagnosis not present

## 2022-05-21 DIAGNOSIS — M25661 Stiffness of right knee, not elsewhere classified: Secondary | ICD-10-CM | POA: Diagnosis not present

## 2022-05-23 DIAGNOSIS — M25661 Stiffness of right knee, not elsewhere classified: Secondary | ICD-10-CM | POA: Diagnosis not present

## 2022-06-11 ENCOUNTER — Other Ambulatory Visit: Payer: Self-pay

## 2022-06-11 DIAGNOSIS — R202 Paresthesia of skin: Secondary | ICD-10-CM

## 2022-06-13 ENCOUNTER — Ambulatory Visit: Payer: 59 | Admitting: Neurology

## 2022-06-13 DIAGNOSIS — G5721 Lesion of femoral nerve, right lower limb: Secondary | ICD-10-CM

## 2022-06-13 DIAGNOSIS — R202 Paresthesia of skin: Secondary | ICD-10-CM | POA: Diagnosis not present

## 2022-06-13 NOTE — Procedures (Signed)
  [facility]  [place], [place]  [place], [place] [address] Tel: [phone] Fax: [phone] Test Date:  06/13/2022  Patient: Jacob House DOB: 12-28-1965 Physician: Narda Amber, DO  Sex: Male Height: '5\' 7"'$  Ref Phys: Georgeanna Harrison, MD  ID#: [id number]   Technician:    History: This is a 56 year old man with right knee meniscus repair referred for evaluation of right leg weakness.  NCV & EMG Findings: Extensive electrodiagnostic testing of the right lower shows:  Right sural and superficial peroneal sensory responses are within normal limits. Right peroneal and tibial motor responses are within normal limits. Right tibial H reflex study is within normal limits. Despite maximal activation, no motor unit activation is seen in the rectus femoris, vastus medialis, and iliopsoas muscles.  There is no evidence of accompanying active denervation.  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   ___________________________ Narda Amber, DO    Nerve Conduction Studies   Stim Site NR Peak (ms) Norm Peak (ms) O-P Amp (V) Norm O-P Amp  Right Sup Peroneal Anti Sensory (Ant Lat Mall)  34 C  12 cm    2.3 <4.6 7.8 >4  Right Sural Anti Sensory (Lat Mall)  34 C  Calf    3.0 <4.6 13.6 >4     Stim Site NR Onset (ms) Norm Onset (ms) O-P Amp (mV) Norm O-P Amp Site1 Site2 Delta-0 (ms) Dist (cm) Vel (m/s) Norm Vel (m/s)  Right Peroneal Motor (Ext Dig Brev)  34 C  Ankle    2.7 <6.0 2.9 >2.5 B Fib Ankle 6.5 37.0 57 >40  B Fib    9.2  2.4  Poplt B Fib 1.7 9.0 53 >40  Poplt    10.9  2.3         Right Tibial Motor (Abd Hall Brev)  34 C  Ankle    4.8 <6.0 10.7 >4 Knee Ankle 7.2 40.0 56 >40  Knee    12.0  7.6          Electromyography   Side Muscle Ins.Act Fibs Fasc Recrt Amp Dur Poly Activation Comment  Right AntTibialis Nml Nml Nml Nml Nml Nml Nml Nml N/A  Right Gastroc Nml Nml Nml  Nml Nml Nml Nml Nml N/A  Right Flex Dig Long Nml Nml Nml Nml Nml Nml Nml Nml N/A  Right RectFemoris Nml Nml Nml *None *- *- *- Nml N/A  Right BicepsFemS Nml Nml Nml Nml Nml Nml Nml Nml N/A  Right GluteusMed Nml Nml Nml Nml Nml Nml Nml Nml N/A  Right Iliopsoas Nml Nml Nml *None *- *- *- Nml N/A  Right VastusMed Nml Nml Nml *None *- *- *- Nml N/A  Right AdductorLong Nml Nml Nml Nml Nml Nml Nml Nml N/A      Waveforms:

## 2022-07-22 DIAGNOSIS — G4733 Obstructive sleep apnea (adult) (pediatric): Secondary | ICD-10-CM | POA: Diagnosis not present

## 2022-08-15 DIAGNOSIS — G5721 Lesion of femoral nerve, right lower limb: Secondary | ICD-10-CM | POA: Diagnosis not present

## 2022-08-22 DIAGNOSIS — M25561 Pain in right knee: Secondary | ICD-10-CM | POA: Diagnosis not present

## 2022-08-28 DIAGNOSIS — E042 Nontoxic multinodular goiter: Secondary | ICD-10-CM | POA: Diagnosis not present

## 2022-08-28 DIAGNOSIS — Z6841 Body Mass Index (BMI) 40.0 and over, adult: Secondary | ICD-10-CM | POA: Diagnosis not present

## 2022-08-28 DIAGNOSIS — E7849 Other hyperlipidemia: Secondary | ICD-10-CM | POA: Diagnosis not present

## 2022-08-28 DIAGNOSIS — Z23 Encounter for immunization: Secondary | ICD-10-CM | POA: Diagnosis not present

## 2022-08-28 DIAGNOSIS — E782 Mixed hyperlipidemia: Secondary | ICD-10-CM | POA: Diagnosis not present

## 2022-08-28 DIAGNOSIS — G4730 Sleep apnea, unspecified: Secondary | ICD-10-CM | POA: Diagnosis not present

## 2022-08-28 DIAGNOSIS — Z9889 Other specified postprocedural states: Secondary | ICD-10-CM | POA: Diagnosis not present

## 2022-09-24 DIAGNOSIS — M1611 Unilateral primary osteoarthritis, right hip: Secondary | ICD-10-CM | POA: Diagnosis not present

## 2022-09-24 DIAGNOSIS — G5721 Lesion of femoral nerve, right lower limb: Secondary | ICD-10-CM | POA: Diagnosis not present

## 2022-10-03 DIAGNOSIS — R29898 Other symptoms and signs involving the musculoskeletal system: Secondary | ICD-10-CM | POA: Diagnosis not present

## 2022-10-14 DIAGNOSIS — G5721 Lesion of femoral nerve, right lower limb: Secondary | ICD-10-CM | POA: Diagnosis not present

## 2022-10-14 DIAGNOSIS — R94131 Abnormal electromyogram [EMG]: Secondary | ICD-10-CM | POA: Diagnosis not present

## 2022-10-17 DIAGNOSIS — R7309 Other abnormal glucose: Secondary | ICD-10-CM | POA: Diagnosis not present

## 2022-10-17 DIAGNOSIS — Z86010 Personal history of colonic polyps: Secondary | ICD-10-CM | POA: Diagnosis not present

## 2022-10-17 DIAGNOSIS — E782 Mixed hyperlipidemia: Secondary | ICD-10-CM | POA: Diagnosis not present

## 2022-10-17 DIAGNOSIS — Z6841 Body Mass Index (BMI) 40.0 and over, adult: Secondary | ICD-10-CM | POA: Diagnosis not present

## 2022-10-17 DIAGNOSIS — E042 Nontoxic multinodular goiter: Secondary | ICD-10-CM | POA: Diagnosis not present

## 2022-10-21 DIAGNOSIS — G4733 Obstructive sleep apnea (adult) (pediatric): Secondary | ICD-10-CM | POA: Diagnosis not present

## 2022-11-14 DIAGNOSIS — M25561 Pain in right knee: Secondary | ICD-10-CM | POA: Diagnosis not present

## 2022-11-22 DIAGNOSIS — M25561 Pain in right knee: Secondary | ICD-10-CM | POA: Diagnosis not present

## 2022-12-12 DIAGNOSIS — M25561 Pain in right knee: Secondary | ICD-10-CM | POA: Diagnosis not present

## 2022-12-12 DIAGNOSIS — M25661 Stiffness of right knee, not elsewhere classified: Secondary | ICD-10-CM | POA: Diagnosis not present

## 2022-12-26 ENCOUNTER — Encounter: Payer: Self-pay | Admitting: Neurology

## 2022-12-26 ENCOUNTER — Ambulatory Visit: Payer: 59 | Admitting: Neurology

## 2022-12-26 ENCOUNTER — Encounter: Payer: Self-pay | Admitting: *Deleted

## 2022-12-26 VITALS — BP 134/72 | HR 71 | Ht 67.0 in | Wt 254.4 lb

## 2022-12-26 DIAGNOSIS — M25561 Pain in right knee: Secondary | ICD-10-CM

## 2022-12-26 DIAGNOSIS — G4733 Obstructive sleep apnea (adult) (pediatric): Secondary | ICD-10-CM

## 2022-12-26 DIAGNOSIS — G8929 Other chronic pain: Secondary | ICD-10-CM

## 2022-12-26 NOTE — Progress Notes (Signed)
Subjective:    Patient ID: Jacob House is a 57 y.o. male.  HPI    Interim history:   Mr. Braam is a 57 year old right-handed gentleman with an underlying medical history of carpal tunnel syndrome, shoulder pain, right knee pain, and obesity, who presents for followup consultation of his obstructive sleep apnea, treated with CPAP. He is unaccompanied today and presents for his yearly check up. I last saw him on 12/25/2021, at which time he was compliant with his CPAP. He was scheduled for right knee surgery.    Today, 12/26/2022: I reviewed his CPAP compliance data for the past 30 days, he was 100% compliant, residual AHI at goal at 2.0/h, leak on the higher side with the 95th percentile at 31.8 L/min on a pressure of 15 cm with EPR of 2.  We also reviewed his compliance data for the year and were able to issue a support letter so he can continue to take flying lessons.  Unfortunately, he has not been able to fly lately, he had right knee surgery in June 2023 but needed intraoperative manipulation in September 2023.  He still has pain and limitation in movements, he started seeing Dr. Merlyn Albert.  He has seen neurology through [facility], Dr. Stacy Gardner.  He had EMG and nerve conduction velocity testing through [facility]  Prior to that he had EMG and nerve conduction velocity testing through [facility] neurology with Dr. Allena Katz.  He is supposed to start another round of physical therapy.   The patient's allergies, current medications, family history, past medical history, past social history, past surgical history and problem list were reviewed and updated as appropriate.    Previously (copied from previous notes for reference):    I saw him on 12/25/20, at which time he was fully compliant with his CPAP and doing well.  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  He started a new job in January 2022.  He was supposed to start working in [place] in October 2022. He had gained about 20 pounds.    I  reviewed his CPAP compliance data from 11/24/2021 through 12/23/2021, which is a total of 30 days, during which time he used his machine every night with percent use days greater than 4 hours at 100%, indicating superb compliance with an average usage of 8 hours and 11 minutes, residual AHI at goal at 1.7/h, leak on the higher side with the 95th percentile at 32 mL/min on a pressure of 15 cm with EPR of 2.       I saw him on 12/29/19, at which time he was fully compliant with his CPAP. He started working with [employer] in September 2020.  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 He was then fully vaccinated for Covid.   I reviewed his CPAP compliance data from 11/24/2020 through 12/23/2020, which is a total of 30 days, during which time he used his machine every night with percent use days greater than 4 hours at 97%, indicating excellent compliance with an average usage of 7 hours and 9 minutes, residual AHI at goal at 1.9/h, leak acceptable with some increase noted at times, 95th percentile of leak at 26.8 L/min on a pressure of 15 cm with EPR of 2.  I also reviewed his compliance data for the past calendar year, he was fully compliant with treatment.       I saw him on 12/21/2018  in a virtual visit, at which time he had started using a new CPAP machine and he was compliant with treatment.  He was advised to follow-up routinely in 1 year.   I reviewed his CPAP compliance data from the past year, during which time he was fully compliant.  I also reviewed his most recent compliance data from 11/29/2019, through 12/28/2019, which is a total of 30 days, during which time he used his machine every night with percent use days greater than 4 hours at 100%, indicating superb compliance with an average usage of 6 hours and 56 minutes, residual AHI at goal at 2.8/h, leak on the high side consistently with a 95th percentile at 38.6 L/min on a pressure of  15 cm with EPR of 2.       I saw him on 07/16/2018, at which time he was compliant with his CPAP and presented for a visit to qualify for his new CPAP machine.  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 He did notice some odd noises coming from his CPAP machine and it was over 46 years old.   I reviewed his CPAP compliance data from 11/17/2018 through 12/16/2018 which is a total of 30 days, during which time he used his machine every night with percent used days greater than 4 hours at 100%, indicating superb compliance with an average usage of 7 hours and 1 minute, residual AHI at goal at 1.9/h, leak on the high side consistently with a 95th percentile at 43.6 L/min on a pressure of 15 cm with EPR of 2. I also reviewed his compliance data from 08/09/2017 through 08/08/2018 which is a total of 1 year, during which time his percent used days greater than 4 hours is 98.1% altogether with an average AHI at 2.5/h, pressure at 15 cm so altogether he has been compliant without any lapses of treatment for the past year which is exemplary. Set up date on the new machine was 08/07/2018.    I saw him on 12/17/2017, at which time he was fully compliant with his previous CPAP machine. He was advised to follow-up routinely in one year.    He called in the interim reporting issues and abnormal sounds with his CPAP machine and he was eligible for a new CPAP machine.    I reviewed his CPAP compliance data from 06/16/2018 through 07/15/2018 which is a total of 30 days, during which time he used his CPAP machine every night with percent used days greater than 4 hours at 100%, indicating superb compliance with an average usage of 6 hours and 55 minutes, residual AHI at goal at 2.5 per hour, pressure at 15, leak on the lower end.    I saw him on 01/21/2017, at which time he was fully compliant with his CPAP. I gave him a prescription for travel CPAP machine. He reported doing well,  he was working on his Careers information officer and working on weight loss. I suggested a one-year checkup.   He requested a replacement prescription for his travel CPAP in the interim, which we provided, but he could not fill it until he has a face-to-face visit as I understand.   I reviewed his CPAP compliance data from the past 30 days from 11/17/2017 through 12/16/2017, during which time he used his CPAP every night with percent used days greater than 4 hours at 100%, indicating superb compliance with an average usage of 6 hours and 50  minutes, residual AHI at goal at 2.6 per hour, leak fluctuates, some days better than others, average leakage is acceptable. We also reviewed his compliance data for the last year, he missed 1 day and has been fully compliant, apnea score at goal and leak on the low side.    I saw him on 11/28/2015, at which time he was doing well, he was fully compliant with his CPAP. I received his compliance data after the visit via fax from his DME company and he was under percent compliant at the time, he was trying to lose weight. 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   I reviewed his CPAP compliance data from 12/22/2016 through 01/20/2017 which is a total of 30 days, during which time he used his machine every night with percent used days greater than 4 hours at 100%, average usage of 6 hours and 48 minutes, residual AHI 2.4 per hour, leak low generally speaking, pressure of 15 cm.    I saw him on 11/17/2014 at which time he reported full compliance with CPAP. I reviewed his pulse ox report which indicated that he was appropriately treated with CPAP only and did not need supplemental oxygen thankfully. He changed from a nasal mask to nasal pillows interface in February 2016 and like to it. He had some issues with low back pain.    I reviewed his CPAP compliance data from 10/29/15 to 11/27/15, which is a total of 30 days,  during which time He used his machine 29 days with percent used days greater than 4 hours at 96.7%, indicating excellent compliance with an average usage of 6 hours and 16 minutes, residual AHI 3.4 per hour, pressure at 15 cm, leak low.    Addendum 11/17/2014: Later in the afternoon I received his CPAP compliance data from his DME company via fax. This was from 08/24/2014 through 09/22/2014 which is a total of 30 days during which time he used his machine every night with percent used days greater than 4 hours at 100%, indicating superb compliance with an average usage of 6 hours and 35 minutes. Residual AHI acceptable at 3.9 per hour on a pressure of 15 cm and leak low. This indicates superb compliance and of course supports his verbal report of full compliance.    I saw him on 05/09/2014, at which time he reported compliance with treatment. He had some residual daytime somnolence and some issues with mask fitting. He was using a [place] on nasal mask. He was still not always fully rested in the mornings. He was given a prescription for Voltaren by his orthopedic doctor for his arthritis. He was trying to lose weight and had in fact lost some weight. I asked him to meet with his DME provider for mask refit. I also suggested we proceed with an overnight pulse oximetry test to ensure that his oxygen saturations were fine on treatment.   I reviewed the patient's overnight pulse oximetry report from 09/23/2014 while on CPAP therapy at night. His average oxygen saturation was 95.2%, total test time was 6 hours and 11 minutes. Lowest oxygen saturation was 86%. Time below 88% saturation was 28 seconds. Based on these test results, it appears that the patient is appropriately treated on room air with CPAP only and does not require additional supplemental oxygen at this time. Average pulse rate was 60.2 bpm, maximum was 99 bpm, minimum was 46 bpm during the testing time.  I reviewed his compliance data from 04/10/14 to  05/08/14, which is 30 days, during which time he was 100%, percent used days greater than 4 hours was 90%, indicating excellent compliance. Residual AHI was 3.1 per hour. Leak was at times quite high.    I saw him on 12/08/2013, at which time I advised him regarding his sleep study results and his compliance. He indicated using his CPAP regularly. There was no data on his SD card. His [employer] CPAP machine indicated that he had been using it every day for the previous 30 days. He reported sleeping well with CPAP and having adjusted fairly well to it. His sleepiness was improved. I asked him to continue using CPAP regularly.   I reviewed the patient's CPAP compliance data from 11/09/2013 to 12/08/2013, which is a total of 30 days, during which time the patient used CPAP every day. The average usage for all days was 6 hours and 39 minutes. The percent used days greater than 4 hours was 100 %, indicating superb compliance. The residual AHI was 5.6 per hour, indicating an adequate treatment pressure of 15 cwp. Air leak from the mask was low.   I first met him on 08/31/2013, at which time he reported snoring and excessive daytime somnolence as well as witnessed apneas. I requested he return for sleep study. He had a split-night sleep study on 10/06/2013: Baseline sleep efficiency was significantly reduced at 45.5% with a long sleep latency of 116.5 minutes and wake after sleep onset of 34 minutes with mild sleep fragmentation noted. He had an elevated arousal index. He had markedly increased percentage of stage II sleep, near absence of slow-wave sleep, and absence of REM sleep prior to CPAP. He had no clinically significant periodic leg movements or EKG changes. He had moderate to loud snoring. His total AHI was 57.8 per hour. Baseline oxygen saturation was 91%, nadir was 67%. He was then titrated on CPAP with sleep efficiency improved at 73.3% with a latency to sleep of 50.5 minutes and wake after  sleep onset of 3.5 minutes with mild sleep fragmentation noted. His arousal index was normal. He had a increased percentage of stage II sleep, near absence of slow-wave sleep and an increased percentage of REM sleep at 34.4%. Average oxygen saturation was improved at 93%, nadir was 70% which was during the initial titration period. On the final pressure of 14 cm his O2 nadir was 91% but he did not achieve REM sleep on the final pressure. He was titrated from 4-14 cm with reduction of the AHI of 6.2 events per hour at the final pressure. Based on the test results I did start the patient on CPAP at a pressure of 15 cm as he had a residual AHI of over 5/h on the final pressure of 14 cm during the study.     I reviewed his compliance data from 10/26/2013 through 11/07/2013 which is a total of 13 days during which time he was CPAP every night. Percent used days greater than 4 hours was 100%, average usage was 7 hours and 17 minutes. Residual AHI was 8 per hour on a pressure of 15 cm with no significant leak.   His Past Medical History Is Significant For: Past Medical History:  Diagnosis Date   Allergy    Arthritis    Carpal tunnel syndrome    Family history of colon cancer 06/05/2021   Family history of Lynch syndrome 06/05/2021   GERD (gastroesophageal reflux disease)  Lynch syndrome    OSA on CPAP 12/08/2013   Snoring     His Past Surgical History Is Significant For: Past Surgical History:  Procedure Laterality Date   ANAL FISSURE REPAIR  july 2009 and yrs ago   x 2    CHOLECYSTECTOMY  1999   COLONOSCOPY WITH PROPOFOL N/A 07/22/2013   Procedure: COLONOSCOPY WITH PROPOFOL;  Surgeon: Florencia Reasons, MD;  Location: [facility];  Service: Endoscopy;  Laterality: N/A;   ESOPHAGOGASTRODUODENOSCOPY (EGD) WITH PROPOFOL N/A 07/22/2013   Procedure: ESOPHAGOGASTRODUODENOSCOPY (EGD) WITH PROPOFOL;  Surgeon: Florencia Reasons, MD;  Location: [facility];  Service: Endoscopy;  Laterality: N/A;   EYE  SURGERY Bilateral 2005   lasix   FRACTURE SURGERY Right    foot   GALLBLADDER SURGERY     KNEE ARTHROSCOPY WITH MENISCAL REPAIR Right 01/15/2022   Procedure: RIGHT KNEE MEDIAL MENISCUS ROOT REPAIR;  Surgeon: Ernestina Columbia, MD;  Location: [facility] OR;  Service: Orthopedics;  Laterality: Right;   KNEE ARTHROSCOPY WITH MENISCAL REPAIR Right 04/25/2022   Procedure: RIGHT KNEE MANIPULATION, ARTHROSCOPIC LYSIS OF ADHESIONS;  Surgeon: Ernestina Columbia, MD;  Location: [facility] OR;  Service: Orthopedics;  Laterality: Right;   SHOULDER SURGERY Left sept 2014   cartlidge repair    His Family History Is Significant For: Family History  Problem Relation Age of Onset   Colon cancer Mother 39   Testicular cancer Father        d. 7   Colon cancer Maternal Uncle        d. 50   Colon cancer Maternal Uncle        dx 38s; Lynch syndrome   Heart disease Maternal Uncle    Colon cancer Maternal Grandmother        dx before 93   Breast cancer Maternal Grandmother        dx 76s   Colon cancer Maternal Grandfather        dx before 50    His Social History Is Significant For: Social History   Socioeconomic History   Marital status: Significant Other    Spouse name: Olegario Messier   Number of children: 0   Years of education: 16   Highest education level: Not on file  Occupational History   Occupation: Location manager    Employer: [employer]  Tobacco Use   Smoking status: Never   Smokeless tobacco: Never  Vaping Use   Vaping Use: Never used  Substance and Sexual Activity   Alcohol use: Yes    Alcohol/week: 0.0 - 1.0 standard drinks of alcohol    Comment: rare   Drug use: No   Sexual activity: Yes    Partners: Female    Birth control/protection: Condom    Comment: 1 sexual partner in last year  Other Topics Concern   Not on file  Social History Narrative   Lives with his significant other, Olegario Messier, and their cats.   Social Determinants of Health   Financial Resource Strain: Not on file  Food  Insecurity: Not on file  Transportation Needs: Not on file  Physical Activity: Not on file  Stress: Not on file  Social Connections: Not on file    His Allergies Are:  No Known Allergies:   His Current Medications Are:  Outpatient Encounter Medications as of 12/26/2022  Medication Sig   fluticasone (FLONASE) 50 MCG/ACT nasal spray Place 2 sprays into both nostrils daily as needed for allergies or rhinitis.   NON FORMULARY Pt uses a cpap  nightly   [DISCONTINUED] cephALEXin (KEFLEX) 500 MG capsule Take 500 mg by mouth 4 (four) times daily.   [DISCONTINUED] furosemide (LASIX) 40 MG tablet Take 40 mg by mouth daily.   [DISCONTINUED] mupirocin ointment (BACTROBAN) 2 % Apply 1 Application topically 3 (three) times daily.   [DISCONTINUED] oxyCODONE (ROXICODONE) 5 MG immediate release tablet Take 1 tablet (5 mg total) by mouth every 6 (six) hours as needed for severe pain or breakthrough pain (POSTOPERATIVE PAIN).   [DISCONTINUED] promethazine (PHENERGAN) 12.5 MG tablet Take 1 tablet (12.5 mg total) by mouth every 6 (six) hours as needed for nausea or vomiting.   No facility-administered encounter medications on file as of 12/26/2022.  :  Review of Systems:  Out of a complete 14 point review of systems, all are reviewed and negative with the exception of these symptoms as listed below:  Review of Systems  Neurological:        Doing ok, no concerns,  ESS 7.      Objective:  Neurological Exam  Physical Exam Physical Examination:   Vitals:   12/26/22 1421  BP: 134/72  Pulse: 71    General Examination: The patient is a very pleasant 57 y.o. male in no acute distress. He appears well-developed and well-nourished and well-groomed.     HEENT: Extraocular tracking is well-preserved.  Hearing is grossly intact. Face is symmetric with normal facial animation and normal facial sensation. Speech is clear with no dysarthria noted. There is no hypophonia. There is no lip, neck/head, jaw or  voice tremor. Neck shows FROM.  No carotid bruits.  Oropharynx exam reveals: no obvious change, mild mouth dryness, tongue protrudes centrally and palate elevates symmetrically.    Chest: Clear to auscultation without wheezing, rhonchi or crackles noted.   Heart: S1+S2+0, regular and normal without murmurs, rubs or gallops noted.    Abdomen: Soft, non-tender and non-distended.   Extremities: There is puffiness noted in the distal lower extremities bilaterally, left more than right.        Skin: Warm and dry without trophic changes noted.    Musculoskeletal: exam reveals limited mobility in the right knee and he walks with a significant limp, no walking aid.  Unremarkable surgical scars.  Right knee wider than left.   Neurologically:  Mental status: The patient is awake, alert and oriented in all 4 spheres. His memory, attention, language and knowledge are appropriate. There is no aphasia, agnosia, apraxia or anomia. Speech is clear with normal prosody and enunciation. Thought process is linear. Mood is congruent and affect is normal.  Cranial nerves are as described above under HEENT exam.  Motor exam: Normal bulk, moving all 4 extremities with the exception of limitation of mobility in the right lower extremity.  No obvious muscle wasting, no obvious fasciculations.  There is no obvious action or resting tremor, no lower extremity tremor tremor. Fine motor skills are intact grossly.  Cerebellar testing shows no dysmetria or intention tremor. There is no truncal or gait ataxia.  Sensory exam is intact to light touch.    Assessment and Plan:    In summary, Yaseen Reifel is a 57 year old male with an underlying medical history of carpal tunnel syndrome, arthritis and obesity, who presents for followup consultation of his obstructive sleep apnea, well treated with CPAP of 15 cm with ongoing good benefit and full compliance.  We reviewed his compliance for the year, I also issued a letter of  support so he can continue to take flying left.  Unfortunately, he has had a significant physical setback after his knee surgery.  He is now seeing Dr. Despina Hick and is supposed to start physical therapy again.  He saw a neurologist through [facility] and had EMG and nerve conduction velocity testing in November 2023 through Dr. Allena Katz at [facility] neurolog[facility] and more recently through [facility].  He is motivated to continue with his CPAP.  He has had some weight gain due to reduced mobility.  He is commended for his CPAP treatment adherence.  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  He is advised to follow-up routinely to see one of our nurse practitioners in 1 month, we can offer a MyChart video visit as well if he prefers.  He may be eligible for a new machine by next year.  We can consider a home sleep test for reevaluation at the time.  For now, he can maintain treatment on his current machine which has not given him any trouble or shown an error message.  I answered all his questions today and he was in agreement with our plan.  I spent 30 minutes in total face-to-face time and in reviewing records during pre-charting, more than 50% of which was spent in counseling and coordination of care, reviewing test results, reviewing medications and treatment regimen and/or in discussing or reviewing the diagnosis of OSA, right knee pain, the prognosis and treatment options. Pertinent laboratory and imaging test results that were available during this visit with the patient were reviewed by me and considered in my medical decision making (see chart for details).

## 2022-12-26 NOTE — Patient Instructions (Signed)

## 2023-01-01 DIAGNOSIS — M25661 Stiffness of right knee, not elsewhere classified: Secondary | ICD-10-CM | POA: Diagnosis not present

## 2023-01-01 DIAGNOSIS — M6281 Muscle weakness (generalized): Secondary | ICD-10-CM | POA: Diagnosis not present

## 2023-01-07 DIAGNOSIS — M6281 Muscle weakness (generalized): Secondary | ICD-10-CM | POA: Diagnosis not present

## 2023-01-07 DIAGNOSIS — M25661 Stiffness of right knee, not elsewhere classified: Secondary | ICD-10-CM | POA: Diagnosis not present

## 2023-01-09 DIAGNOSIS — G4733 Obstructive sleep apnea (adult) (pediatric): Secondary | ICD-10-CM | POA: Diagnosis not present

## 2023-01-10 DIAGNOSIS — M25561 Pain in right knee: Secondary | ICD-10-CM | POA: Diagnosis not present

## 2023-01-10 DIAGNOSIS — M6281 Muscle weakness (generalized): Secondary | ICD-10-CM | POA: Diagnosis not present

## 2023-01-13 DIAGNOSIS — M25561 Pain in right knee: Secondary | ICD-10-CM | POA: Diagnosis not present

## 2023-01-13 DIAGNOSIS — M6281 Muscle weakness (generalized): Secondary | ICD-10-CM | POA: Diagnosis not present

## 2023-01-16 DIAGNOSIS — M6281 Muscle weakness (generalized): Secondary | ICD-10-CM | POA: Diagnosis not present

## 2023-01-16 DIAGNOSIS — M25561 Pain in right knee: Secondary | ICD-10-CM | POA: Diagnosis not present

## 2023-01-20 DIAGNOSIS — M25561 Pain in right knee: Secondary | ICD-10-CM | POA: Diagnosis not present

## 2023-01-20 DIAGNOSIS — M6281 Muscle weakness (generalized): Secondary | ICD-10-CM | POA: Diagnosis not present

## 2023-01-20 DIAGNOSIS — M25661 Stiffness of right knee, not elsewhere classified: Secondary | ICD-10-CM | POA: Diagnosis not present

## 2023-01-21 DIAGNOSIS — M6281 Muscle weakness (generalized): Secondary | ICD-10-CM | POA: Diagnosis not present

## 2023-01-21 DIAGNOSIS — M25561 Pain in right knee: Secondary | ICD-10-CM | POA: Diagnosis not present

## 2023-01-22 DIAGNOSIS — M6281 Muscle weakness (generalized): Secondary | ICD-10-CM | POA: Diagnosis not present

## 2023-01-22 DIAGNOSIS — M25661 Stiffness of right knee, not elsewhere classified: Secondary | ICD-10-CM | POA: Diagnosis not present

## 2023-01-22 DIAGNOSIS — M25561 Pain in right knee: Secondary | ICD-10-CM | POA: Diagnosis not present

## 2023-01-23 DIAGNOSIS — M25561 Pain in right knee: Secondary | ICD-10-CM | POA: Diagnosis not present

## 2023-01-27 DIAGNOSIS — M25561 Pain in right knee: Secondary | ICD-10-CM | POA: Diagnosis not present

## 2023-01-27 DIAGNOSIS — M6281 Muscle weakness (generalized): Secondary | ICD-10-CM | POA: Diagnosis not present

## 2023-01-27 DIAGNOSIS — M25661 Stiffness of right knee, not elsewhere classified: Secondary | ICD-10-CM | POA: Diagnosis not present

## 2023-02-04 DIAGNOSIS — M25561 Pain in right knee: Secondary | ICD-10-CM | POA: Diagnosis not present

## 2023-02-04 DIAGNOSIS — M6281 Muscle weakness (generalized): Secondary | ICD-10-CM | POA: Diagnosis not present

## 2023-02-04 DIAGNOSIS — M25661 Stiffness of right knee, not elsewhere classified: Secondary | ICD-10-CM | POA: Diagnosis not present

## 2023-02-10 DIAGNOSIS — M25561 Pain in right knee: Secondary | ICD-10-CM | POA: Diagnosis not present

## 2023-02-10 DIAGNOSIS — M25661 Stiffness of right knee, not elsewhere classified: Secondary | ICD-10-CM | POA: Diagnosis not present

## 2023-02-10 DIAGNOSIS — M6281 Muscle weakness (generalized): Secondary | ICD-10-CM | POA: Diagnosis not present

## 2023-02-14 DIAGNOSIS — M6281 Muscle weakness (generalized): Secondary | ICD-10-CM | POA: Diagnosis not present

## 2023-02-14 DIAGNOSIS — M25661 Stiffness of right knee, not elsewhere classified: Secondary | ICD-10-CM | POA: Diagnosis not present

## 2023-02-14 DIAGNOSIS — M25561 Pain in right knee: Secondary | ICD-10-CM | POA: Diagnosis not present

## 2023-02-17 DIAGNOSIS — M6281 Muscle weakness (generalized): Secondary | ICD-10-CM | POA: Diagnosis not present

## 2023-02-17 DIAGNOSIS — M25661 Stiffness of right knee, not elsewhere classified: Secondary | ICD-10-CM | POA: Diagnosis not present

## 2023-02-17 DIAGNOSIS — M25561 Pain in right knee: Secondary | ICD-10-CM | POA: Diagnosis not present

## 2023-02-20 DIAGNOSIS — M25561 Pain in right knee: Secondary | ICD-10-CM | POA: Diagnosis not present

## 2023-02-20 DIAGNOSIS — M25661 Stiffness of right knee, not elsewhere classified: Secondary | ICD-10-CM | POA: Diagnosis not present

## 2023-02-20 DIAGNOSIS — M6281 Muscle weakness (generalized): Secondary | ICD-10-CM | POA: Diagnosis not present

## 2023-02-24 DIAGNOSIS — M6281 Muscle weakness (generalized): Secondary | ICD-10-CM | POA: Diagnosis not present

## 2023-02-24 DIAGNOSIS — M25661 Stiffness of right knee, not elsewhere classified: Secondary | ICD-10-CM | POA: Diagnosis not present

## 2023-02-24 DIAGNOSIS — M25561 Pain in right knee: Secondary | ICD-10-CM | POA: Diagnosis not present

## 2023-02-28 DIAGNOSIS — M25561 Pain in right knee: Secondary | ICD-10-CM | POA: Diagnosis not present

## 2023-02-28 DIAGNOSIS — M6281 Muscle weakness (generalized): Secondary | ICD-10-CM | POA: Diagnosis not present

## 2023-02-28 DIAGNOSIS — M25661 Stiffness of right knee, not elsewhere classified: Secondary | ICD-10-CM | POA: Diagnosis not present

## 2023-03-03 DIAGNOSIS — M6281 Muscle weakness (generalized): Secondary | ICD-10-CM | POA: Diagnosis not present

## 2023-03-03 DIAGNOSIS — M25661 Stiffness of right knee, not elsewhere classified: Secondary | ICD-10-CM | POA: Diagnosis not present

## 2023-03-03 DIAGNOSIS — M25561 Pain in right knee: Secondary | ICD-10-CM | POA: Diagnosis not present

## 2023-03-06 DIAGNOSIS — M25561 Pain in right knee: Secondary | ICD-10-CM | POA: Diagnosis not present

## 2023-03-06 DIAGNOSIS — M6281 Muscle weakness (generalized): Secondary | ICD-10-CM | POA: Diagnosis not present

## 2023-03-06 DIAGNOSIS — M25661 Stiffness of right knee, not elsewhere classified: Secondary | ICD-10-CM | POA: Diagnosis not present

## 2023-03-11 DIAGNOSIS — M25561 Pain in right knee: Secondary | ICD-10-CM | POA: Diagnosis not present

## 2023-03-11 DIAGNOSIS — M25661 Stiffness of right knee, not elsewhere classified: Secondary | ICD-10-CM | POA: Diagnosis not present

## 2023-03-11 DIAGNOSIS — M6281 Muscle weakness (generalized): Secondary | ICD-10-CM | POA: Diagnosis not present

## 2023-03-13 DIAGNOSIS — M24661 Ankylosis, right knee: Secondary | ICD-10-CM | POA: Diagnosis not present

## 2023-03-13 DIAGNOSIS — M25561 Pain in right knee: Secondary | ICD-10-CM | POA: Diagnosis not present

## 2023-03-14 DIAGNOSIS — M25561 Pain in right knee: Secondary | ICD-10-CM | POA: Diagnosis not present

## 2023-03-14 DIAGNOSIS — M25661 Stiffness of right knee, not elsewhere classified: Secondary | ICD-10-CM | POA: Diagnosis not present

## 2023-03-14 DIAGNOSIS — M6281 Muscle weakness (generalized): Secondary | ICD-10-CM | POA: Diagnosis not present

## 2023-03-18 DIAGNOSIS — M25561 Pain in right knee: Secondary | ICD-10-CM | POA: Diagnosis not present

## 2023-03-18 DIAGNOSIS — M6281 Muscle weakness (generalized): Secondary | ICD-10-CM | POA: Diagnosis not present

## 2023-03-18 DIAGNOSIS — M25661 Stiffness of right knee, not elsewhere classified: Secondary | ICD-10-CM | POA: Diagnosis not present

## 2023-03-20 DIAGNOSIS — M6281 Muscle weakness (generalized): Secondary | ICD-10-CM | POA: Diagnosis not present

## 2023-03-20 DIAGNOSIS — M25561 Pain in right knee: Secondary | ICD-10-CM | POA: Diagnosis not present

## 2023-03-20 DIAGNOSIS — M25661 Stiffness of right knee, not elsewhere classified: Secondary | ICD-10-CM | POA: Diagnosis not present

## 2023-03-23 DIAGNOSIS — M6281 Muscle weakness (generalized): Secondary | ICD-10-CM | POA: Diagnosis not present

## 2023-03-23 DIAGNOSIS — M25561 Pain in right knee: Secondary | ICD-10-CM | POA: Diagnosis not present

## 2023-03-25 DIAGNOSIS — M25661 Stiffness of right knee, not elsewhere classified: Secondary | ICD-10-CM | POA: Diagnosis not present

## 2023-03-25 DIAGNOSIS — M6281 Muscle weakness (generalized): Secondary | ICD-10-CM | POA: Diagnosis not present

## 2023-03-25 DIAGNOSIS — M25561 Pain in right knee: Secondary | ICD-10-CM | POA: Diagnosis not present

## 2023-03-27 DIAGNOSIS — M25561 Pain in right knee: Secondary | ICD-10-CM | POA: Diagnosis not present

## 2023-03-27 DIAGNOSIS — M6281 Muscle weakness (generalized): Secondary | ICD-10-CM | POA: Diagnosis not present

## 2023-03-27 DIAGNOSIS — M25661 Stiffness of right knee, not elsewhere classified: Secondary | ICD-10-CM | POA: Diagnosis not present

## 2023-03-31 DIAGNOSIS — G4733 Obstructive sleep apnea (adult) (pediatric): Secondary | ICD-10-CM | POA: Diagnosis not present

## 2023-04-10 DIAGNOSIS — Z800 Family history of malignant neoplasm of digestive organs: Secondary | ICD-10-CM | POA: Diagnosis not present

## 2023-04-10 DIAGNOSIS — K449 Diaphragmatic hernia without obstruction or gangrene: Secondary | ICD-10-CM | POA: Diagnosis not present

## 2023-04-10 DIAGNOSIS — Z09 Encounter for follow-up examination after completed treatment for conditions other than malignant neoplasm: Secondary | ICD-10-CM | POA: Diagnosis not present

## 2023-04-10 DIAGNOSIS — Z1509 Genetic susceptibility to other malignant neoplasm: Secondary | ICD-10-CM | POA: Diagnosis not present

## 2023-04-10 DIAGNOSIS — Z86010 Personal history of colonic polyps: Secondary | ICD-10-CM | POA: Diagnosis not present

## 2023-05-26 DIAGNOSIS — Z6841 Body Mass Index (BMI) 40.0 and over, adult: Secondary | ICD-10-CM | POA: Diagnosis not present

## 2023-05-26 DIAGNOSIS — Z23 Encounter for immunization: Secondary | ICD-10-CM | POA: Diagnosis not present

## 2023-06-30 DIAGNOSIS — G4733 Obstructive sleep apnea (adult) (pediatric): Secondary | ICD-10-CM | POA: Diagnosis not present

## 2023-07-08 DIAGNOSIS — G4733 Obstructive sleep apnea (adult) (pediatric): Secondary | ICD-10-CM | POA: Diagnosis not present

## 2023-07-10 DIAGNOSIS — M24661 Ankylosis, right knee: Secondary | ICD-10-CM | POA: Diagnosis not present

## 2023-07-10 DIAGNOSIS — M25561 Pain in right knee: Secondary | ICD-10-CM | POA: Diagnosis not present

## 2023-07-21 DIAGNOSIS — M25561 Pain in right knee: Secondary | ICD-10-CM | POA: Diagnosis not present

## 2023-07-21 DIAGNOSIS — M25661 Stiffness of right knee, not elsewhere classified: Secondary | ICD-10-CM | POA: Diagnosis not present

## 2023-07-25 DIAGNOSIS — M25561 Pain in right knee: Secondary | ICD-10-CM | POA: Diagnosis not present

## 2023-07-25 DIAGNOSIS — M25661 Stiffness of right knee, not elsewhere classified: Secondary | ICD-10-CM | POA: Diagnosis not present

## 2023-07-31 DIAGNOSIS — M25661 Stiffness of right knee, not elsewhere classified: Secondary | ICD-10-CM | POA: Diagnosis not present

## 2023-07-31 DIAGNOSIS — M25561 Pain in right knee: Secondary | ICD-10-CM | POA: Diagnosis not present

## 2023-08-05 DIAGNOSIS — M25561 Pain in right knee: Secondary | ICD-10-CM | POA: Diagnosis not present

## 2023-08-05 DIAGNOSIS — M25661 Stiffness of right knee, not elsewhere classified: Secondary | ICD-10-CM | POA: Diagnosis not present

## 2023-08-08 DIAGNOSIS — M25661 Stiffness of right knee, not elsewhere classified: Secondary | ICD-10-CM | POA: Diagnosis not present

## 2023-08-08 DIAGNOSIS — M25561 Pain in right knee: Secondary | ICD-10-CM | POA: Diagnosis not present

## 2023-08-12 DIAGNOSIS — M25661 Stiffness of right knee, not elsewhere classified: Secondary | ICD-10-CM | POA: Diagnosis not present

## 2023-08-12 DIAGNOSIS — M25561 Pain in right knee: Secondary | ICD-10-CM | POA: Diagnosis not present

## 2023-08-14 DIAGNOSIS — M25661 Stiffness of right knee, not elsewhere classified: Secondary | ICD-10-CM | POA: Diagnosis not present

## 2023-08-14 DIAGNOSIS — M25561 Pain in right knee: Secondary | ICD-10-CM | POA: Diagnosis not present

## 2023-08-20 DIAGNOSIS — M25661 Stiffness of right knee, not elsewhere classified: Secondary | ICD-10-CM | POA: Diagnosis not present

## 2023-08-20 DIAGNOSIS — M25561 Pain in right knee: Secondary | ICD-10-CM | POA: Diagnosis not present

## 2023-08-22 DIAGNOSIS — M25561 Pain in right knee: Secondary | ICD-10-CM | POA: Diagnosis not present

## 2023-08-22 DIAGNOSIS — M25661 Stiffness of right knee, not elsewhere classified: Secondary | ICD-10-CM | POA: Diagnosis not present

## 2023-08-28 DIAGNOSIS — M25661 Stiffness of right knee, not elsewhere classified: Secondary | ICD-10-CM | POA: Diagnosis not present

## 2023-08-28 DIAGNOSIS — M25561 Pain in right knee: Secondary | ICD-10-CM | POA: Diagnosis not present

## 2023-09-01 DIAGNOSIS — M25661 Stiffness of right knee, not elsewhere classified: Secondary | ICD-10-CM | POA: Diagnosis not present

## 2023-09-01 DIAGNOSIS — M25561 Pain in right knee: Secondary | ICD-10-CM | POA: Diagnosis not present

## 2023-09-04 DIAGNOSIS — M25661 Stiffness of right knee, not elsewhere classified: Secondary | ICD-10-CM | POA: Diagnosis not present

## 2023-09-04 DIAGNOSIS — M25561 Pain in right knee: Secondary | ICD-10-CM | POA: Diagnosis not present

## 2023-09-08 DIAGNOSIS — M25661 Stiffness of right knee, not elsewhere classified: Secondary | ICD-10-CM | POA: Diagnosis not present

## 2023-09-08 DIAGNOSIS — M25561 Pain in right knee: Secondary | ICD-10-CM | POA: Diagnosis not present

## 2023-09-11 DIAGNOSIS — M25561 Pain in right knee: Secondary | ICD-10-CM | POA: Diagnosis not present

## 2023-09-11 DIAGNOSIS — M25661 Stiffness of right knee, not elsewhere classified: Secondary | ICD-10-CM | POA: Diagnosis not present

## 2023-11-07 DIAGNOSIS — S83241A Other tear of medial meniscus, current injury, right knee, initial encounter: Secondary | ICD-10-CM | POA: Diagnosis not present

## 2023-12-02 DIAGNOSIS — M23321 Other meniscus derangements, posterior horn of medial meniscus, right knee: Secondary | ICD-10-CM | POA: Diagnosis not present

## 2023-12-02 DIAGNOSIS — M1711 Unilateral primary osteoarthritis, right knee: Secondary | ICD-10-CM | POA: Diagnosis not present

## 2023-12-09 DIAGNOSIS — M25561 Pain in right knee: Secondary | ICD-10-CM | POA: Diagnosis not present

## 2023-12-09 DIAGNOSIS — M25661 Stiffness of right knee, not elsewhere classified: Secondary | ICD-10-CM | POA: Diagnosis not present

## 2023-12-12 DIAGNOSIS — M25661 Stiffness of right knee, not elsewhere classified: Secondary | ICD-10-CM | POA: Diagnosis not present

## 2023-12-12 DIAGNOSIS — M25561 Pain in right knee: Secondary | ICD-10-CM | POA: Diagnosis not present

## 2023-12-16 DIAGNOSIS — M25661 Stiffness of right knee, not elsewhere classified: Secondary | ICD-10-CM | POA: Diagnosis not present

## 2023-12-16 DIAGNOSIS — M25561 Pain in right knee: Secondary | ICD-10-CM | POA: Diagnosis not present

## 2023-12-19 DIAGNOSIS — M25661 Stiffness of right knee, not elsewhere classified: Secondary | ICD-10-CM | POA: Diagnosis not present

## 2023-12-19 DIAGNOSIS — M25561 Pain in right knee: Secondary | ICD-10-CM | POA: Diagnosis not present

## 2023-12-23 DIAGNOSIS — M25561 Pain in right knee: Secondary | ICD-10-CM | POA: Diagnosis not present

## 2023-12-23 DIAGNOSIS — M25661 Stiffness of right knee, not elsewhere classified: Secondary | ICD-10-CM | POA: Diagnosis not present

## 2023-12-26 DIAGNOSIS — M25561 Pain in right knee: Secondary | ICD-10-CM | POA: Diagnosis not present

## 2023-12-26 DIAGNOSIS — M25661 Stiffness of right knee, not elsewhere classified: Secondary | ICD-10-CM | POA: Diagnosis not present

## 2023-12-30 DIAGNOSIS — M25661 Stiffness of right knee, not elsewhere classified: Secondary | ICD-10-CM | POA: Diagnosis not present

## 2023-12-30 DIAGNOSIS — M25561 Pain in right knee: Secondary | ICD-10-CM | POA: Diagnosis not present

## 2023-12-31 NOTE — Progress Notes (Unsigned)
 [facility] [address] [place]. [place] [address] [phone]       OFFICE FOLLOW UP NOTE  Mr. Jacob House Date of Birth:  Aug 09, 1965 Medical Record Number:  [id number]    Primary neurologist: Dr. Omar Bibber Reason for visit: CPAP follow-up    SUBJECTIVE:   CHIEF COMPLAINT:  No chief complaint on file.   Follow-up visit:  Prior visit: 12/26/2022 with Dr. Omar Bibber  Brief HPI:   Jacob House is a 58 y.o. male who is followed for OSA on CPAP.  Initially diagnosed in 2015 after split-night study showing severe OSA with total AHI of 57.8/h and noted improvement of apneas under CPAP therapy with pressure 15.  CPAP therapy initiated 2015.  He received new CPAP machine in 08/2018.  At prior visit with Dr. Omar Bibber, noted excellent compliance and optimal residual AHI.     Interval history:        ROS:   14 system review of systems performed and negative with exception of those listed in HPI  PMH:  Past Medical History:  Diagnosis Date   Allergy    Arthritis    Carpal tunnel syndrome    Family history of colon cancer 06/05/2021   Family history of Lynch syndrome 06/05/2021   GERD (gastroesophageal reflux disease)    Lynch syndrome    OSA on CPAP 12/08/2013   Snoring     PSH:  Past Surgical History:  Procedure Laterality Date   ANAL FISSURE REPAIR  july 2009 and yrs ago   x 2    CHOLECYSTECTOMY  1999   COLONOSCOPY WITH PROPOFOL  N/A 07/22/2013   Procedure: COLONOSCOPY WITH PROPOFOL ;  Surgeon: Brice Campi, MD;  Location: [facility];  Service: Endoscopy;  Laterality: N/A;   ESOPHAGOGASTRODUODENOSCOPY (EGD) WITH PROPOFOL  N/A 07/22/2013   Procedure: ESOPHAGOGASTRODUODENOSCOPY (EGD) WITH PROPOFOL ;  Surgeon: Brice Campi, MD;  Location: [facility];  Service: Endoscopy;  Laterality: N/A;   EYE SURGERY Bilateral 2005   lasix   FRACTURE SURGERY Right    foot   GALLBLADDER SURGERY     KNEE ARTHROSCOPY WITH MENISCAL REPAIR Right  01/15/2022   Procedure: RIGHT KNEE MEDIAL MENISCUS ROOT REPAIR;  Surgeon: Bettyjane Brunet, MD;  Location: [facility];  Service: Orthopedics;  Laterality: Right;   KNEE ARTHROSCOPY WITH MENISCAL REPAIR Right 04/25/2022   Procedure: RIGHT KNEE MANIPULATION, ARTHROSCOPIC LYSIS OF ADHESIONS;  Surgeon: Bettyjane Brunet, MD;  Location: [facility];  Service: Orthopedics;  Laterality: Right;   SHOULDER SURGERY Left sept 2014   cartlidge repair    Social History:  Social History   Socioeconomic History   Marital status: Significant Other    Spouse name: Thersia Flax   Number of children: 0   Years of education: 16   Highest education level: Not on file  Occupational History   Occupation: Location manager    Employer: [employer]  Tobacco Use   Smoking status: Never   Smokeless tobacco: Never  Vaping Use   Vaping status: Never Used  Substance and Sexual Activity   Alcohol use: Yes    Alcohol/week: 0.0 - 1.0 standard drinks of alcohol    Comment: rare   Drug use: No   Sexual activity: Yes    Partners: Female    Birth control/protection: Condom    Comment: 1 sexual partner in last year  Other Topics Concern   Not on file  Social History Narrative   Lives with his significant other, Thersia Flax, and their cats.   Social Drivers of Health  Financial Resource Strain: Not on file  Food Insecurity: Not on file  Transportation Needs: Not on file  Physical Activity: Not on file  Stress: Not on file  Social Connections: Unknown (12/02/2021)   Received from [facility], [facility]   Social Network    Social Network: Not on file  Intimate Partner Violence: Unknown (11/08/2021)   Received from [facility], [facility]   HITS    Physically Hurt: Not on file    Insult or Talk Down To: Not on file    Threaten Physical Harm: Not on file    Scream or Curse: Not on file    Family History:  Family History  Problem Relation Age of Onset   Colon cancer Mother 36   Testicular cancer Father        d.  4   Colon cancer Maternal Uncle        d. 75   Colon cancer Maternal Uncle        dx 53s; Lynch syndrome   Heart disease Maternal Uncle    Colon cancer Maternal Grandmother        dx before 69   Breast cancer Maternal Grandmother        dx 53s   Colon cancer Maternal Grandfather        dx before 50    Medications:   Current Outpatient Medications on File Prior to Visit  Medication Sig Dispense Refill   fluticasone  (FLONASE ) 50 MCG/ACT nasal spray Place 2 sprays into both nostrils daily as needed for allergies or rhinitis.     NON FORMULARY Pt uses a cpap nightly     No current facility-administered medications on file prior to visit.    Allergies:  No Known Allergies    OBJECTIVE:  Physical Exam  There were no vitals filed for this visit. There is no height or weight on file to calculate BMI. No results found.   General: well developed, well nourished, seated, in no evident distress Head: head normocephalic and atraumatic.   Neck: supple with no carotid or supraclavicular bruits Cardiovascular: regular rate and rhythm, no murmurs Musculoskeletal: no deformity Skin:  no rash/petichiae Vascular:  Normal pulses all extremities   Neurologic Exam Mental Status: Awake and fully alert. Oriented to place and time. Recent and remote memory intact. Attention span, concentration and fund of knowledge appropriate. Mood and affect appropriate.  Cranial Nerves: Pupils equal, briskly reactive to light. Extraocular movements full without nystagmus. Visual fields full to confrontation. Hearing intact. Facial sensation intact. Face, tongue, palate moves normally and symmetrically.  Motor: Normal bulk and tone. Normal strength in all tested extremity muscles Gait and Station: Arises from chair without difficulty. Stance is normal. Gait demonstrates normal stride length and balance without use of AD. Tandem walk and heel toe without difficulty.         ASSESSMENT/PLAN: Jacob House is a 58 y.o. year old male    OSA on CPAP :  Compliance report shows satisfactory usage with optimal residual AHI.   Continue current pressure settings at 15.   *** Patient due for new machine, current machine received 08/2018.  Order will be placed to complete updated sleep study, HST vs in lab will be determined by insurance company Discussed continued nightly usage with ensuring greater than 4 hours nightly for optimal benefit and per insurance purposes.   Continue to follow with DME company for any needed supplies or CPAP related concerns     Follow up in ***  or call earlier if needed   CC:  PCP: Minus Amel, MD    I spent *** minutes of face-to-face and non-face-to-face time with patient.  This included previsit chart review, lab review, study review, order entry, electronic health record documentation, patient education and discussion regarding above diagnoses and treatment plan and answered all other questions to patient's satisfaction    Johny Nap, [facility]  [facility] [address] [place], [place] [address]  Phone [phone] Fax [phone] Note: This document was prepared with digital dictation and possible smart phrase technology. Any transcriptional errors that result from this process are unintentional.

## 2024-01-01 ENCOUNTER — Ambulatory Visit: Payer: 59 | Admitting: Adult Health

## 2024-01-01 ENCOUNTER — Encounter: Payer: Self-pay | Admitting: Adult Health

## 2024-01-01 VITALS — BP 132/74 | HR 62 | Ht 67.0 in | Wt 256.0 lb

## 2024-01-01 DIAGNOSIS — G4733 Obstructive sleep apnea (adult) (pediatric): Secondary | ICD-10-CM | POA: Diagnosis not present

## 2024-01-01 NOTE — Patient Instructions (Addendum)
 Your Plan:  You will be called to schedule a home sleep study and then can proceed with obtaining a new machine   Continue nightly use of CPAP with greater than 4 hours per night for optimal benefit and per insurance requirements  Please talk to your DME provider about getting replacement supplies on a regular basis. Please be sure to change your filter every month, your mask about every 3 months, hose about every 6 months, humidifier chamber about yearly. Some restrictions are imposed by your insurance carrier with regard to how frequently you can get certain supplies. Your DME company can provide further details if necessary.       Follow-up in 1 year or call earlier if needed      Thank you for coming to see us  at [facility]. I hope we have been able to provide you high quality care today.  You may receive a patient satisfaction survey over the next few weeks. We would appreciate your feedback and comments so that we may continue to improve ourselves and the health of our patients. \

## 2024-01-06 ENCOUNTER — Telehealth: Payer: Self-pay | Admitting: Adult Health

## 2024-01-06 DIAGNOSIS — M25561 Pain in right knee: Secondary | ICD-10-CM | POA: Diagnosis not present

## 2024-01-06 DIAGNOSIS — M25661 Stiffness of right knee, not elsewhere classified: Secondary | ICD-10-CM | POA: Diagnosis not present

## 2024-01-06 NOTE — Telephone Encounter (Signed)
 Spoke w/Kya at [facility] who stated Pt does not need a new sleep study for new machine since he has been compliant. Made Kya aware that our providers prefer a new study if it has been more than 5 years since a prior sleep study. Kya voiced understanding and stated she will hold off on ordering new machine until HST completed and requested a copy of the completed sleep study for [facility]'s records. Informed Kya a copy of completed HST will be sent per request. Kya stated thanks for call.

## 2024-01-06 NOTE — Telephone Encounter (Signed)
 There is no open order for a new machine. The only order on file is the order to continue current supplies for the patient. When the patient has a new sleep study completed and read we will then send a order for the new machine but in the meantime there is a pressure setting order and supplies order that should be addressed and not closed out on. Will send a message through community message.

## 2024-01-06 NOTE — Telephone Encounter (Signed)
 Kya from [facility] has called to inform RN she will have to close the order until sleep study is done, she would like a call back at [phone] option 1

## 2024-01-06 NOTE — Telephone Encounter (Signed)
 [facility]) Patient had sleep study done in 2020;Asking the  reason neurologist ordered a new sleep studies. If there is not a reason can go ahead and  process the order with the sleep study currently have. Would like a call back.

## 2024-01-08 DIAGNOSIS — M25661 Stiffness of right knee, not elsewhere classified: Secondary | ICD-10-CM | POA: Diagnosis not present

## 2024-01-08 DIAGNOSIS — M25561 Pain in right knee: Secondary | ICD-10-CM | POA: Diagnosis not present

## 2024-01-12 DIAGNOSIS — M25561 Pain in right knee: Secondary | ICD-10-CM | POA: Diagnosis not present

## 2024-01-12 DIAGNOSIS — M25661 Stiffness of right knee, not elsewhere classified: Secondary | ICD-10-CM | POA: Diagnosis not present

## 2024-01-16 DIAGNOSIS — M25661 Stiffness of right knee, not elsewhere classified: Secondary | ICD-10-CM | POA: Diagnosis not present

## 2024-01-16 DIAGNOSIS — M25561 Pain in right knee: Secondary | ICD-10-CM | POA: Diagnosis not present

## 2024-01-19 DIAGNOSIS — M25561 Pain in right knee: Secondary | ICD-10-CM | POA: Diagnosis not present

## 2024-01-19 DIAGNOSIS — M25661 Stiffness of right knee, not elsewhere classified: Secondary | ICD-10-CM | POA: Diagnosis not present

## 2024-01-19 NOTE — Telephone Encounter (Signed)
   Not sure why [facility] still claiming they have cancelled his order. I have replied to them advising pt had a SS in 2015 and he has had notes showing benefit ever since. He needs to have supplies in the meantime for his current machine. Waiting to hear back.

## 2024-01-21 DIAGNOSIS — M25661 Stiffness of right knee, not elsewhere classified: Secondary | ICD-10-CM | POA: Diagnosis not present

## 2024-01-21 DIAGNOSIS — M25561 Pain in right knee: Secondary | ICD-10-CM | POA: Diagnosis not present

## 2024-01-21 DIAGNOSIS — H2513 Age-related nuclear cataract, bilateral: Secondary | ICD-10-CM | POA: Diagnosis not present

## 2024-01-26 DIAGNOSIS — M25561 Pain in right knee: Secondary | ICD-10-CM | POA: Diagnosis not present

## 2024-01-26 DIAGNOSIS — M25661 Stiffness of right knee, not elsewhere classified: Secondary | ICD-10-CM | POA: Diagnosis not present

## 2024-01-28 DIAGNOSIS — M25661 Stiffness of right knee, not elsewhere classified: Secondary | ICD-10-CM | POA: Diagnosis not present

## 2024-01-28 DIAGNOSIS — M25561 Pain in right knee: Secondary | ICD-10-CM | POA: Diagnosis not present

## 2024-02-04 ENCOUNTER — Telehealth: Payer: Self-pay | Admitting: Neurology

## 2024-02-04 NOTE — Telephone Encounter (Signed)
 Patient called asking to see if Dr. Buck will prescribe Zepbound for his sleep apnea. Please call the patient to discuss./

## 2024-02-04 NOTE — Telephone Encounter (Signed)
 Spoke with patient and informed him that I spoke with Dr Buck and she is not prescribing Zepbound. He is welcome to speak with his pcp or weight loss provider about it. Patient verbalized understanding and appreciation for the call back.

## 2024-02-05 ENCOUNTER — Ambulatory Visit (INDEPENDENT_AMBULATORY_CARE_PROVIDER_SITE_OTHER): Admitting: Neurology

## 2024-02-05 DIAGNOSIS — G4734 Idiopathic sleep related nonobstructive alveolar hypoventilation: Secondary | ICD-10-CM

## 2024-02-05 DIAGNOSIS — G4733 Obstructive sleep apnea (adult) (pediatric): Secondary | ICD-10-CM

## 2024-02-10 DIAGNOSIS — H2511 Age-related nuclear cataract, right eye: Secondary | ICD-10-CM | POA: Diagnosis not present

## 2024-02-10 DIAGNOSIS — H25811 Combined forms of age-related cataract, right eye: Secondary | ICD-10-CM | POA: Diagnosis not present

## 2024-02-11 NOTE — Progress Notes (Signed)
 See procedure note.

## 2024-02-13 ENCOUNTER — Ambulatory Visit: Payer: Self-pay | Admitting: Adult Health

## 2024-02-13 DIAGNOSIS — G4733 Obstructive sleep apnea (adult) (pediatric): Secondary | ICD-10-CM

## 2024-02-13 NOTE — Procedures (Signed)
 [facility]  HOME SLEEP TEST (SANSA) REPORT (Mail-Out Device):   STUDY DATE: 02/06/2024  DOB: 08-18-1965  MRN: [id number]  ORDERING CLINICIAN: True Mar, MD, PhD   REFERRING CLINICIAN: Harlene Bogaert, NP  CLINICAL INFORMATION/HISTORY: 58 year old male with an underlying medical history of allergies, arthritis, carpal tunnel syndrome, reflux disease, and severe obesity with a BMI of over 40, who presents for reevaluation of his sleep apnea.  He has been compliant with his CPAP of 15 cm with EPR of 2 with good tolerance of treatment and good apnea control.  He should be eligible for new equipment.  PATIENT'S LAST REPORTED EPWORTH SLEEPINESS SCORE (ESS): 6/24.  BMI (at the time of sleep clinic visit and/or test date): 40.1 kg/m  FINDINGS:   Study Protocol:    The SANSA single-point-of-skin-contact chest-worn sensor - an FDA cleared and DOT approved type 4 home sleep test device - measures eight physiological channels,  including blood oxygen saturation (measured via PPG [photoplethysmography]), EKG-derived heart rate, respiratory effort, chest movement (measured via accelerometer), snoring, body position, and actigraphy. The device is designed to be worn for up to 10 hours per study.   Sleep Summary:   Total Recording Time (hours, min): 8 hours, 20 min  Total Effective Sleep Time (hours, min):  6 hours, 7 min  Sleep Efficiency (%):    74%   Respiratory Indices:   Calculated sAHI (per hour):  27.4/hour         Oxygen Saturation Statistics:    Oxygen Saturation (%) Mean: 94.3%   Minimum oxygen saturation (%):                 71.2%   O2 Saturation Range (%): 71.2-100%   Time below or at 88% saturation: 14 min   Pulse Rate Statistics:   Pulse Mean (bpm):    69/min    Pulse Range (53- 116/min)   Snoring: Mild to loud  IMPRESSION/DIAGNOSES:   OSA (obstructive sleep apnea), moderate Nocturnal Hypoxemia  RECOMMENDATIONS:   This home sleep  test demonstrates moderate obstructive sleep apnea with a total AHI of 27.4/hour and O2 nadir of 71.2% with time below or at 88% saturation of over 10 minutes for the night, indicating nocturnal hypoxemia.  Variable snoring was detected, ranging from mild to loud.  Ongoing treatment with a positive airway pressure (PAP) device is recommended. The patient has been compliant with his current CPAP machine with good tolerance of treatment and good apnea control.  He should be eligible for a new machine, I would recommend prescribing the new CPAP machine, keeping the settings the same, mask of choice, sized to fit.  A full night titration study may be considered to optimize treatment settings, monitor proper oxygen saturations and aid with improvement of tolerance and adherence, if needed down the road. Alternative treatment options may include a dental device through dentistry or orthodontics in selected patients or Inspire (hypoglossal nerve stimulator) in carefully selected patients (meeting inclusion criteria).  Concomitant weight loss is recommended (where clinically appropriate). Please note that untreated obstructive sleep apnea may carry additional perioperative morbidity. Patients with significant obstructive sleep apnea should receive perioperative PAP therapy and the surgeons and particularly the anesthesiologist should be informed of the diagnosis and the severity of the sleep disordered breathing. The patient should be cautioned not to drive, work at heights, or operate dangerous or heavy equipment when tired or sleepy. Review and reiteration of good sleep hygiene measures should be pursued with any patient. Other causes of  the patient's symptoms, including circadian rhythm disturbances, an underlying mood disorder, medication effect and/or an underlying medical problem cannot be ruled out based on this test. Clinical correlation is recommended.  The patient and his referring provider will be notified of  the test results. The patient will be seen in follow up in sleep clinic at [facility].  I certify that I have reviewed the raw data recording prior to the issuance of this report in accordance with the standards of the American Academy of Sleep Medicine (AASM).    INTERPRETING PHYSICIAN:   True Mar, MD, PhD Medical Director, [facility]) Diplomat, ABPN (Neurology and Sleep)   [facility] [address] [place], [place] [address] [phone]

## 2024-02-19 NOTE — Telephone Encounter (Signed)
 Ladaysha Soutar D, CMA  Coltrane, Terri; [place], [place]; [place], Morgan L New orders have been placed for the above pt, DOB:Apr 19, 2066 Thanks

## 2024-03-02 DIAGNOSIS — H25812 Combined forms of age-related cataract, left eye: Secondary | ICD-10-CM | POA: Diagnosis not present

## 2024-03-02 DIAGNOSIS — H2512 Age-related nuclear cataract, left eye: Secondary | ICD-10-CM | POA: Diagnosis not present

## 2024-03-06 ENCOUNTER — Ambulatory Visit
Admission: RE | Admit: 2024-03-06 | Discharge: 2024-03-06 | Disposition: A | Discharge status: Home / Self Care | Source: Ambulatory Visit | Attending: Family Medicine | Admitting: Family Medicine

## 2024-03-06 VITALS — BP 136/80 | HR 78 | Temp 98.0°F | Resp 18

## 2024-03-06 DIAGNOSIS — B9689 Other specified bacterial agents as the cause of diseases classified elsewhere: Secondary | ICD-10-CM

## 2024-03-06 DIAGNOSIS — J069 Acute upper respiratory infection, unspecified: Secondary | ICD-10-CM | POA: Diagnosis not present

## 2024-03-06 DIAGNOSIS — J309 Allergic rhinitis, unspecified: Secondary | ICD-10-CM

## 2024-03-06 MED ORDER — PSEUDOEPHEDRINE HCL 30 MG PO TABS: 30.0000 mg | ORAL_TABLET | Freq: Three times a day (TID) | ORAL | 0 refills | Status: AC | PRN

## 2024-03-06 MED ORDER — PROMETHAZINE-DM 6.25-15 MG/5ML PO SYRP: 5.0000 mL | ORAL_SOLUTION | Freq: Three times a day (TID) | ORAL | 0 refills | Status: AC | PRN

## 2024-03-06 MED ORDER — AMOXICILLIN 875 MG PO TABS: 875.0000 mg | ORAL_TABLET | Freq: Two times a day (BID) | ORAL | 0 refills | Status: AC

## 2024-03-06 MED ORDER — CETIRIZINE HCL 10 MG PO TABS: 10.0000 mg | ORAL_TABLET | Freq: Every day | ORAL | 0 refills | Status: AC

## 2024-03-06 NOTE — ED Provider Notes (Signed)
 [facility]  Note:  This document was prepared using Conservation officer, historic buildings and may include unintentional dictation errors.  MRN: [id number] DOB: 11-Mar-1966  Subjective:   Jacob House is a 58 y.o. male presenting for 1 week history of persistent and worsening throat pain, drainage, malaise, fatigue, fevers, coughing.  No chest pain, shortness of breath or wheezing.  No asthma.  Has allergies but are not difficult to control.  Does not have to take anything regularly for this.  No smoking of any kind including cigarettes, cigars, vaping, marijuana use.    No current facility-administered medications for this encounter.  Current Outpatient Medications:    aspirin-sod bicarb-citric acid (ALKA-SELTZER) 325 MG TBEF tablet, Take 325 mg by mouth every 6 (six) hours as needed., Disp: , Rfl:    ibuprofen  (ADVIL ) 200 MG tablet, Take 200 mg by mouth every 6 (six) hours as needed., Disp: , Rfl:    fluticasone  (FLONASE ) 50 MCG/ACT nasal spray, Place 2 sprays into both nostrils daily as needed for allergies or rhinitis., Disp: , Rfl:    NON FORMULARY, Pt uses a cpap nightly, Disp: , Rfl:    Allergies  Allergen Reactions   Amoxicillin -Pot Clavulanate Diarrhea and Nausea And Vomiting    Past Medical History:  Diagnosis Date   Allergy    Arthritis    Carpal tunnel syndrome    Family history of colon cancer 06/05/2021   Family history of Lynch syndrome 06/05/2021   GERD (gastroesophageal reflux disease)    Lynch syndrome    OSA on CPAP 12/08/2013   Snoring      Past Surgical History:  Procedure Laterality Date   ANAL FISSURE REPAIR  july 2009 and yrs ago   x 2    CHOLECYSTECTOMY  1999   COLONOSCOPY WITH PROPOFOL  N/A 07/22/2013   Procedure: COLONOSCOPY WITH PROPOFOL ;  Surgeon: Lamar LULLA Bunk, MD;  Location: [facility];  Service: Endoscopy;  Laterality: N/A;   ESOPHAGOGASTRODUODENOSCOPY (EGD) WITH PROPOFOL  N/A 07/22/2013   Procedure:  ESOPHAGOGASTRODUODENOSCOPY (EGD) WITH PROPOFOL ;  Surgeon: Lamar LULLA Bunk, MD;  Location: [facility];  Service: Endoscopy;  Laterality: N/A;   EYE SURGERY Bilateral 2005   lasix   FRACTURE SURGERY Right    foot   GALLBLADDER SURGERY     KNEE ARTHROSCOPY WITH MENISCAL REPAIR Right 01/15/2022   Procedure: RIGHT KNEE MEDIAL MENISCUS ROOT REPAIR;  Surgeon: Doll Skates, MD;  Location: [facility];  Service: Orthopedics;  Laterality: Right;   KNEE ARTHROSCOPY WITH MENISCAL REPAIR Right 04/25/2022   Procedure: RIGHT KNEE MANIPULATION, ARTHROSCOPIC LYSIS OF ADHESIONS;  Surgeon: Doll Skates, MD;  [facility];  Service: Orthopedics;  Laterality: Right;   SHOULDER SURGERY Left sept 2014   cartlidge repair    Family History  Problem Relation Age of Onset   Colon cancer Mother 68   Testicular cancer Father        d. 26   Colon cancer Maternal Uncle        d. 54   Colon cancer Maternal Uncle        dx 69s; Lynch syndrome   Heart disease Maternal Uncle    Colon cancer Maternal Grandmother        dx before 66   Breast cancer Maternal Grandmother        dx 13s   Colon cancer Maternal Grandfather        dx before 6    Social History   Tobacco Use   Smoking status:  Never   Smokeless tobacco: Never  Vaping Use   Vaping status: Never Used  Substance Use Topics   Alcohol use: Yes    Alcohol/week: 0.0 - 1.0 standard drinks of alcohol    Comment: rare   Drug use: No    ROS   Objective:   Vitals: BP 136/80 (BP Location: Left Arm)   Pulse 78   Temp 98 F (36.7 C) (Oral)   Resp 18   SpO2 96%   Physical Exam Constitutional:      General: He is not in acute distress.    Appearance: Normal appearance. He is well-developed and normal weight. He is not ill-appearing, toxic-appearing or diaphoretic.  HENT:     Head: Normocephalic and atraumatic.     Right Ear: Tympanic membrane, ear canal and external ear normal. No drainage, swelling or tenderness. No middle ear effusion. There  is no impacted cerumen. Tympanic membrane is not erythematous or bulging.     Left Ear: Tympanic membrane, ear canal and external ear normal. No drainage, swelling or tenderness.  No middle ear effusion. There is no impacted cerumen. Tympanic membrane is not erythematous or bulging.     Nose: Congestion present. No rhinorrhea.     Mouth/Throat:     Mouth: Mucous membranes are moist.     Pharynx: Posterior oropharyngeal erythema (with significant post-nasal drainge overlying pharynx) present. No oropharyngeal exudate.  Eyes:     General: No scleral icterus.       Right eye: No discharge.        Left eye: No discharge.     Extraocular Movements: Extraocular movements intact.     Conjunctiva/sclera: Conjunctivae normal.  Cardiovascular:     Rate and Rhythm: Normal rate and regular rhythm.     Heart sounds: Normal heart sounds. No murmur heard.    No friction rub. No gallop.  Pulmonary:     Effort: Pulmonary effort is normal. No respiratory distress.     Breath sounds: Normal breath sounds. No stridor. No wheezing, rhonchi or rales.  Musculoskeletal:     Cervical back: Normal range of motion and neck supple. No rigidity. No muscular tenderness.  Neurological:     General: No focal deficit present.     Mental Status: He is alert and oriented to person, place, and time.  Psychiatric:        Mood and Affect: Mood normal.        Behavior: Behavior normal.        Thought Content: Thought content normal.        Judgment: Judgment normal.     Assessment and Plan :   PDMP not reviewed this encounter.  1. Bacterial upper respiratory infection   2. Allergic rhinitis, unspecified seasonality, unspecified trigger    Will manage for bacterial upper respiratory infection likely worsened by allergic rhinitis.  Recommended using amoxicillin .  Start Zyrtec  and pseudoephedrine  as well.  Deferred imaging given clear cardiopulmonary exam, hemodynamically stable vital signs.  Counseled patient on  potential for adverse effects with medications prescribed/recommended today, ER and return-to-clinic precautions discussed, patient verbalized understanding.    Christopher Savannah, [place] 03/06/24 1034

## 2024-03-06 NOTE — Discharge Instructions (Signed)
 We will manage this as a bacterial upper respiratory infection with amoxicillin. For sore throat or cough try using a honey-based tea. Use 3 teaspoons of honey with juice squeezed from half lemon. Place shaved pieces of ginger into 1/2-1 cup of water and warm over stove top. Then mix the ingredients and repeat every 4 hours as needed. Please take ibuprofen 600mg  every 6 hours with food alternating with OR taken together with Tylenol 500mg -650mg  every 6 hours for throat pain, fevers, aches and pains. Hydrate very well with at least 2 liters of water. Eat light meals such as soups (chicken and noodles, vegetable, chicken and wild rice).  Do not eat foods that you are allergic to.  Taking an antihistamine like Zyrtec can help against postnasal drainage, sinus congestion which can cause sinus pain, sinus headaches, throat pain, painful swallowing, coughing.  You can take this together with pseudoephedrine (Sudafed) at a dose of 30 mg 3 times a day or twice daily as needed for the same kind of nasal drip, congestion.  Use cough medication as needed.

## 2024-03-06 NOTE — ED Triage Notes (Signed)
 Pt states he started having sore throat 1 week ago and is gone; fever 100.0 F, cough x 1 week. Ibuprofen  and Alka Seltzer gives relief.

## 2024-04-16 DIAGNOSIS — H5213 Myopia, bilateral: Secondary | ICD-10-CM | POA: Diagnosis not present

## 2024-05-11 DIAGNOSIS — H5203 Hypermetropia, bilateral: Secondary | ICD-10-CM | POA: Diagnosis not present

## 2024-05-12 DIAGNOSIS — Z125 Encounter for screening for malignant neoplasm of prostate: Secondary | ICD-10-CM | POA: Diagnosis not present

## 2024-05-12 DIAGNOSIS — Z1509 Genetic susceptibility to other malignant neoplasm: Secondary | ICD-10-CM | POA: Diagnosis not present

## 2024-05-12 DIAGNOSIS — R7309 Other abnormal glucose: Secondary | ICD-10-CM | POA: Diagnosis not present

## 2024-05-12 DIAGNOSIS — E785 Hyperlipidemia, unspecified: Secondary | ICD-10-CM | POA: Diagnosis not present

## 2024-05-12 DIAGNOSIS — G4733 Obstructive sleep apnea (adult) (pediatric): Secondary | ICD-10-CM | POA: Diagnosis not present

## 2024-05-12 DIAGNOSIS — Z23 Encounter for immunization: Secondary | ICD-10-CM | POA: Diagnosis not present

## 2024-05-27 DIAGNOSIS — M25661 Stiffness of right knee, not elsewhere classified: Secondary | ICD-10-CM | POA: Diagnosis not present

## 2024-05-27 DIAGNOSIS — Z9889 Other specified postprocedural states: Secondary | ICD-10-CM | POA: Diagnosis not present

## 2024-08-18 NOTE — Progress Notes (Signed)
 " [facility] [address] [place]. [place] [address] [phone]       OFFICE FOLLOW UP NOTE  Mr. Jacob House Date of Birth:  [birth year] Medical Record Number:  [id number]    Primary neurologist: Dr. Buck Reason for visit: CPAP follow-up  Virtual Visit via Video Note  I connected with Jacob House on 08/19/24 at  3:00 PM EST by a video enabled telemedicine application and verified that I am speaking with the correct person using two identifiers.  Location: Patient: at home Provider: in office, [facility]   I discussed the limitations of evaluation and management by telemedicine and the availability of in person appointments. The patient expressed understanding and agreed to proceed.     SUBJECTIVE:  Follow-up visit:  Prior visit: 01/01/2024  Brief HPI:   Jacob House is a 59 y.o. male who is followed for OSA on CPAP.  Initially diagnosed in 2015 after split-night study showing severe OSA with total AHI of 57.8/h and noted improvement of apneas under CPAP therapy with pressure 15.  CPAP therapy initiated 2015.  He received new CPAP machine in 08/2018.  At prior visit, noted excellent compliance and optimal residual AHI.  Due for new machine, advised to proceed with HST and set up with new machine when indicated.     Interval history:  Returns for CPAP compliance visit.  He received new machine in 04/2023, advised to call once new machine obtained but never called to schedule initial compliance visit.  He reports overall doing well with new CPAP machine.  He continues to use nasal pillows.  He has noticed more recently, his mouth is becoming more dry upon awakening.  He does have his humidity set on manual at level 3 as he found excess water in his tubing and mask when at level 5 but this was in September/October.  He does ensure the machine is lower than his head.  He has not tried to increase the humidity since that time.  He otherwise has no questions or  concerns at this time.  Continues to follow with [facility].            ROS:   14 system review of systems performed and negative with exception of those listed in HPI  PMH:  Past Medical History:  Diagnosis Date   Allergy    Arthritis    Carpal tunnel syndrome    Family history of colon cancer 06/05/2021   Family history of Lynch syndrome 06/05/2021   GERD (gastroesophageal reflux disease)    Lynch syndrome    OSA on CPAP 12/08/2013   Snoring     PSH:  Past Surgical History:  Procedure Laterality Date   ANAL FISSURE REPAIR  july 2009 and yrs ago   x 2    CHOLECYSTECTOMY  1999   COLONOSCOPY WITH PROPOFOL  N/A 07/22/2013   Procedure: COLONOSCOPY WITH PROPOFOL ;  Surgeon: Lamar LULLA Bunk, MD;  Location: [facility];  Service: Endoscopy;  Laterality: N/A;   ESOPHAGOGASTRODUODENOSCOPY (EGD) WITH PROPOFOL  N/A 07/22/2013   Procedure: ESOPHAGOGASTRODUODENOSCOPY (EGD) WITH PROPOFOL ;  Surgeon: Lamar LULLA Bunk, MD;  Location: [facility];  Service: Endoscopy;  Laterality: N/A;   EYE SURGERY Bilateral 2005   lasix   FRACTURE SURGERY Right    foot   GALLBLADDER SURGERY     KNEE ARTHROSCOPY WITH MENISCAL REPAIR Right 01/15/2022   Procedure: RIGHT KNEE MEDIAL MENISCUS ROOT REPAIR;  Surgeon: Doll Skates, MD;  Location: [facility];  Service: Orthopedics;  Laterality: Right;  KNEE ARTHROSCOPY WITH MENISCAL REPAIR Right 04/25/2022   Procedure: RIGHT KNEE MANIPULATION, ARTHROSCOPIC LYSIS OF ADHESIONS;  Surgeon: Doll Skates, MD;  Location: [facility] OR;  Service: Orthopedics;  Laterality: Right;   SHOULDER SURGERY Left sept 2014   cartlidge repair    Social History:  Social History   Socioeconomic History   Marital status: Significant Other    Spouse name: Jacob House   Number of children: 0   Years of education: 16   Highest education level: Not on file  Occupational History   Occupation: location manager    Employer: [employer]  Tobacco Use   Smoking status: Never   Smokeless  tobacco: Never  Vaping Use   Vaping status: Never Used  Substance and Sexual Activity   Alcohol use: Yes    Alcohol/week: 0.0 - 1.0 standard drinks of alcohol    Comment: rare   Drug use: No   Sexual activity: Yes    Partners: Female    Birth control/protection: Condom    Comment: 1 sexual partner in last year  Other Topics Concern   Not on file  Social History Narrative   Lives with his significant other, Jacob House, and their cats.   Social Drivers of Health   Tobacco Use: Low Risk (03/06/2024)   Patient History    Smoking Tobacco Use: Never    Smokeless Tobacco Use: Never    Passive Exposure: Not on file  Financial Resource Strain: Not on file  Food Insecurity: Not on file  Transportation Needs: Not on file  Physical Activity: Not on file  Stress: Not on file  Social Connections: Unknown (12/02/2021)   Received from [facility]   Social Network    Social Network: Not on file  Intimate Partner Violence: Unknown (11/08/2021)   Received from [facility]   HITS    Physically Hurt: Not on file    Insult or Talk Down To: Not on file    Threaten Physical Harm: Not on file    Scream or Curse: Not on file  Depression (PHQ2-9): Not on file  Alcohol Screen: Not on file  Housing: Not on file  Utilities: Not on file  Health Literacy: Not on file    Family History:  Family History  Problem Relation Age of Onset   Colon cancer Mother 76   Testicular cancer Father        d. 78   Colon cancer Maternal Uncle        d. 74   Colon cancer Maternal Uncle        dx 1s; Lynch syndrome   Heart disease Maternal Uncle    Colon cancer Maternal Grandmother        dx before 57   Breast cancer Maternal Grandmother        dx 73s   Colon cancer Maternal Grandfather        dx before 50    Medications:   Current Outpatient Medications on File Prior to Visit  Medication Sig Dispense Refill   amoxicillin  (AMOXIL ) 875 MG tablet Take 1 tablet (875 mg total) by mouth 2 (two) times daily.  20 tablet 0   aspirin-sod bicarb-citric acid (ALKA-SELTZER) 325 MG TBEF tablet Take 325 mg by mouth every 6 (six) hours as needed.     cetirizine  (ZYRTEC  ALLERGY) 10 MG tablet Take 1 tablet (10 mg total) by mouth daily. 30 tablet 0   fluticasone  (FLONASE ) 50 MCG/ACT nasal spray Place 2 sprays into both nostrils daily as needed for  allergies or rhinitis.     ibuprofen  (ADVIL ) 200 MG tablet Take 200 mg by mouth every 6 (six) hours as needed.     NON FORMULARY Pt uses a cpap nightly     promethazine -dextromethorphan (PROMETHAZINE -DM) 6.25-15 MG/5ML syrup Take 5 mLs by mouth 3 (three) times daily as needed for cough. 200 mL 0   pseudoephedrine  (SUDAFED) 30 MG tablet Take 1 tablet (30 mg total) by mouth every 8 (eight) hours as needed for congestion. 30 tablet 0   No current facility-administered medications on file prior to visit.    Allergies:   Allergies  Allergen Reactions   Amoxicillin -Pot Clavulanate Diarrhea and Nausea And Vomiting      OBJECTIVE:  Physical Exam  General: well developed, well nourished, very pleasant middle-aged male, seated, in no evident distress  Neurologic Exam Mental Status: Awake and fully alert. Oriented to place and time. Recent and remote memory intact. Attention span, concentration and fund of knowledge appropriate. Mood and affect appropriate.       ASSESSMENT/PLAN: Jacob House is a 59 y.o. year old male    OSA on CPAP :  Compliance report shows satisfactory usage with optimal residual AHI.   Continue current pressure settings at 15 with a EPR 2.   Will request he obtain a chinstrap as suspected mouth breathing contributing to dry mouth.  Also discussed adjusting humidity level especially during drier months.  Advised to call if symptoms persist. Discussed continued nightly usage with ensuring greater than 4 hours nightly for optimal benefit and per insurance purposes.   Continue to follow with DME company for any needed supplies or CPAP  related concerns CPAP set up 04/2024     Follow-up in 1 year or call earlier if needed   CC:  PCP: Marvine Rush, MD    Harlene Bogaert, AGNP-BC  [facility] [address] [place], [place] [address]  Phone [phone] Fax [phone] Note: This document was prepared with digital dictation and possible smart phrase technology. Any transcriptional errors that result from this process are unintentional.      "

## 2024-08-19 ENCOUNTER — Telehealth: Admitting: Adult Health

## 2024-08-19 ENCOUNTER — Encounter: Payer: Self-pay | Admitting: Adult Health

## 2024-08-19 DIAGNOSIS — G4733 Obstructive sleep apnea (adult) (pediatric): Secondary | ICD-10-CM

## 2024-08-19 NOTE — Progress Notes (Signed)
 Cpap orders faxed to [facility] 08/19/2024

## 2025-08-25 ENCOUNTER — Telehealth: Admitting: Adult Health
